# Patient Record
Sex: Male | Born: 1935 | Race: White | Hispanic: No | Marital: Married | Smoking: Former smoker
Health system: Southern US, Community
[De-identification: ages and names within clinical notes are randomized; demographics above are authoritative.]

## PROBLEM LIST (undated history)

## (undated) DIAGNOSIS — J449 Chronic obstructive pulmonary disease, unspecified: Secondary | ICD-10-CM

## (undated) DIAGNOSIS — I10 Essential (primary) hypertension: Secondary | ICD-10-CM

## (undated) DIAGNOSIS — E119 Type 2 diabetes mellitus without complications: Secondary | ICD-10-CM

## (undated) DIAGNOSIS — N189 Chronic kidney disease, unspecified: Secondary | ICD-10-CM

## (undated) DIAGNOSIS — M069 Rheumatoid arthritis, unspecified: Secondary | ICD-10-CM

## (undated) DIAGNOSIS — E785 Hyperlipidemia, unspecified: Secondary | ICD-10-CM

## (undated) HISTORY — DX: Essential (primary) hypertension: I10

## (undated) HISTORY — DX: Hyperlipidemia, unspecified: E78.5

## (undated) HISTORY — DX: Type 2 diabetes mellitus without complications: E11.9

## (undated) HISTORY — DX: Chronic obstructive pulmonary disease, unspecified: J44.9

## (undated) HISTORY — PX: TOE SURGERY: SHX1073

## (undated) HISTORY — DX: Chronic kidney disease, unspecified: N18.9

---

## 1999-01-28 ENCOUNTER — Emergency Department (HOSPITAL_COMMUNITY): Admission: EM | Admit: 1999-01-28 | Discharge: 1999-01-28 | Payer: Self-pay | Admitting: Emergency Medicine

## 1999-01-28 ENCOUNTER — Encounter: Payer: Self-pay | Admitting: Emergency Medicine

## 2005-04-29 ENCOUNTER — Emergency Department: Payer: Self-pay | Admitting: Emergency Medicine

## 2005-04-30 ENCOUNTER — Emergency Department: Payer: Self-pay | Admitting: Internal Medicine

## 2006-07-09 ENCOUNTER — Ambulatory Visit: Payer: Self-pay | Admitting: Internal Medicine

## 2006-08-07 ENCOUNTER — Ambulatory Visit: Payer: Self-pay | Admitting: Internal Medicine

## 2006-09-07 ENCOUNTER — Ambulatory Visit: Payer: Self-pay | Admitting: Internal Medicine

## 2009-10-08 ENCOUNTER — Emergency Department: Payer: Self-pay | Admitting: Emergency Medicine

## 2010-10-18 ENCOUNTER — Emergency Department: Payer: Self-pay | Admitting: Emergency Medicine

## 2010-12-07 ENCOUNTER — Emergency Department: Payer: Self-pay | Admitting: Emergency Medicine

## 2013-11-05 DIAGNOSIS — C439 Malignant melanoma of skin, unspecified: Secondary | ICD-10-CM | POA: Insufficient documentation

## 2014-01-27 DIAGNOSIS — N183 Chronic kidney disease, stage 3 unspecified: Secondary | ICD-10-CM | POA: Insufficient documentation

## 2014-08-11 ENCOUNTER — Telehealth: Payer: Self-pay | Admitting: Podiatry

## 2014-08-11 NOTE — Telephone Encounter (Signed)
Pt's wife called to schedule an appointment for patient due to having gout in left foot. I told the patient we could get them in the [facility] office tomorrow but he could not schedule due to having 2 other appointments already. I scheduled the patient to be seen Friday at 11:00am. Pt's wife wants to know if we could call in indomethacin 50mg  until he can come in on Friday. I stated that because he has not been seen in 4 years that he probably could not get the medication until he is seen. Pt's old chart [phone].

## 2014-08-12 NOTE — Telephone Encounter (Signed)
LEFT MESSAGE FOR PATIENT TO RETURN CALL NEED A PHARMACY NUMBER TO CALL IN PRESCRIPTION

## 2014-08-14 ENCOUNTER — Encounter: Payer: Self-pay | Admitting: Podiatry

## 2014-08-14 ENCOUNTER — Other Ambulatory Visit: Payer: Self-pay | Admitting: *Deleted

## 2014-08-14 ENCOUNTER — Ambulatory Visit (INDEPENDENT_AMBULATORY_CARE_PROVIDER_SITE_OTHER): Payer: PPO | Admitting: Podiatry

## 2014-08-14 ENCOUNTER — Ambulatory Visit (INDEPENDENT_AMBULATORY_CARE_PROVIDER_SITE_OTHER): Payer: PPO

## 2014-08-14 VITALS — BP 124/78 | HR 80 | Resp 16 | Ht 70.0 in | Wt 230.0 lb

## 2014-08-14 DIAGNOSIS — M10079 Idiopathic gout, unspecified ankle and foot: Secondary | ICD-10-CM

## 2014-08-14 DIAGNOSIS — IMO0002 Reserved for concepts with insufficient information to code with codable children: Secondary | ICD-10-CM | POA: Insufficient documentation

## 2014-08-14 DIAGNOSIS — M778 Other enthesopathies, not elsewhere classified: Secondary | ICD-10-CM

## 2014-08-14 DIAGNOSIS — J449 Chronic obstructive pulmonary disease, unspecified: Secondary | ICD-10-CM | POA: Insufficient documentation

## 2014-08-14 DIAGNOSIS — M7752 Other enthesopathy of left foot: Secondary | ICD-10-CM | POA: Diagnosis not present

## 2014-08-14 DIAGNOSIS — M779 Enthesopathy, unspecified: Secondary | ICD-10-CM

## 2014-08-14 DIAGNOSIS — I10 Essential (primary) hypertension: Secondary | ICD-10-CM | POA: Insufficient documentation

## 2014-08-14 DIAGNOSIS — M109 Gout, unspecified: Secondary | ICD-10-CM

## 2014-08-14 DIAGNOSIS — E1165 Type 2 diabetes mellitus with hyperglycemia: Secondary | ICD-10-CM | POA: Insufficient documentation

## 2014-08-14 DIAGNOSIS — E785 Hyperlipidemia, unspecified: Secondary | ICD-10-CM | POA: Insufficient documentation

## 2014-08-14 MED ORDER — INDOMETHACIN 50 MG PO CAPS
50.0000 mg | ORAL_CAPSULE | Freq: Two times a day (BID) | ORAL | Status: DC
Start: 2014-08-14 — End: 2015-03-14

## 2014-08-14 NOTE — Progress Notes (Signed)
   Subjective:    Patient ID: Isaac Stevens, male    DOB: 18-Apr-1935, 79 y.o.   MRN: [id number]  HPI I have gout. My left foot has been acting up. I am diabetic with a a1c 8.0  Need a refill on the indomethacin   Review of Systems     Objective:   Physical Exam: I have reviewed his past medical history medications allergies surgery social history and review of systems. Pulses are strongly palpable. Neurologic sensorium is intact per Semmes-Weinstein monofilament. Deep tendon reflexes are intact bilateral. Muscle strength +5 out of 5 dorsiflexor plantar flexors and inverters everters all intrinsic musculature is intact. Orthopedic evaluation demonstrates all joints distal to the ankle, full range of motion without crepitation. With exception of the first metatarsophalangeal joints bilateral right greater than left. He has pain on palpation and range of motion of the first metatarsophalangeal joint of the left foot which is mildly erythematous and edematous. 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 No other major osseous abnormalities affecting the forefoot. Cutaneous evaluation demonstrates supple well-hydrated cutis no erythema edema saline drainage or odor with exception of mild erythema overlying the first metatarsophalangeal joint and edema.        Assessment & Plan:  Assessment IVs mellitus without complications capsulitis possibly gouty arthritis secondary to history first metatarsophalangeal joint left.  Plan: Dispensed a prescription for indomethacin 25 mg 1 by mouth twice a day when necessary pain. Intra-articular injection dexamethasone after sterile Betadine skin prep 2 mg was injected hallux left. This left him asymptomatic

## 2014-08-16 ENCOUNTER — Encounter: Payer: Self-pay | Admitting: Podiatry

## 2014-08-25 ENCOUNTER — Encounter: Payer: Self-pay | Admitting: *Deleted

## 2014-08-25 NOTE — Progress Notes (Signed)
Received denial for Rx Indomethacin from insurance. Requested documentation. Sent over chart notes-will wait for response.

## 2014-08-27 NOTE — Progress Notes (Signed)
Received fax for approval for indomethacin 50 mg approved from 08/21/14 until 02/06/15

## 2014-11-20 NOTE — Progress Notes (Signed)
Entered in error

## 2015-02-18 DIAGNOSIS — M0579 Rheumatoid arthritis with rheumatoid factor of multiple sites without organ or systems involvement: Secondary | ICD-10-CM | POA: Diagnosis not present

## 2015-02-18 DIAGNOSIS — M79641 Pain in right hand: Secondary | ICD-10-CM | POA: Diagnosis not present

## 2015-02-18 DIAGNOSIS — Z79899 Other long term (current) drug therapy: Secondary | ICD-10-CM | POA: Diagnosis not present

## 2015-03-01 DIAGNOSIS — H2513 Age-related nuclear cataract, bilateral: Secondary | ICD-10-CM | POA: Diagnosis not present

## 2015-03-01 DIAGNOSIS — E119 Type 2 diabetes mellitus without complications: Secondary | ICD-10-CM | POA: Diagnosis not present

## 2015-03-04 DIAGNOSIS — E1165 Type 2 diabetes mellitus with hyperglycemia: Secondary | ICD-10-CM | POA: Diagnosis not present

## 2015-03-04 DIAGNOSIS — J449 Chronic obstructive pulmonary disease, unspecified: Secondary | ICD-10-CM | POA: Diagnosis not present

## 2015-03-04 DIAGNOSIS — I10 Essential (primary) hypertension: Secondary | ICD-10-CM | POA: Diagnosis not present

## 2015-03-04 DIAGNOSIS — E118 Type 2 diabetes mellitus with unspecified complications: Secondary | ICD-10-CM | POA: Diagnosis not present

## 2015-03-04 DIAGNOSIS — K121 Other forms of stomatitis: Secondary | ICD-10-CM | POA: Diagnosis not present

## 2015-03-08 DIAGNOSIS — B379 Candidiasis, unspecified: Secondary | ICD-10-CM | POA: Diagnosis not present

## 2015-03-08 DIAGNOSIS — R070 Pain in throat: Secondary | ICD-10-CM | POA: Diagnosis not present

## 2015-03-11 ENCOUNTER — Inpatient Hospital Stay: Payer: PPO

## 2015-03-11 ENCOUNTER — Inpatient Hospital Stay
Admission: AD | Admit: 2015-03-11 | Discharge: 2015-03-14 | DRG: 205 | Disposition: A | Discharge status: Home / Self Care | Payer: PPO | Source: Ambulatory Visit | Attending: Internal Medicine | Admitting: Internal Medicine

## 2015-03-11 DIAGNOSIS — R0902 Hypoxemia: Secondary | ICD-10-CM | POA: Diagnosis not present

## 2015-03-11 DIAGNOSIS — E119 Type 2 diabetes mellitus without complications: Secondary | ICD-10-CM | POA: Diagnosis not present

## 2015-03-11 DIAGNOSIS — E1165 Type 2 diabetes mellitus with hyperglycemia: Secondary | ICD-10-CM | POA: Diagnosis not present

## 2015-03-11 DIAGNOSIS — Z716 Tobacco abuse counseling: Secondary | ICD-10-CM | POA: Diagnosis not present

## 2015-03-11 DIAGNOSIS — F17220 Nicotine dependence, chewing tobacco, uncomplicated: Secondary | ICD-10-CM | POA: Diagnosis present

## 2015-03-11 DIAGNOSIS — J9601 Acute respiratory failure with hypoxia: Secondary | ICD-10-CM | POA: Diagnosis present

## 2015-03-11 DIAGNOSIS — M069 Rheumatoid arthritis, unspecified: Secondary | ICD-10-CM | POA: Diagnosis present

## 2015-03-11 DIAGNOSIS — N183 Chronic kidney disease, stage 3 (moderate): Secondary | ICD-10-CM | POA: Diagnosis present

## 2015-03-11 DIAGNOSIS — E785 Hyperlipidemia, unspecified: Secondary | ICD-10-CM | POA: Diagnosis not present

## 2015-03-11 DIAGNOSIS — I129 Hypertensive chronic kidney disease with stage 1 through stage 4 chronic kidney disease, or unspecified chronic kidney disease: Secondary | ICD-10-CM | POA: Diagnosis present

## 2015-03-11 DIAGNOSIS — Z7984 Long term (current) use of oral hypoglycemic drugs: Secondary | ICD-10-CM

## 2015-03-11 DIAGNOSIS — J441 Chronic obstructive pulmonary disease with (acute) exacerbation: Secondary | ICD-10-CM | POA: Diagnosis present

## 2015-03-11 DIAGNOSIS — R634 Abnormal weight loss: Secondary | ICD-10-CM | POA: Diagnosis not present

## 2015-03-11 DIAGNOSIS — T451X5A Adverse effect of antineoplastic and immunosuppressive drugs, initial encounter: Secondary | ICD-10-CM | POA: Diagnosis present

## 2015-03-11 DIAGNOSIS — J702 Acute drug-induced interstitial lung disorders: Principal | ICD-10-CM | POA: Diagnosis present

## 2015-03-11 DIAGNOSIS — B370 Candidal stomatitis: Secondary | ICD-10-CM | POA: Diagnosis not present

## 2015-03-11 DIAGNOSIS — Z6831 Body mass index (BMI) 31.0-31.9, adult: Secondary | ICD-10-CM | POA: Diagnosis not present

## 2015-03-11 DIAGNOSIS — Z8042 Family history of malignant neoplasm of prostate: Secondary | ICD-10-CM | POA: Diagnosis not present

## 2015-03-11 DIAGNOSIS — R0602 Shortness of breath: Secondary | ICD-10-CM | POA: Diagnosis not present

## 2015-03-11 DIAGNOSIS — J029 Acute pharyngitis, unspecified: Secondary | ICD-10-CM | POA: Diagnosis present

## 2015-03-11 DIAGNOSIS — Z888 Allergy status to other drugs, medicaments and biological substances status: Secondary | ICD-10-CM

## 2015-03-11 DIAGNOSIS — B379 Candidiasis, unspecified: Secondary | ICD-10-CM | POA: Diagnosis not present

## 2015-03-11 DIAGNOSIS — Z79899 Other long term (current) drug therapy: Secondary | ICD-10-CM

## 2015-03-11 DIAGNOSIS — M1990 Unspecified osteoarthritis, unspecified site: Secondary | ICD-10-CM | POA: Diagnosis present

## 2015-03-11 DIAGNOSIS — E1122 Type 2 diabetes mellitus with diabetic chronic kidney disease: Secondary | ICD-10-CM | POA: Diagnosis not present

## 2015-03-11 DIAGNOSIS — E118 Type 2 diabetes mellitus with unspecified complications: Secondary | ICD-10-CM | POA: Diagnosis not present

## 2015-03-11 DIAGNOSIS — I10 Essential (primary) hypertension: Secondary | ICD-10-CM | POA: Diagnosis not present

## 2015-03-11 DIAGNOSIS — Z7951 Long term (current) use of inhaled steroids: Secondary | ICD-10-CM

## 2015-03-11 DIAGNOSIS — J449 Chronic obstructive pulmonary disease, unspecified: Secondary | ICD-10-CM | POA: Diagnosis not present

## 2015-03-11 DIAGNOSIS — J189 Pneumonia, unspecified organism: Secondary | ICD-10-CM | POA: Diagnosis present

## 2015-03-11 DIAGNOSIS — R0600 Dyspnea, unspecified: Secondary | ICD-10-CM

## 2015-03-11 HISTORY — DX: Rheumatoid arthritis, unspecified: M06.9

## 2015-03-11 LAB — BASIC METABOLIC PANEL
Anion gap: 6 (ref 5–15)
BUN: 19 mg/dL (ref 6–20)
CO2: 25 mmol/L (ref 22–32)
CREATININE: 1.56 mg/dL — AB (ref 0.61–1.24)
Calcium: 9 mg/dL (ref 8.9–10.3)
Chloride: 103 mmol/L (ref 101–111)
GFR calc Af Amer: 48 mL/min — ABNORMAL LOW (ref >60)
GFR, EST NON AFRICAN AMERICAN: 41 mL/min — AB (ref >60)
GLUCOSE: 89 mg/dL (ref 65–99)
POTASSIUM: 4.6 mmol/L (ref 3.5–5.1)
SODIUM: 134 mmol/L — AB (ref 135–145)

## 2015-03-11 LAB — CBC
HCT: 33.1 % — ABNORMAL LOW (ref 40.0–52.0)
Hemoglobin: 11.2 g/dL — ABNORMAL LOW (ref 13.0–18.0)
MCH: 32.4 pg (ref 26.0–34.0)
MCHC: 33.8 g/dL (ref 32.0–36.0)
MCV: 95.7 fL (ref 80.0–100.0)
PLATELETS: 325 10*3/uL (ref 150–440)
RBC: 3.46 MIL/uL — AB (ref 4.40–5.90)
RDW: 15.6 % — ABNORMAL HIGH (ref 11.5–14.5)
WBC: 12 10*3/uL — ABNORMAL HIGH (ref 3.8–10.6)

## 2015-03-11 LAB — BLOOD GAS, ARTERIAL
ACID-BASE EXCESS: 0.8 mmol/L (ref 0.0–3.0)
Allens test (pass/fail): POSITIVE — AB
BICARBONATE: 24.2 meq/L (ref 21.0–28.0)
FIO2: 21
O2 SAT: 85.1 %
PATIENT TEMPERATURE: 37
PCO2 ART: 34 mmHg (ref 32.0–48.0)
PO2 ART: 47 mmHg — AB (ref 83.0–108.0)
pH, Arterial: 7.46 — ABNORMAL HIGH (ref 7.350–7.450)

## 2015-03-11 LAB — GLUCOSE, CAPILLARY
GLUCOSE-CAPILLARY: 71 mg/dL (ref 65–99)
GLUCOSE-CAPILLARY: 85 mg/dL (ref 65–99)

## 2015-03-11 MED ORDER — IPRATROPIUM-ALBUTEROL 0.5-2.5 (3) MG/3ML IN SOLN
3.0000 mL | RESPIRATORY_TRACT | Status: DC
  Administered 2015-03-11 – 2015-03-14 (×13): 3 mL via RESPIRATORY_TRACT
  Filled 2015-03-11 (×15): qty 3

## 2015-03-11 MED ORDER — METHYLPREDNISOLONE SODIUM SUCC 125 MG IJ SOLR
60.0000 mg | Freq: Three times a day (TID) | INTRAMUSCULAR | Status: DC
  Administered 2015-03-11 – 2015-03-12 (×2): 60 mg via INTRAVENOUS
  Filled 2015-03-11 (×3): qty 2

## 2015-03-11 MED ORDER — GLIMEPIRIDE 2 MG PO TABS
4.0000 mg | ORAL_TABLET | Freq: Two times a day (BID) | ORAL | Status: DC
  Administered 2015-03-12: 4 mg via ORAL
  Filled 2015-03-11 (×2): qty 2

## 2015-03-11 MED ORDER — METFORMIN HCL 500 MG PO TABS: 1000.0000 mg | ORAL_TABLET | Freq: Two times a day (BID) | ORAL | Status: DC

## 2015-03-11 MED ORDER — HYDROCODONE-ACETAMINOPHEN 5-325 MG PO TABS: 1.0000 | ORAL_TABLET | ORAL | Status: DC | PRN

## 2015-03-11 MED ORDER — ACETAMINOPHEN 650 MG RE SUPP: 650.0000 mg | Freq: Four times a day (QID) | RECTAL | Status: DC | PRN

## 2015-03-11 MED ORDER — ACETAMINOPHEN 325 MG PO TABS: 650.0000 mg | ORAL_TABLET | Freq: Four times a day (QID) | ORAL | Status: DC | PRN

## 2015-03-11 MED ORDER — ONDANSETRON HCL 4 MG/2ML IJ SOLN: 4.0000 mg | Freq: Four times a day (QID) | INTRAMUSCULAR | Status: DC | PRN

## 2015-03-11 MED ORDER — INSULIN ASPART 100 UNIT/ML ~~LOC~~ SOLN
0.0000 [IU] | Freq: Three times a day (TID) | SUBCUTANEOUS | Status: DC
  Administered 2015-03-12: 12:00:00 9 [IU] via SUBCUTANEOUS
  Administered 2015-03-12: 08:00:00 7 [IU] via SUBCUTANEOUS
  Administered 2015-03-12: 17:00:00 9 [IU] via SUBCUTANEOUS
  Administered 2015-03-13: 12:00:00 7 [IU] via SUBCUTANEOUS
  Administered 2015-03-13: 9 [IU] via SUBCUTANEOUS
  Administered 2015-03-13: 3 [IU] via SUBCUTANEOUS
  Administered 2015-03-14: 1 [IU] via SUBCUTANEOUS
  Filled 2015-03-11: qty 3
  Filled 2015-03-11: qty 9
  Filled 2015-03-11: qty 1
  Filled 2015-03-11 (×2): qty 7
  Filled 2015-03-11 (×2): qty 9

## 2015-03-11 MED ORDER — LINAGLIPTIN 5 MG PO TABS
5.0000 mg | ORAL_TABLET | Freq: Every day | ORAL | Status: DC
  Administered 2015-03-12 – 2015-03-14 (×3): 5 mg via ORAL
  Filled 2015-03-11 (×4): qty 1

## 2015-03-11 MED ORDER — AMLODIPINE BESYLATE 5 MG PO TABS
5.0000 mg | ORAL_TABLET | Freq: Every day | ORAL | Status: DC
  Administered 2015-03-12 – 2015-03-14 (×3): 5 mg via ORAL
  Filled 2015-03-11 (×4): qty 1

## 2015-03-11 MED ORDER — IOHEXOL 350 MG/ML SOLN
75.0000 mL | Freq: Once | INTRAVENOUS | Status: AC | PRN
  Administered 2015-03-11: 20:00:00 75 mL via INTRAVENOUS

## 2015-03-11 MED ORDER — BUDESONIDE-FORMOTEROL FUMARATE 160-4.5 MCG/ACT IN AERO
2.0000 | INHALATION_SPRAY | Freq: Two times a day (BID) | RESPIRATORY_TRACT | Status: DC
  Administered 2015-03-11 – 2015-03-14 (×6): 2 via RESPIRATORY_TRACT
  Filled 2015-03-11: qty 6

## 2015-03-11 MED ORDER — ENOXAPARIN SODIUM 40 MG/0.4ML ~~LOC~~ SOLN
40.0000 mg | SUBCUTANEOUS | Status: DC
  Administered 2015-03-12 – 2015-03-13 (×2): 40 mg via SUBCUTANEOUS
  Filled 2015-03-11 (×2): qty 0.4

## 2015-03-11 MED ORDER — INSULIN ASPART 100 UNIT/ML ~~LOC~~ SOLN
0.0000 [IU] | Freq: Every day | SUBCUTANEOUS | Status: DC
Start: 2015-03-11 — End: 2015-03-14
  Administered 2015-03-12: 23:00:00 5 [IU] via SUBCUTANEOUS
  Administered 2015-03-13: 22:00:00 3 [IU] via SUBCUTANEOUS
  Filled 2015-03-11: qty 3
  Filled 2015-03-11: qty 5

## 2015-03-11 MED ORDER — INDOMETHACIN 50 MG PO CAPS: 50.0000 mg | ORAL_CAPSULE | Freq: Two times a day (BID) | ORAL | Status: DC

## 2015-03-11 MED ORDER — ONDANSETRON HCL 4 MG PO TABS
4.0000 mg | ORAL_TABLET | Freq: Four times a day (QID) | ORAL | Status: DC | PRN
Start: 2015-03-11 — End: 2015-03-14

## 2015-03-11 NOTE — H&P (Signed)
[facility] at [place] NAME: Isaac Stevens    MR#:  [id number]  DATE OF BIRTH:  07-19-1935  DATE OF ADMISSION:  03/11/2015  PRIMARY CARE PHYSICIAN: Adin Hector, MD   REQUESTING/REFERRING PHYSICIAN: Dr. Ramonita Lab  CHIEF COMPLAINT:  No chief complaint on file. Dyspnea  HISTORY OF PRESENT ILLNESS:  Isaac Stevens  is a 80 y.o. male with a known history of CKD stage III, diabetes, hypertension and hyperlipidemia and recent diagnosis of arthritis and started on methotrexate about 2 months ago was sent in from PCPs office due to worsening hypoxia and dyspnea for 2 weeks now. The patient is very active at baseline, he has diagnosis of mild COPD, not on any home oxygen. Usually well controlled with his home inhalers. He was a former smoker and currently chews tobacco. He was recently diagnosed with arthritis and started on methotrexate in November 2016. Since then he was having issues with sore throat and sinus complaints. Over the last 2 weeks he's been having trouble breathing. Even minimal exertion was causing him to be extremely dyspneic. Denies any cough, no fevers or chills. No nausea or vomiting noted. He also was complaining of orthopnea and smothering at night time and was sleeping in a recliner. Symptoms have been worsening over the last couple of weeks, he remains confused and mostly sleeping during the daytime which is very unusual of him. Family brought pulse ox meter and check that his sats were 84% on room air and try to use nebulizer at home with not significant improvement. He was seen by his PCP in the office today and was sent in for concerns of dyspnea and worsening hypoxia.  PAST MEDICAL HISTORY:   Past Medical History  Diagnosis Date  . Hyperlipidemia   . Diabetes mellitus without complication ([place])   . Hypertension   . CAFL (chronic airflow limitation) ([facility])   . CKD (chronic kidney disease)   . Rheumatoid arthritis  ([place])     PAST SURGICAL HISTORY:   Past Surgical History  Procedure Laterality Date  . Toe surgery      right foot    SOCIAL HISTORY:   Social History  Substance Use Topics  . Smoking status: Former Research scientist (life sciences)  . Smokeless tobacco: Current User    Types: Chew     Comment: quit 20 years ago , chews tobacco  . Alcohol Use: No    FAMILY HISTORY:   Family History  Problem Relation Age of Onset  . Prostate cancer Father     DRUG ALLERGIES:   Allergies  Allergen Reactions  . Methotrexate Derivatives Shortness Of Breath  . Lisinopril Swelling  . Pseudoephedrine Hcl Other (See Comments)    confusion    REVIEW OF SYSTEMS:   Review of Systems  Constitutional: Positive for malaise/fatigue. Negative for fever, chills and weight loss.  HENT: Negative for ear discharge, ear pain, hearing loss, nosebleeds and tinnitus.   Eyes: Negative for blurred vision, double vision and photophobia.  Respiratory: Positive for shortness of breath. Negative for cough, hemoptysis and wheezing.   Cardiovascular: Negative for chest pain, palpitations, orthopnea and leg swelling.  Gastrointestinal: Negative for heartburn, nausea, vomiting, abdominal pain, diarrhea, constipation and melena.  Genitourinary: Negative for dysuria, urgency, frequency and hematuria.  Musculoskeletal: Negative for myalgias, back pain and neck pain.  Skin: Negative for rash.  Neurological: Negative for dizziness, tingling, tremors, sensory change, speech change, focal weakness and headaches.  Drowsy and confusion  Endo/Heme/Allergies: Does not bruise/bleed easily.  Psychiatric/Behavioral: Negative for depression.    MEDICATIONS AT HOME:   Prior to Admission medications   Medication Sig Start Date End Date Taking? Authorizing Provider  albuterol-ipratropium (COMBIVENT) 18-103 MCG/ACT inhaler Inhale into the lungs every 4 (four) hours.   Yes Historical Provider, MD  amLODipine (NORVASC) 5 MG tablet TAKE 1 TABLET BY  MOUTH EVERY DAY 04/23/14  Yes Historical Provider, MD  budesonide-formoterol (SYMBICORT) 160-4.5 MCG/ACT inhaler Inhale 2 puffs into the lungs 2 (two) times daily.   Yes Historical Provider, MD  EPIPEN 2-PAK 0.3 MG/0.3ML SOAJ injection See admin instructions. 07/31/14  Yes Historical Provider, MD  glimepiride (AMARYL) 4 MG tablet TAKE 1 TABLET BY MOUTH TWICE A DAY 04/29/14  Yes Historical Provider, MD  JANUVIA 100 MG tablet TAKE 1 TABLET (100 MG TOTAL) BY MOUTH ONCE DAILY. 06/03/14  Yes Historical Provider, MD  indomethacin (INDOCIN) 50 MG capsule Take 1 capsule (50 mg total) by mouth 2 (two) times daily with a meal. Patient not taking: Reported on 03/11/2015 08/14/14   Max T Hyatt, DPM  metFORMIN (GLUCOPHAGE) 1000 MG tablet Reported on 03/11/2015 04/23/14   Historical Provider, MD      VITAL SIGNS:  Blood pressure 135/64, pulse 87, temperature 99.4 F (37.4 C), temperature source Oral, resp. rate 20, SpO2 95 %.  PHYSICAL EXAMINATION:   Physical Exam  GENERAL:  80 y.o.-year-old patient lying in the bed with no acute distress.  EYES: Pupils equal, round, reactive to light and accommodation. No scleral icterus. Extraocular muscles intact.  HEENT: Head atraumatic, normocephalic. Oropharynx and nasopharynx clear.  NECK:  Supple, no jugular venous distention. No thyroid enlargement, no tenderness.  LUNGS: Normal breath sounds bilaterally, no wheezing, rales,rhonchi or crepitation. No use of accessory muscles of respiration. Decreased bibasilar breath sounds CARDIOVASCULAR: S1, S2 normal. No murmurs, rubs, or gallops.  ABDOMEN: Soft, nontender, nondistended. Bowel sounds present. No organomegaly or mass.  EXTREMITIES: No pedal edema, cyanosis, or clubbing.  NEUROLOGIC: Cranial nerves II through XII are intact. Muscle strength 5/5 in all extremities. Sensation intact. Gait not checked.  PSYCHIATRIC: The patient is alert and oriented x 3. Intermittent confusion noted. SKIN: No obvious rash, lesion, or  ulcer.   LABORATORY PANEL:   CBC No results for input(s): WBC, HGB, HCT, PLT in the last 168 hours. ------------------------------------------------------------------------------------------------------------------  Chemistries  No results for input(s): NA, K, CL, CO2, GLUCOSE, BUN, CREATININE, CALCIUM, MG, AST, ALT, ALKPHOS, BILITOT in the last 168 hours.  Invalid input(s): GFRCGP ------------------------------------------------------------------------------------------------------------------  Cardiac Enzymes No results for input(s): TROPONINI in the last 168 hours. ------------------------------------------------------------------------------------------------------------------  RADIOLOGY:  No results found.  EKG:   Orders placed or performed in visit on 12/07/10  . EKG 12-Lead  . EKG 12-Lead    IMPRESSION AND PLAN:   Isaac Stevens  is a 80 y.o. male with a known history of CKD stage III, diabetes, hypertension and hyperlipidemia and recent diagnosis of arthritis and started on methotrexate about 2 months ago was sent in from PCPs office due to worsening hypoxia and dyspnea for 2 weeks now.  #1 dyspnea with hypoxia-also complains of orthopnea going on for 2 weeks now. -Exam does not reveal any significant wheezing or crackles. No cough or fevers to indicate any bronchitis or pneumonia. -We'll get CT angiogram to rule out any pulmonary embolism. Not sure if this is a side effect from his methotrexate since all his symptoms started after using methotrexate. -We'll hold off methotrexate at this time -  2-D echocardiogram ordered especially with his symptoms of orthopnea. -ABG and supplemental oxygen as needed. -IV steroids added. - cont home inhalers and duonebs  #2 hypertension-continue amlodipine  #3 diabetes mellitus-check A1c. On sliding scale insulin. -Continue glimepiride and Januvia. Metformin held due to CT with contrast today.  #4 DVT prophylaxis-on  Lovenox   All the records are reviewed and case discussed with ED provider. Management plans discussed with the patient, family and they are in agreement.  CODE STATUS: Full Code  TOTAL TIME TAKING CARE OF THIS PATIENT: 50 minutes.    Gladstone Lighter M.D on 03/11/2015 at 5:12 PM  Between 7am to 6pm - Pager - [phone]  After 6pm go to [email or web address] - password [email or web address] [facility]  [place] [facility]  Office  [phone]  CC: Primary care physician; Adin Hector, MD

## 2015-03-12 ENCOUNTER — Inpatient Hospital Stay (HOSPITAL_COMMUNITY)
Admission: AD | Admit: 2015-03-12 | Discharge: 2015-03-12 | Disposition: A | Discharge status: Home / Self Care | Payer: PPO | Source: Ambulatory Visit | Attending: Internal Medicine | Admitting: Internal Medicine

## 2015-03-12 DIAGNOSIS — R0600 Dyspnea, unspecified: Secondary | ICD-10-CM | POA: Diagnosis not present

## 2015-03-12 LAB — BASIC METABOLIC PANEL
Anion gap: 12 (ref 5–15)
BUN: 22 mg/dL — AB (ref 6–20)
CO2: 22 mmol/L (ref 22–32)
Calcium: 9 mg/dL (ref 8.9–10.3)
Chloride: 102 mmol/L (ref 101–111)
Creatinine, Ser: 1.61 mg/dL — ABNORMAL HIGH (ref 0.61–1.24)
GFR, EST AFRICAN AMERICAN: 46 mL/min — AB (ref >60)
GFR, EST NON AFRICAN AMERICAN: 40 mL/min — AB (ref >60)
Glucose, Bld: 291 mg/dL — ABNORMAL HIGH (ref 65–99)
POTASSIUM: 4.4 mmol/L (ref 3.5–5.1)
SODIUM: 136 mmol/L (ref 135–145)

## 2015-03-12 LAB — HEMOGLOBIN A1C: Hgb A1c MFr Bld: 7.5 % — ABNORMAL HIGH (ref 4.0–6.0)

## 2015-03-12 LAB — CBC
HEMATOCRIT: 33.8 % — AB (ref 40.0–52.0)
Hemoglobin: 11.3 g/dL — ABNORMAL LOW (ref 13.0–18.0)
MCH: 32.7 pg (ref 26.0–34.0)
MCHC: 33.4 g/dL (ref 32.0–36.0)
MCV: 97.9 fL (ref 80.0–100.0)
PLATELETS: 283 10*3/uL (ref 150–440)
RBC: 3.45 MIL/uL — AB (ref 4.40–5.90)
RDW: 15.3 % — AB (ref 11.5–14.5)
WBC: 7.4 10*3/uL (ref 3.8–10.6)

## 2015-03-12 LAB — GLUCOSE, CAPILLARY
GLUCOSE-CAPILLARY: 343 mg/dL — AB (ref 65–99)
Glucose-Capillary: 284 mg/dL — ABNORMAL HIGH (ref 65–99)
Glucose-Capillary: 386 mg/dL — ABNORMAL HIGH (ref 65–99)
Glucose-Capillary: 393 mg/dL — ABNORMAL HIGH (ref 65–99)
Glucose-Capillary: 364 mg/dL — ABNORMAL HIGH (ref 65–99)

## 2015-03-12 MED ORDER — INSULIN GLARGINE 100 UNIT/ML ~~LOC~~ SOLN
10.0000 [IU] | Freq: Every day | SUBCUTANEOUS | Status: DC
Start: 2015-03-12 — End: 2015-03-13
  Administered 2015-03-12: 23:00:00 10 [IU] via SUBCUTANEOUS
  Filled 2015-03-12: qty 0.1

## 2015-03-12 MED ORDER — METHYLPREDNISOLONE SODIUM SUCC 125 MG IJ SOLR
60.0000 mg | INTRAMUSCULAR | Status: DC
  Administered 2015-03-12: 13:00:00 60 mg via INTRAVENOUS
  Filled 2015-03-12: qty 2

## 2015-03-12 MED ORDER — INSULIN GLARGINE 100 UNIT/ML ~~LOC~~ SOLN
15.0000 [IU] | Freq: Every day | SUBCUTANEOUS | Status: DC
  Administered 2015-03-12: 23:00:00 10 [IU] via SUBCUTANEOUS
  Filled 2015-03-12: qty 0.15

## 2015-03-12 MED ORDER — LEVOFLOXACIN 500 MG PO TABS
250.0000 mg | ORAL_TABLET | Freq: Every day | ORAL | Status: DC
  Administered 2015-03-13 – 2015-03-14 (×2): 250 mg via ORAL
  Filled 2015-03-12 (×2): qty 1

## 2015-03-12 MED ORDER — LEVOFLOXACIN 500 MG PO TABS
500.0000 mg | ORAL_TABLET | Freq: Once | ORAL | Status: AC
  Administered 2015-03-12: 500 mg via ORAL
  Filled 2015-03-12: qty 1

## 2015-03-12 NOTE — Care Management (Signed)
Admitted to [facility] with the diagnosis of COPD. Lives with wife, Britt Boozer [phone] or [phone]). Last seen Dr. Caryl Comes in the office yesterday. No home Health. No skilled facility. No home oxygen. Uses no aids for ambulation. Raised toilet seat, bedside commode, and rolling walker in the home, if needed. Takes care of all basic and instrumental needs himself, drives. No falls, Decreased appetite.  Wife/family will transport. Shelbie Ammons RN MSN CCM Care Management [phone]

## 2015-03-12 NOTE — Progress Notes (Signed)
[place] at [place] NAME: Isaac Stevens    MR#:  [id number]  DATE OF BIRTH:  1935/07/25  SUBJECTIVE:  CHIEF COMPLAINT:  No chief complaint on file.  Admitted from home due to persistent hypoxia.  - now requiring 4 L oxygen. CT chest negative for PE. 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 Improved dyspnea.  REVIEW OF SYSTEMS:  Review of Systems  Constitutional: Negative for fever and chills.  Respiratory: Positive for cough and shortness of breath. Negative for wheezing.   Cardiovascular: Negative for chest pain and palpitations.  Gastrointestinal: Negative for nausea, vomiting, abdominal pain, diarrhea and constipation.  Genitourinary: Negative for dysuria.  Neurological: Positive for weakness. Negative for dizziness, sensory change, speech change, focal weakness, seizures and headaches.  Psychiatric/Behavioral: Negative for depression.    DRUG ALLERGIES:   Allergies  Allergen Reactions  . Methotrexate Derivatives Shortness Of Breath  . Lisinopril Swelling  . Pseudoephedrine Hcl Other (See Comments)    confusion    VITALS:  Blood pressure 111/58, pulse 73, temperature 98.2 F (36.8 C), temperature source Oral, resp. rate 17, height 5\' 10"  (1.778 m), weight 98.657 kg (217 lb 8 oz), SpO2 96 %.  PHYSICAL EXAMINATION:  Physical Exam  GENERAL: 80 y.o.-year-old patient lying in the bed with no acute distress.  EYES: Pupils equal, round, reactive to light and accommodation. No scleral icterus. Extraocular muscles intact.  HEENT: Head atraumatic, normocephalic. Oropharynx and nasopharynx clear.  NECK: Supple, no jugular venous distention. No thyroid enlargement, no tenderness.  LUNGS: Normal breath sounds bilaterally, no wheezing, rales,rhonchi or crepitation. No use of accessory muscles of respiration. Decreased bibasilar breath sounds CARDIOVASCULAR: S1, S2  normal. No murmurs, rubs, or gallops.  ABDOMEN: Soft, nontender, nondistended. Bowel sounds present. No organomegaly or mass.  EXTREMITIES: No pedal edema, cyanosis, or clubbing.  NEUROLOGIC: Cranial nerves II through XII are intact. Muscle strength 5/5 in all extremities. Sensation intact. Gait not checked.  PSYCHIATRIC: The patient is alert and oriented x 3. SKIN: No obvious rash, lesion, or ulcer.    LABORATORY PANEL:   CBC  Recent Labs Lab 03/12/15 0609  WBC 7.4  HGB 11.3*  HCT 33.8*  PLT 283   ------------------------------------------------------------------------------------------------------------------  Chemistries   Recent Labs Lab 03/12/15 0609  NA 136  K 4.4  CL 102  CO2 22  GLUCOSE 291*  BUN 22*  CREATININE 1.61*  CALCIUM 9.0   ------------------------------------------------------------------------------------------------------------------  Cardiac Enzymes No results for input(s): TROPONINI in the last 168 hours. ------------------------------------------------------------------------------------------------------------------  RADIOLOGY:  Dg Chest 2 View  03/11/2015  CLINICAL DATA:  Worsening shortness of Breath EXAM: CHEST  2 VIEW COMPARISON:  None FINDINGS: The heart size and mediastinal contours are within normal limits. Both lungs are clear. The visualized skeletal structures are unremarkable. IMPRESSION: No active cardiopulmonary disease. Electronically Signed   By: Kerby Moors M.D.   On: 03/11/2015 17:35   Ct Angio Chest Pe W/cm &/or Wo Cm  03/11/2015  CLINICAL DATA:  80 y.o. male with a known history of CKD stage III, diabetes, hypertension and hyperlipidemia and recent diagnosis of arthritis and started on methotrexate about 2 months ago was sent in from PCPs office due to worsening hypoxia and dyspnea for 2 weeks now. EXAM: CT ANGIOGRAPHY CHEST WITH CONTRAST TECHNIQUE: Multidetector CT imaging of the chest was performed using the standard  protocol during bolus administration of intravenous contrast. Multiplanar CT image reconstructions and MIPs were obtained to evaluate  the vascular anatomy. CONTRAST:  79mL OMNIPAQUE IOHEXOL 350 MG/ML SOLN COMPARISON:  Current chest radiographs FINDINGS: Angiographic study: No evidence of a pulmonary embolus. Aorta demonstrates mild atherosclerotic calcifications. Aorta is mildly dilated with the ascending portion measuring 4 mm in diameter. Common origin of the innominate artery and left common carotid artery from the aortic arch. No aortic dissection. Neck base and axilla: No mass or adenopathy. Thyroid is unremarkable. Mediastinum and hila: Heart normal in size and configuration. Moderate coronary artery calcifications. There are several prominent lymph nodes, largest a precarinal node measuring 1 cm in short axis. There is enlarged right infrahilar node measuring 13 mm in short axis. Lungs and pleura: Patchy areas of ground-glass opacity are noted bilaterally, more prominent on the right than the left. There is mild interstitial thickening. Minor centrilobular and paraseptal emphysema is noted in the upper lobes, greater on the right. No pleural effusion. No pneumothorax. Limited upper abdomen: Liver shows central volume loss with some surface nodularity consistent with cirrhosis. No liver mass or focal lesion. Visualized spleen is mildly enlarged measuring 13 cm in short axis. Musculoskeletal:  No osteoblastic or osteolytic lesions. Review of the MIP images confirms the above findings. IMPRESSION: 1. No evidence of a pulmonary embolism. 2. Bilateral patchy areas of ground-glass lung opacity are noted, right greater than left. This could reflect pneumonia, particularly atypical pneumonia. Given the history, drug toxicity should be considered. Electronically Signed   By: Lajean Manes M.D.   On: 03/11/2015 20:28    EKG:   Orders placed or performed in visit on 12/07/10  . EKG 12-Lead  . EKG 12-Lead     ASSESSMENT AND PLAN:   Isaac Stevens is a 80 y.o. male with a known history of CKD stage III, diabetes, hypertension and hyperlipidemia and recent diagnosis of arthritis and started on methotrexate about 2 months ago was sent in from PCPs office due to worsening hypoxia and dyspnea for 2 weeks now.  #1 acute hypoxic respiratory failure-  currently on 4 L oxygen. Patient did not have any home oxygen prior to this before. -CT of the chest-with no pulmonary embolism. However bilateral patchy areas of groundglass opacities noted.. Could be pneumonitis versus  side effect from his methotrexate since all his symptoms started after using methotrexate. -Discontinued methotrexate at this time -2-D echocardiogram ordered especially with his symptoms of orthopnea. -ABG with hypoxia but no CO2 retention. Started on IV steroids, DuoNeb's -Added Levaquin today. Pulmonary consult.  #2 hypertension-continue amlodipine  #3 diabetes mellitus- A1c 7.5. On sliding scale insulin. -Continue glimepiride and Januvia. Metformin held due to CT with contrast yesterday. -Sugars are elevated due to being on steroids. Added Lantus at bedtime  #4 DVT prophylaxis-on Lovenox   Appreciate physical therapy consult. No PT needs identified.   All the records are reviewed and case discussed with Care Management/Social Workerr. Management plans discussed with the patient, family and they are in agreement.  CODE STATUS: Full Code  TOTAL TIME TAKING CARE OF THIS PATIENT: 37 minutes.   POSSIBLE D/C IN 1-2 DAYS, DEPENDING ON CLINICAL CONDITION.   Annella Prowell M.D on 03/12/2015 at 12:40 PM  Between 7am to 6pm - Pager - [phone]  After 6pm go to [email or web address] - password [email or web address] [facility]  [place] [facility]  Office  [phone]  CC: Primary care physician; Adin Hector, MD

## 2015-03-12 NOTE — Progress Notes (Signed)
Initial Nutrition Assessment   INTERVENTION:   Meals and Snacks: Cater to patient preferences on current diet order Medical Food Supplement Therapy: pt not interested in supplement drinks currently and po intake improved per report. If po intake poor on follow, will readdress.   NUTRITION DIAGNOSIS:   Inadequate oral intake related to poor appetite, acute illness as evidenced by per patient/family report.  GOAL:   Patient will meet greater than or equal to 90% of their needs  MONITOR:    (Energy Intake, Anthropometrics, Electrolyte and renal Profile, Glucose Profile)  REASON FOR ASSESSMENT:   Consult Poor PO  ASSESSMENT:   Pt admitted with acute COPD exacerbation.  Pt with h/o DM, CKD and HTN.  Past Medical History  Diagnosis Date  . Hyperlipidemia   . Diabetes mellitus without complication ([place])   . Hypertension   . CAFL (chronic airflow limitation) ([facility])   . CKD (chronic kidney disease)   . Rheumatoid arthritis ([place])      Diet Order:  Diet heart healthy/carb modified Room service appropriate?: Yes; Fluid consistency:: Thin   Current Nutrition: Pt reports eating a very good lunch today on visit. Pt ate 100% of pot roast, mashed potatoes, carrots, applesauce and tea.  Food/Nutrition-Related History: Pt reports poor appetite 2 weeks PTA. Pt reports having a very sore throat and was even having difficulty swallowing water. Pt reports usually having a good appetite. No supplement drinks usually   Scheduled Medications:  . amLODipine  5 mg Oral Daily  . budesonide-formoterol  2 puff Inhalation BID  . enoxaparin (LOVENOX) injection  40 mg Subcutaneous Q24H  . glimepiride  4 mg Oral BID  . insulin aspart  0-5 Units Subcutaneous QHS  . insulin aspart  0-9 Units Subcutaneous TID WC  . insulin glargine  15 Units Subcutaneous QHS  . ipratropium-albuterol  3 mL Nebulization Q4H  . [START ON 03/13/2015] levofloxacin  250 mg Oral Daily  . linagliptin  5 mg Oral Daily  .  methylPREDNISolone (SOLU-MEDROL) injection  60 mg Intravenous Q24H     Electrolyte/Renal Profile and Glucose Profile:   Recent Labs Lab 03/11/15 1715 03/12/15 0609  NA 134* 136  K 4.6 4.4  CL 103 102  CO2 25 22  BUN 19 22*  CREATININE 1.56* 1.61*  CALCIUM 9.0 9.0  GLUCOSE 89 291*   Protein Profile: No results for input(s): ALBUMIN in the last 168 hours.  Gastrointestinal Profile: Last BM:  03/10/2015    Weight Change: Pt reports probably 10-20lbs weight loss in the past 1 months. Per [facility] weight encounters 6% weight loss in 6 months    Height:   Ht Readings from Last 1 Encounters:  03/11/15 5\' 10"  (1.778 m)    Weight:   Wt Readings from Last 1 Encounters:  03/11/15 217 lb 8 oz (98.657 kg)    Wt Readings from Last 10 Encounters:  03/11/15 217 lb 8 oz (98.657 kg)  08/14/14 230 lb (104.327 kg)      BMI:  Body mass index is 31.21 kg/(m^2).   EDUCATION NEEDS:   No education needs identified at this time   [place], RD, LDN Pager [phone] Weekend/On-Call Pager [phone]

## 2015-03-12 NOTE — Evaluation (Signed)
Physical Therapy Evaluation Patient Details Name: Isaac Stevens MRN: [id number] DOB: [birth year] Today's Date: 03/12/2015   History of Present Illness  Pt is admitted for complaints of dyspnea with hypoxia x 2 weeks. Pt now with COPD exacerbation. Pt with history of CKD stage 3, DM, HTN, arthritis.   Clinical Impression  Pt is a pleasant 80 year old male who was admitted for COPD exacerbation.  Pt demonstrates all bed mobility/transfers/ambulation at baseline level without AD. Ambulation performed with/without O2 with sats documented. Pt does not require any further PT needs at this time. Pt encouraged to stay active with RN in room. Pt will be dc in house and does not require follow up. RN aware. Will dc current orders.      Follow Up Recommendations No PT follow up    Equipment Recommendations       Recommendations for Other Services       Precautions / Restrictions Precautions Precautions: Fall Restrictions Weight Bearing Restrictions: No      Mobility  Bed Mobility Overal bed mobility: Independent             General bed mobility comments: safe technique performed  Transfers Overall transfer level: Independent Equipment used: None             General transfer comment: safe technique performed without AD required  Ambulation/Gait Ambulation/Gait assistance: Supervision Ambulation Distance (Feet): 190 Feet Assistive device: None Gait Pattern/deviations: WFL(Within Functional Limits)     General Gait Details: fast gait speed noted with pt ambulating 10' walk test in 4 seconds. Pt cued for slowing down as he has slight unsteadiness. Pt ambulated on room air  with sats decreasing to 86%. Pt reports moderate SOB symptoms with ambulation.  Stairs            Wheelchair Mobility    Modified Rankin (Stroke Patients Only)       Balance Overall balance assessment: Independent                                           Pertinent  Vitals/Pain Pain Assessment: No/denies pain    Home Living Family/patient expects to be discharged to:: Private residence Living Arrangements: Spouse/significant other;Children Available Help at Discharge: Family Type of Home: House       Home Layout: Able to live on main level with bedroom/bathroom Home Equipment: Gilford Rile - 2 wheels;Bedside commode (raised toliet)      Prior Function Level of Independence: Independent               Hand Dominance        Extremity/Trunk Assessment   Upper Extremity Assessment: Overall WFL for tasks assessed           Lower Extremity Assessment: Overall WFL for tasks assessed         Communication   Communication: No difficulties  Cognition Arousal/Alertness: Awake/alert Behavior During Therapy: WFL for tasks assessed/performed Overall Cognitive Status: Within Functional Limits for tasks assessed                      General Comments      Exercises Other Exercises Other Exercises: Further ambulation performed with no AD while on 3L of O2. Pt with O2 sats at 90% with exertion while on 3L of O2. Reciprocal gait pattern performed and pt demonstrates slower gait speed with improved safety.  Assessment/Plan    PT Assessment Patent does not need any further PT services  PT Diagnosis     PT Problem List    PT Treatment Interventions     PT Goals (Current goals can be found in the Care Plan section) Acute Rehab PT Goals Patient Stated Goal: to go home PT Goal Formulation: All assessment and education complete, DC therapy Time For Goal Achievement: 03/25/2015 Potential to Achieve Goals: Good    Frequency     Barriers to discharge        Co-evaluation               End of Session Equipment Utilized During Treatment: Oxygen Activity Tolerance: Patient tolerated treatment well Patient left: in bed Nurse Communication: Mobility status         Time: [id number] PT Time Calculation (min) (ACUTE  ONLY): 14 min   Charges:   PT Evaluation $PT Eval Low Complexity: 1 Procedure PT Treatments $Gait Training: 8-22 mins   PT G Codes:        [name] 2015/03/25, 11:57 AM  Greggory Stallion, PT, DPT [phone]

## 2015-03-12 NOTE — Progress Notes (Signed)
*  PRELIMINARY RESULTS* Echocardiogram 2D Echocardiogram has been performed.  Isaac Stevens 03/12/2015, 7:43 PM

## 2015-03-12 NOTE — Progress Notes (Signed)
Pharmacy Antibiotic Note  Isaac Stevens is a 80 y.o. male admitted on 03/11/2015 with pneumonia and CAP.  Pharmacy has been consulted for levofloxacin dosing.  Plan: The dose of levofloxacin will be adjusted to 500 mg po once followed by 250 mg po daily based on renal function.  Height: 5\' 10"  (177.8 cm) Weight: 217 lb 8 oz (98.657 kg) IBW/kg (Calculated) : 73  Temp (24hrs), Avg:98.6 F (37 C), Min:98.2 F (36.8 C), Max:99.4 F (37.4 C)   Recent Labs Lab 03/11/15 1715 03/12/15 0609  WBC 12.0* 7.4  CREATININE 1.56* 1.61*    Estimated Creatinine Clearance: 45.3 mL/min (by C-G formula based on Cr of 1.61).    Allergies  Allergen Reactions  . Methotrexate Derivatives Shortness Of Breath  . Lisinopril Swelling  . Pseudoephedrine Hcl Other (See Comments)    confusion    Antimicrobials this admission: Anti-infectives    Start     Dose/Rate Route Frequency Ordered Stop   03/13/15 1000  levofloxacin (LEVAQUIN) tablet 250 mg     250 mg Oral Daily 03/12/15 0903     03/12/15 0930  levofloxacin (LEVAQUIN) tablet 500 mg     500 mg Oral  Once 03/12/15 [id number]        Dose adjustments this admission: Levolfoxacin 500 mg po once followed by 250 mg po daily.  Follow closely for CrCl>50 mL/min to transition to 500 mg po daily.   Microbiology results: No results found for this or any previous visit.  Thank you for allowing pharmacy to be a part of this patient's care.  Allis Quirarte G 03/12/2015 9:03 AM

## 2015-03-13 DIAGNOSIS — J189 Pneumonia, unspecified organism: Secondary | ICD-10-CM

## 2015-03-13 DIAGNOSIS — R0600 Dyspnea, unspecified: Secondary | ICD-10-CM

## 2015-03-13 DIAGNOSIS — B370 Candidal stomatitis: Secondary | ICD-10-CM | POA: Diagnosis not present

## 2015-03-13 DIAGNOSIS — J441 Chronic obstructive pulmonary disease with (acute) exacerbation: Secondary | ICD-10-CM

## 2015-03-13 DIAGNOSIS — Z716 Tobacco abuse counseling: Secondary | ICD-10-CM | POA: Diagnosis not present

## 2015-03-13 LAB — GLUCOSE, CAPILLARY
GLUCOSE-CAPILLARY: 374 mg/dL — AB (ref 65–99)
GLUCOSE-CAPILLARY: 394 mg/dL — AB (ref 65–99)
GLUCOSE-CAPILLARY: 408 mg/dL — AB (ref 65–99)
Glucose-Capillary: 233 mg/dL — ABNORMAL HIGH (ref 65–99)
Glucose-Capillary: 283 mg/dL — ABNORMAL HIGH (ref 65–99)
Glucose-Capillary: 340 mg/dL — ABNORMAL HIGH (ref 65–99)
Glucose-Capillary: 352 mg/dL — ABNORMAL HIGH (ref 65–99)

## 2015-03-13 MED ORDER — PREDNISONE 50 MG PO TABS
50.0000 mg | ORAL_TABLET | Freq: Every day | ORAL | Status: DC
  Administered 2015-03-13 – 2015-03-14 (×2): 50 mg via ORAL
  Filled 2015-03-13 (×2): qty 1

## 2015-03-13 MED ORDER — METFORMIN HCL 500 MG PO TABS
1000.0000 mg | ORAL_TABLET | Freq: Two times a day (BID) | ORAL | Status: DC
  Administered 2015-03-13 – 2015-03-14 (×2): 1000 mg via ORAL
  Filled 2015-03-13 (×2): qty 2

## 2015-03-13 MED ORDER — ZOLPIDEM TARTRATE 5 MG PO TABS
5.0000 mg | ORAL_TABLET | Freq: Every evening | ORAL | Status: DC | PRN
  Administered 2015-03-14: 01:00:00 5 mg via ORAL
  Filled 2015-03-13: qty 1

## 2015-03-13 MED ORDER — INSULIN GLARGINE 100 UNIT/ML ~~LOC~~ SOLN
18.0000 [IU] | Freq: Every day | SUBCUTANEOUS | Status: DC
  Administered 2015-03-13: 22:00:00 18 [IU] via SUBCUTANEOUS
  Filled 2015-03-13 (×2): qty 0.18

## 2015-03-13 NOTE — Consult Note (Signed)
PULMONARY / CRITICAL CARE MEDICINE   Name: Isaac Stevens MRN: [id number] DOB: 07-Apr-1935    ADMISSION DATE:  03/11/2015 CONSULTATION DATE:  03/12/15 REFERRING MD :  Dr. Tressia Miners   CHIEF COMPLAINT:    SOB Reason for consult - dyspnea, pneumonitis   HISTORY OF PRESENT ILLNESS  80 year old male with past medical history of stage III kidney disease, diabetes, hypertension, rheumatoid arthritis, hyperlipidemia, admitted for significant dyspnea and pneumonitis. Reason for consult dyspnea and pneumonitis. History per patient review of chart. Patient states that about 2 months ago he was started on methotrexate by his rheumatologist for history of throat arthritis, further history reveals that he was on methotrexate and prednisone weaning doses. About 2-3 weeks ago he started to feel mild shortness of breath especially with exertion, in the last week he started having subjective fevers, chills, weakness, weight loss, worsening shortness of breath with lying down. He presented to the hospital with the symptoms that was again worse in the last week, CT scan of chest showed bilateral groundglass opacities, right greater than left, consistent with pneumonitis. Patient states pets at home include outside cats, he is a former smoker (20+ years, about 2 packs per day, quit 20 years ago). Also stated over the past week he has had significant sore throat, review of chart shows that he has been seen by his primary care physician and by ENT, diagnosed with thrush and started on a mouthwash for it.  Sick contacts include his wife who had a possible upper respiratory viral illness. Today he states since starting steroids yesterday and stopping methotrexate he is significantly improved, he is requiring about 3-4 L to maintain oxygen saturations greater than 90%. Patient also stated that he chews tobacco daily    SIGNIFICANT EVENTS      PAST MEDICAL HISTORY    :  Past Medical History  Diagnosis Date  .  Hyperlipidemia   . Diabetes mellitus without complication ([facility])   . Hypertension   . CAFL (chronic airflow limitation) ([facility])   . CKD (chronic kidney disease)   . Rheumatoid arthritis [facility])    Past Surgical History  Procedure Laterality Date  . Toe surgery      right foot   Prior to Admission medications   Medication Sig Start Date End Date Taking? Authorizing Provider  albuterol-ipratropium (COMBIVENT) 18-103 MCG/ACT inhaler Inhale into the lungs every 4 (four) hours.   Yes Historical Provider, MD  amLODipine (NORVASC) 5 MG tablet TAKE 1 TABLET BY MOUTH EVERY DAY 04/23/14  Yes Historical Provider, MD  budesonide-formoterol (SYMBICORT) 160-4.5 MCG/ACT inhaler Inhale 2 puffs into the lungs 2 (two) times daily.   Yes Historical Provider, MD  EPIPEN 2-PAK 0.3 MG/0.3ML SOAJ injection See admin instructions. 07/31/14  Yes Historical Provider, MD  glimepiride (AMARYL) 4 MG tablet TAKE 1 TABLET BY MOUTH TWICE A DAY 04/29/14  Yes Historical Provider, MD  JANUVIA 100 MG tablet TAKE 1 TABLET (100 MG TOTAL) BY MOUTH ONCE DAILY. 06/03/14  Yes Historical Provider, MD  indomethacin (INDOCIN) 50 MG capsule Take 1 capsule (50 mg total) by mouth 2 (two) times daily with a meal. Patient not taking: Reported on 03/11/2015 08/14/14   Max T Hyatt, DPM  metFORMIN (GLUCOPHAGE) 1000 MG tablet Reported on 03/11/2015 04/23/14   Historical Provider, MD   Allergies  Allergen Reactions  . Methotrexate Derivatives Shortness Of Breath  . Lisinopril Swelling  . Pseudoephedrine Hcl Other (See Comments)    confusion     FAMILY HISTORY   Family  History  Problem Relation Age of Onset  . Prostate cancer Father       SOCIAL HISTORY    reports that he has quit smoking. His smokeless tobacco use includes Chew. He reports that he does not drink alcohol or use illicit drugs.  Review of Systems  Constitutional: Positive for chills, weight loss and malaise/fatigue.  HENT: Positive for sore throat. Negative for congestion  and hearing loss.   Eyes: Negative for blurred vision and double vision.  Respiratory: Positive for cough, shortness of breath and wheezing.   Cardiovascular: Negative for chest pain.  Gastrointestinal: Negative for heartburn, nausea, vomiting and abdominal pain.  Genitourinary: Negative for dysuria and urgency.  Musculoskeletal: Positive for joint pain. Negative for myalgias.       Mild chronic jt pain from RA  Skin: Negative for itching and rash.  Neurological: Positive for weakness. Negative for dizziness and headaches.  Endo/Heme/Allergies: Does not bruise/bleed easily.  Psychiatric/Behavioral: Negative for depression.      VITAL SIGNS    Temp:  [97.5 F (36.4 C)-98 F (36.7 C)] 97.5 F (36.4 C) (02/04 0513) Pulse Rate:  [82-90] 82 (02/04 0513) Resp:  [18-19] 18 (02/04 0513) BP: (126-137)/(60-63) 126/60 mmHg (02/04 0513) SpO2:  [93 %-98 %] 93 % (02/04 0753) HEMODYNAMICS:   VENTILATOR SETTINGS:   INTAKE / OUTPUT: No intake or output data in the 24 hours ending 03/13/15 1037     PHYSICAL EXAM   Physical Exam  Constitutional: He is oriented to person, place, and time. He appears well-developed and well-nourished.  HENT:  Head: Normocephalic and atraumatic.  Right Ear: External ear normal.  Left Ear: External ear normal.  Nose: Nose normal.  Upper palate exudate, white thrush  Eyes: Conjunctivae and EOM are normal.  Neck: Normal range of motion. Neck supple.  Cardiovascular: Normal rate, regular rhythm, normal heart sounds and intact distal pulses.   Pulmonary/Chest: Effort normal and breath sounds normal. No respiratory distress. He has no wheezes. He exhibits no tenderness.  Abdominal: Soft. Bowel sounds are normal.  Musculoskeletal: Normal range of motion. He exhibits no edema.  Neurological: He is alert and oriented to person, place, and time. He has normal reflexes.  Skin: Skin is warm and dry.  Psychiatric: He has a normal mood and affect.  Nursing note  and vitals reviewed.      LABS   LABS:  CBC  Recent Labs Lab 03/11/15 1715 03/12/15 0609  WBC 12.0* 7.4  HGB 11.2* 11.3*  HCT 33.1* 33.8*  PLT 325 283   Coag's No results for input(s): APTT, INR in the last 168 hours. BMET  Recent Labs Lab 03/11/15 1715 03/12/15 0609  NA 134* 136  K 4.6 4.4  CL 103 102  CO2 25 22  BUN 19 22*  CREATININE 1.56* 1.61*  GLUCOSE 89 291*   Electrolytes  Recent Labs Lab 03/11/15 1715 03/12/15 0609  CALCIUM 9.0 9.0   Sepsis Markers No results for input(s): LATICACIDVEN, PROCALCITON, O2SATVEN in the last 168 hours. ABG  Recent Labs Lab 03/11/15 1652  PHART 7.46*  PCO2ART 34  PO2ART 47*   Liver Enzymes No results for input(s): AST, ALT, ALKPHOS, BILITOT, ALBUMIN in the last 168 hours. Cardiac Enzymes No results for input(s): TROPONINI, PROBNP in the last 168 hours. Glucose  Recent Labs Lab 03/12/15 1633 03/12/15 2149 03/13/15 0018 03/13/15 0550 03/13/15 0735 03/13/15 0737  GLUCAP 393* 364* 352* 374* 408* 394*     No results found for this or any previous  visit (from the past 240 hour(s)).   Current facility-administered medications:  .  acetaminophen (TYLENOL) tablet 650 mg, 650 mg, Oral, Q6H PRN **OR** acetaminophen (TYLENOL) suppository 650 mg, 650 mg, Rectal, Q6H PRN, Gladstone Lighter, MD .  amLODipine (NORVASC) tablet 5 mg, 5 mg, Oral, Daily, Gladstone Lighter, MD, 5 mg at 03/13/15 0813 .  budesonide-formoterol (SYMBICORT) 160-4.5 MCG/ACT inhaler 2 puff, 2 puff, Inhalation, BID, Gladstone Lighter, MD, 2 puff at 03/13/15 0816 .  enoxaparin (LOVENOX) injection 40 mg, 40 mg, Subcutaneous, Q24H, Gladstone Lighter, MD, 40 mg at 03/12/15 1712 .  HYDROcodone-acetaminophen (NORCO/VICODIN) 5-325 MG per tablet 1 tablet, 1 tablet, Oral, Q4H PRN, Gladstone Lighter, MD .  insulin aspart (novoLOG) injection 0-5 Units, 0-5 Units, Subcutaneous, QHS, Gladstone Lighter, MD, 5 Units at 03/12/15 2258 .  insulin aspart  (novoLOG) injection 0-9 Units, 0-9 Units, Subcutaneous, TID WC, Gladstone Lighter, MD, 9 Units at 03/13/15 0813 .  insulin glargine (LANTUS) injection 18 Units, 18 Units, Subcutaneous, QHS, Gladstone Lighter, MD .  ipratropium-albuterol (DUONEB) 0.5-2.5 (3) MG/3ML nebulizer solution 3 mL, 3 mL, Nebulization, Q4H, Gladstone Lighter, MD, 3 mL at 03/13/15 0751 .  levofloxacin (LEVAQUIN) tablet 250 mg, 250 mg, Oral, Daily, Crystal G Scarpena, RPH, 250 mg at 03/13/15 0813 .  linagliptin (TRADJENTA) tablet 5 mg, 5 mg, Oral, Daily, Gladstone Lighter, MD, 5 mg at 03/13/15 0813 .  metFORMIN (GLUCOPHAGE) tablet 1,000 mg, 1,000 mg, Oral, BID WC, Gladstone Lighter, MD .  ondansetron (ZOFRAN) tablet 4 mg, 4 mg, Oral, Q6H PRN **OR** ondansetron (ZOFRAN) injection 4 mg, 4 mg, Intravenous, Q6H PRN, Gladstone Lighter, MD .  predniSONE (DELTASONE) tablet 50 mg, 50 mg, Oral, Q breakfast, Gladstone Lighter, MD, 50 mg at 03/13/15 1006 .  zolpidem (AMBIEN) tablet 5 mg, 5 mg, Oral, QHS PRN, Gladstone Lighter, MD  IMAGING  CTA Chest 2/3 Result     CLINICAL DATA: 80 y.o. male with a known history of CKD stage III, diabetes, hypertension and hyperlipidemia and recent diagnosis of arthritis and started on methotrexate about 2 months ago was sent in from PCPs office due to worsening hypoxia and dyspnea for 2 weeks now.  EXAM: CT ANGIOGRAPHY CHEST WITH CONTRAST  TECHNIQUE: Multidetector CT imaging of the chest was performed using the standard protocol during bolus administration of intravenous contrast. Multiplanar CT image reconstructions and MIPs were obtained to evaluate the vascular anatomy.  CONTRAST: 21mL OMNIPAQUE IOHEXOL 350 MG/ML SOLN  COMPARISON: Current chest radiographs  FINDINGS: Angiographic study: No evidence of a pulmonary embolus. Aorta demonstrates mild atherosclerotic calcifications. Aorta is mildly dilated with the ascending portion measuring 4 mm in diameter. Common origin of  the innominate artery and left common carotid artery from the aortic arch. No aortic dissection.  Neck base and axilla: No mass or adenopathy. Thyroid is unremarkable.  Mediastinum and hila: Heart normal in size and configuration. Moderate coronary artery calcifications. There are several prominent lymph nodes, largest a precarinal node measuring 1 cm in short axis. There is enlarged right infrahilar node measuring 13 mm in short axis.  Lungs and pleura: Patchy areas of ground-glass opacity are noted bilaterally, more prominent on the right than the left. There is mild interstitial thickening. Minor centrilobular and paraseptal emphysema is noted in the upper lobes, greater on the right. No pleural effusion. No pneumothorax.  Limited upper abdomen: Liver shows central volume loss with some surface nodularity consistent with cirrhosis. No liver mass or focal lesion. Visualized spleen is mildly enlarged measuring 13 cm in short axis.  Musculoskeletal: No osteoblastic or osteolytic lesions.  Review of the MIP images confirms the above findings.  IMPRESSION: 1. No evidence of a pulmonary embolism. 2. Bilateral patchy areas of ground-glass lung opacity are noted, right greater than left. This could reflect pneumonia, particularly atypical pneumonia. Given the history, drug toxicity should be considered.      ECHO 2/3 Study Conclusions  - Left ventricle: The cavity size was normal. Systolic function was normal. The estimated ejection fraction was in the range of 60% to 65%. Wall motion was normal; there were no regional wall motion abnormalities. Doppler parameters are consistent with abnormal left ventricular relaxation (grade 1 diastolic dysfunction). - Left atrium: The atrium was normal in size. - Right ventricle: Systolic function was normal. - Pulmonary arteries: Systolic pressure was within the normal range.  Impressions:  - Grossly a normal  study. Challenging image quality.   Indwelling Urinary Catheter continued, requirement due to   Reason to continue Indwelling Urinary Catheter for strict Intake/Output monitoring for hemodynamic instability   Central Line continued, requirement due to   Reason to continue [employer] Monitoring of central venous pressure or other hemodynamic parameters   Ventilator continued, requirement due to, resp failure    Ventilator Sedation RASS 0 to -2   Cultures: BCx2  UC  Sputum 2/3>>  Antibiotics: Levaquin 2/3>> Lines:   ASSESSMENT/PLAN  80 year old male with known history of diabetes, hypertension, rheumatoid arthritis, hyperlipidemia, seen in consultation for pneumonitis and worsening dyspnea   #1 pneumonitis-differential diagnosis includes: Drug-induced, atypical infection, viral infection -Difficult to certain of this was due to a viral infection versus drug-induced pneumonitis. -I suspect it is more of a drug-induced pneumonitis however he did have fevers and chills at home and he was in presence of sick contacts (his wife). -Recommend holding methotrexate until further evaluation as an outpatient by rheumatology -Continue prednisone 50 mg, taper by 10 mg weekly -Patient will require supplemental oxygen to maintain O2 sats greater than 88% at rest and with exertion, recommend 6 minute walk test prior to discharge. Oxygen weaning can occur as an outpatient. -Patient now with significant improvement with current antibiotics, steroid, Proventil 02 -Continue with incentive spirometry -Patient with overall improvement  Community acquired pneumonia -Continue with Levaquin -CT chest reviewed, bilateral groundglass opacities with mild basilar atelectasis, also findings of COPD present.  Thrush -Continue with Magic mouthwash  Dyspnea -Secondary to pneumonitis, COPD, questionable pneumonia  COPD -Clinical diagnosis -Continue with Symbicort and Combivent  Tobacco  abuse -She quit smoking about 20 years ago, however he has substituted this with tobacco chewing -Patient counseled on tobacco chewing cessation.   Social -Patient will follow-up with [facility] in 1-2 weeks after discharge. Pulmonary clinic will call patient with appointment   Thank you for consulting [facility], we will signoff at this time.  Please feel free to contact us with any questions at [phone] (please enter 7-digits).   I have personally obtained a history, examined the patient, evaluated laboratory and imaging results, formulated the assessment and plan and placed orders.  The Patient requires high complexity decision making for assessment and support, frequent evaluation and titration of therapies, application of advanced monitoring technologies and extensive interpretation of multiple databases.  Pulmonary consult Time devoted to patient care services described in this note is 45 minutes.     Vilinda Boehringer, MD [facility] Pager [phone] (please enter 7-digits) On Call Pager - [phone] (please enter  7-digits)     03/13/2015, 10:37 AM  Note: This note was prepared with Dragon dictation along with smaller phrase technology. Any transcriptional errors that result from this process are unintentional.

## 2015-03-13 NOTE — Progress Notes (Addendum)
Sitting at rest without oxygen 94%. With exertion without oxygen 92% Notified Dr Romero Belling Ordered oxygen at night as needed

## 2015-03-13 NOTE — Progress Notes (Signed)
[place] [facility] [place] NAME: Isaac Stevens    MR#:  [id number]  DATE OF BIRTH:  March 27, 1935  SUBJECTIVE:  CHIEF COMPLAINT:  No chief complaint on file.  - doing well, didn't sleep well last night - remains on 4L o2.  REVIEW OF SYSTEMS:  Review of Systems  Constitutional: Negative for fever and chills.  Respiratory: Positive for cough and shortness of breath. Negative for wheezing.   Cardiovascular: Negative for chest pain and palpitations.  Gastrointestinal: Negative for nausea, vomiting, abdominal pain, diarrhea and constipation.  Genitourinary: Negative for dysuria.  Neurological: Positive for weakness. Negative for dizziness, sensory change, speech change, focal weakness, seizures and headaches.  Psychiatric/Behavioral: Negative for depression. The patient has insomnia.     DRUG ALLERGIES:   Allergies  Allergen Reactions  . Methotrexate Derivatives Shortness Of Breath  . Lisinopril Swelling  . Pseudoephedrine Hcl Other (See Comments)    confusion    VITALS:  Blood pressure 126/60, pulse 82, temperature 97.5 F (36.4 C), temperature source Oral, resp. rate 18, height 5\' 10"  (1.778 m), weight 98.657 kg (217 lb 8 oz), SpO2 93 %.  PHYSICAL EXAMINATION:  Physical Exam  GENERAL: 80 y.o.-year-old patient lying in the bed with no acute distress.  EYES: Pupils equal, round, reactive to light and accommodation. No scleral icterus. Extraocular muscles intact.  HEENT: Head atraumatic, normocephalic. Oropharynx and nasopharynx clear.  NECK: Supple, no jugular venous distention. No thyroid enlargement, no tenderness.  LUNGS: Normal breath sounds bilaterally, no wheezing, rales,rhonchi or crepitation. No use of accessory muscles of respiration. Decreased bibasilar breath sounds CARDIOVASCULAR: S1, S2 normal. No murmurs, rubs, or gallops.  ABDOMEN: Soft, nontender, nondistended. Bowel sounds present. No organomegaly or mass.   EXTREMITIES: No pedal edema, cyanosis, or clubbing.  NEUROLOGIC: Cranial nerves II through XII are intact. Muscle strength 5/5 in all extremities. Sensation intact. Gait not checked.  PSYCHIATRIC: The patient is alert and oriented x 3. SKIN: No obvious rash, lesion, or ulcer.    LABORATORY PANEL:   CBC  Recent Labs Lab 03/12/15 0609  WBC 7.4  HGB 11.3*  HCT 33.8*  PLT 283   ------------------------------------------------------------------------------------------------------------------  Chemistries   Recent Labs Lab 03/12/15 0609  NA 136  K 4.4  CL 102  CO2 22  GLUCOSE 291*  BUN 22*  CREATININE 1.61*  CALCIUM 9.0   ------------------------------------------------------------------------------------------------------------------  Cardiac Enzymes No results for input(s): TROPONINI in the last 168 hours. ------------------------------------------------------------------------------------------------------------------  RADIOLOGY:  Dg Chest 2 View  03/11/2015  CLINICAL DATA:  Worsening shortness of Breath EXAM: CHEST  2 VIEW COMPARISON:  None FINDINGS: The heart size and mediastinal contours are within normal limits. Both lungs are clear. The visualized skeletal structures are unremarkable. IMPRESSION: No active cardiopulmonary disease. Electronically Signed   By: Kerby Moors M.D.   On: 03/11/2015 17:35   Ct Angio Chest Pe W/cm &/or Wo Cm  03/11/2015  CLINICAL DATA:  80 y.o. male with a known history of CKD stage III, diabetes, hypertension and hyperlipidemia and recent diagnosis of arthritis and started on methotrexate about 2 months ago was sent in from PCPs office due to worsening hypoxia and dyspnea for 2 weeks now. EXAM: CT ANGIOGRAPHY CHEST WITH CONTRAST TECHNIQUE: Multidetector CT imaging of the chest was performed using the standard protocol during bolus administration of intravenous contrast. Multiplanar CT image reconstructions and MIPs were obtained to  evaluate the vascular anatomy. CONTRAST:  26mL OMNIPAQUE IOHEXOL 350 MG/ML SOLN COMPARISON:  Current chest  radiographs FINDINGS: Angiographic study: No evidence of a pulmonary embolus. Aorta demonstrates mild atherosclerotic calcifications. Aorta is mildly dilated with the ascending portion measuring 4 mm in diameter. Common origin of the innominate artery and left common carotid artery from the aortic arch. No aortic dissection. Neck base and axilla: No mass or adenopathy. Thyroid is unremarkable. Mediastinum and hila: Heart normal in size and configuration. Moderate coronary artery calcifications. There are several prominent lymph nodes, largest a precarinal node measuring 1 cm in short axis. There is enlarged right infrahilar node measuring 13 mm in short axis. Lungs and pleura: Patchy areas of ground-glass opacity are noted bilaterally, more prominent on the right than the left. There is mild interstitial thickening. Minor centrilobular and paraseptal emphysema is noted in the upper lobes, greater on the right. No pleural effusion. No pneumothorax. Limited upper abdomen: Liver shows central volume loss with some surface nodularity consistent with cirrhosis. No liver mass or focal lesion. Visualized spleen is mildly enlarged measuring 13 cm in short axis. Musculoskeletal:  No osteoblastic or osteolytic lesions. Review of the MIP images confirms the above findings. IMPRESSION: 1. No evidence of a pulmonary embolism. 2. Bilateral patchy areas of ground-glass lung opacity are noted, right greater than left. This could reflect pneumonia, particularly atypical pneumonia. Given the history, drug toxicity should be considered. Electronically Signed   By: Lajean Manes M.D.   On: 03/11/2015 20:28    EKG:   Orders placed or performed in visit on 12/07/10  . EKG 12-Lead  . EKG 12-Lead    ASSESSMENT AND PLAN:   Isaac Stevens is a 80 y.o. male with a known history of CKD stage III, diabetes, hypertension and  hyperlipidemia and recent diagnosis of arthritis and started on methotrexate about 2 months ago was sent in from PCPs office due to worsening hypoxia and dyspnea for 2 weeks now.  #1 acute hypoxic respiratory failure-  currently on 4 L oxygen. Patient did not have any home oxygen prior to this before. -CT of the chest-with no pulmonary embolism. However bilateral patchy areas of groundglass opacities noted.. Could be pneumonitis versus  side effect from his methotrexate since all his symptoms started after using methotrexate. -Discontinued methotrexate at this time -2-D echocardiogram with normal EF and no cardiac abnormalities -pulmonary consult today - wean o2 as tolerated- might need home o2 - change solumedrol to oral prednisone, cont inh, also levaquin added  #2 hypertension-continue amlodipine  #3 diabetes mellitus- A1c 7.5. On sliding scale insulin. -Continue glimepiride and Januvia. Metformin held due to CT with contrast - will be restarted. -Sugars are elevated due to being on steroids. Added Lantus at bedtime  #4 DVT prophylaxis-on Lovenox   Appreciate physical therapy consult. No PT needs identified. Possible discharge tomorrow   All the records are reviewed and case discussed with Care Management/Social Workerr. Management plans discussed with the patient, family and they are in agreement.  CODE STATUS: Full Code  TOTAL TIME TAKING CARE OF THIS PATIENT: 37 minutes.   POSSIBLE D/C TOMORROW, DEPENDING ON CLINICAL CONDITION.   Gladstone Lighter M.D on 03/13/2015 at 9:25 AM  Between 7am to 6pm - Pager - [phone]  After 6pm go to [email or web address] - password [email or web address] [facility]  [place] [facility]  Office  [phone]  CC: Primary care physician; Adin Hector, MD

## 2015-03-14 LAB — CREATININE, SERUM
Creatinine, Ser: 1.6 mg/dL — ABNORMAL HIGH (ref 0.61–1.24)
GFR, EST AFRICAN AMERICAN: 46 mL/min — AB (ref >60)
GFR, EST NON AFRICAN AMERICAN: 40 mL/min — AB (ref >60)

## 2015-03-14 LAB — GLUCOSE, CAPILLARY
Glucose-Capillary: 175 mg/dL — ABNORMAL HIGH (ref 65–99)
Glucose-Capillary: 147 mg/dL — ABNORMAL HIGH (ref 65–99)

## 2015-03-14 MED ORDER — IPRATROPIUM-ALBUTEROL 0.5-2.5 (3) MG/3ML IN SOLN: 3.0000 mL | Freq: Four times a day (QID) | RESPIRATORY_TRACT | Status: DC | PRN

## 2015-03-14 MED ORDER — LEVOFLOXACIN 250 MG PO TABS: 250.0000 mg | ORAL_TABLET | Freq: Every day | ORAL | Status: DC

## 2015-03-14 MED ORDER — PREDNISONE 50 MG PO TABS: ORAL_TABLET | ORAL | Status: DC

## 2015-03-14 NOTE — Progress Notes (Signed)
Reviewed Dc instructions with patient and family.  Pt discharged.

## 2015-03-14 NOTE — Progress Notes (Signed)
SATURATION QUALIFICATIONS: (This note is used to comply with regulatory documentation for home oxygen)  Patient Saturations on Room Air at Rest = 96%  Patient Saturations on Room Air while Ambulating = 90%  Patient Saturations on 0Liters of oxygen while Ambulating = 91%  Please briefly explain why patient needs home oxygen:

## 2015-03-14 NOTE — Discharge Summary (Signed)
[place] at [place] NAME: Isaac Stevens    MR#:  [id number]  DATE OF BIRTH:  Jan 13, 1936  DATE OF ADMISSION:  03/11/2015 ADMITTING PHYSICIAN: Dr. Gladstone Lighter  DATE OF DISCHARGE: 03/14/2015 11:06 AM  PRIMARY CARE PHYSICIAN: Tama High III, MD    ADMISSION DIAGNOSIS:  Acute COPD Exacerbation   DISCHARGE DIAGNOSIS:  Active Problems:   COPD exacerbation ([place])   SECONDARY DIAGNOSIS:   Past Medical History  Diagnosis Date  . Hyperlipidemia   . Diabetes mellitus without complication ([place])   . Hypertension   . CAFL (chronic airflow limitation) ([facility])   . CKD (chronic kidney disease)   . Rheumatoid arthritis [facility])     HOSPITAL COURSE:   Isaac Stevens is a 80 y.o. male with a known history of CKD stage III, diabetes, hypertension and hyperlipidemia and recent diagnosis of arthritis and started on methotrexate about 2 months ago was sent in from PCPs office due to worsening hypoxia and dyspnea for 2 weeks now.  #1 acute hypoxic respiratory failure-likely alveolitis from drug toxicity or infection - Improved with levaquin and steroids - discharged on steroids until seen by pulmonary as outpatient - was on 4L o2, now off o2 and not requiring on ambulation as well -CT of the chest-with no pulmonary embolism. However bilateral patchy areas of groundglass opacities noted.. Could be pneumonitis versus side effect from his methotrexate since all his symptoms started after using methotrexate. -Discontinued methotrexate at this time -2-D echocardiogram with normal EF and no cardiac abnormalities -Appreciate pulmonary consult - will f/u as outpatient, might need f/u CT in 4-6 weeks to ensure clearance  #2 hypertension-continue amlodipine  #3 diabetes mellitus- A1c 7.5.  -Continue glimepiride and Januvia. Metformin restarted at discharge Monitor sugars as outpatient while on steroids.   Ambulated well today. Being discharged  today.   DISCHARGE CONDITIONS:   Stable  CONSULTS OBTAINED:   Pulmonary consult by Dr. Vilinda Boehringer  DRUG ALLERGIES:   Allergies  Allergen Reactions  . Methotrexate Derivatives Shortness Of Breath  . Lisinopril Swelling  . Pseudoephedrine Hcl Other (See Comments)    confusion    DISCHARGE MEDICATIONS:   Discharge Medication List as of 03/14/2015 10:27 AM    START taking these medications   Details  ipratropium-albuterol (DUONEB) 0.5-2.5 (3) MG/3ML SOLN Take 3 mLs by nebulization every 6 (six) hours as needed., Starting 03/14/2015, Until Discontinued, Print    levofloxacin (LEVAQUIN) 250 MG tablet Take 1 tablet (250 mg total) by mouth daily. X 5 more days, Starting 03/14/2015, Until Discontinued, Print    predniSONE (DELTASONE) 50 MG tablet Take 50mg  PO qdaily for 1 week and then 40mg  PO qdaily for 10 days or until seen by pulmonologist, Print      CONTINUE these medications which have NOT CHANGED   Details  albuterol-ipratropium (COMBIVENT) 18-103 MCG/ACT inhaler Inhale into the lungs every 4 (four) hours., Until Discontinued, Historical Med    amLODipine (NORVASC) 5 MG tablet TAKE 1 TABLET BY MOUTH EVERY DAY, Historical Med    budesonide-formoterol (SYMBICORT) 160-4.5 MCG/ACT inhaler Inhale 2 puffs into the lungs 2 (two) times daily., Until Discontinued, Historical Med    EPIPEN 2-PAK 0.3 MG/0.3ML SOAJ injection See admin instructions., Starting 07/31/2014, Until Discontinued, Historical Med    glimepiride (AMARYL) 4 MG tablet TAKE 1 TABLET BY MOUTH TWICE A DAY, Historical Med    JANUVIA 100 MG tablet TAKE 1 TABLET (100 MG TOTAL) BY MOUTH ONCE DAILY., Historical Med  metFORMIN (GLUCOPHAGE) 1000 MG tablet Reported on 03/11/2015, Historical Med      STOP taking these medications     indomethacin (INDOCIN) 50 MG capsule          DISCHARGE INSTRUCTIONS:   1. PCP f/u in 1-2 weeks 2. Pulmonary follow up in 2 weeks  If you experience worsening of your admission  symptoms, develop shortness of breath, life threatening emergency, suicidal or homicidal thoughts you must seek medical attention immediately by calling 911 or calling your MD immediately  if symptoms less severe.  You Must read complete instructions/literature along with all the possible adverse reactions/side effects for all the Medicines you take and that have been prescribed to you. Take any new Medicines after you have completely understood and accept all the possible adverse reactions/side effects.   Please note  You were cared for by a hospitalist during your hospital stay. If you have any questions about your discharge medications or the care you received while you were in the hospital after you are discharged, you can call the unit and asked to speak with the hospitalist on call if the hospitalist that took care of you is not available. Once you are discharged, your primary care physician will handle any further medical issues. Please note that NO REFILLS for any discharge medications will be authorized once you are discharged, as it is imperative that you return to your primary care physician (or establish a relationship with a primary care physician if you do not have one) for your aftercare needs so that they can reassess your need for medications and monitor your lab values.    Today   CHIEF COMPLAINT:  No chief complaint on file.    VITAL SIGNS:  Blood pressure 111/55, pulse 80, temperature 97.7 F (36.5 C), temperature source Oral, resp. rate 21, height 5\' 10"  (1.778 m), weight 98.657 kg (217 lb 8 oz), SpO2 91 %.  I/O:  No intake or output data in the 24 hours ending 03/14/15 1337  PHYSICAL EXAMINATION:   Physical Exam  GENERAL: 80 y.o.-year-old patient lying in the bed with no acute distress.  EYES: Pupils equal, round, reactive to light and accommodation. No scleral icterus. Extraocular muscles intact.  HEENT: Head atraumatic, normocephalic. Oropharynx and  nasopharynx clear.  NECK: Supple, no jugular venous distention. No thyroid enlargement, no tenderness.  LUNGS: Normal breath sounds bilaterally, no wheezing, rales,rhonchi or crepitation. No use of accessory muscles of respiration. Decreased bibasilar breath sounds CARDIOVASCULAR: S1, S2 normal. No murmurs, rubs, or gallops.  ABDOMEN: Soft, nontender, nondistended. Bowel sounds present. No organomegaly or mass.  EXTREMITIES: No pedal edema, cyanosis, or clubbing.  NEUROLOGIC: Cranial nerves II through XII are intact. Muscle strength 5/5 in all extremities. Sensation intact. Gait not checked.  PSYCHIATRIC: The patient is alert and oriented x 3. SKIN: No obvious rash, lesion, or ulcer.   DATA REVIEW:   CBC  Recent Labs Lab 03/12/15 0609  WBC 7.4  HGB 11.3*  HCT 33.8*  PLT 283    Chemistries   Recent Labs Lab 03/12/15 0609 03/14/15 1007  NA 136  --   K 4.4  --   CL 102  --   CO2 22  --   GLUCOSE 291*  --   BUN 22*  --   CREATININE 1.61* 1.60*  CALCIUM 9.0  --     Cardiac Enzymes No results for input(s): TROPONINI in the last 168 hours.  Microbiology Results  No results found for this or any  previous visit.  RADIOLOGY:  No results found.  EKG:   Orders placed or performed in visit on 12/07/10  . EKG 12-Lead  . EKG 12-Lead      Management plans discussed with the patient, family and they are in agreement.  CODE STATUS:     Code Status Orders        Start     Ordered   03/11/15 1651  Full code   Continuous     03/11/15 1651    Code Status History    Date Active Date Inactive Code Status Order ID Comments User Context   This patient has a current code status but no historical code status.    Advance Directive Documentation        Most Recent Value   Type of Advance Directive  Living will   Pre-existing out of facility DNR order (yellow form or pink MOST form)     "MOST" Form in Place?        TOTAL TIME TAKING CARE OF THIS PATIENT: 37  minutes.    Makynzie Dobesh M.D on 03/14/2015 at 1:37 PM  Between 7am to 6pm - Pager - [phone]  After 6pm go to [email or web address] - password [email or web address] [facility]  [place]  Office  [phone]  CC: Primary care physician; Adin Hector, MD

## 2015-03-16 ENCOUNTER — Telehealth: Payer: Self-pay | Admitting: *Deleted

## 2015-03-16 NOTE — Telephone Encounter (Signed)
LMOAM for pt to return call to schedule hosp f/u. Will await call.

## 2015-03-16 NOTE — Telephone Encounter (Signed)
-----   Message from Vilinda Boehringer, MD sent at 03/13/2015 11:29 AM EST ----- Regarding: hfu Please schedule follow up (hosptila follow up) with next available provider in 1-2 weeks.    Thank you Dr. Stevenson Clinch

## 2015-03-19 NOTE — Telephone Encounter (Signed)
lmtcb x2 for pt. 

## 2015-03-22 NOTE — Telephone Encounter (Signed)
LM on home phone and mobile for pt to call back to schedule hosp f/u.

## 2015-03-23 ENCOUNTER — Encounter: Payer: Self-pay | Admitting: *Deleted

## 2015-03-23 NOTE — Telephone Encounter (Signed)
LM on VM for pt to call back to schedule appt. Multiple attempts made to contact pt will mail letter. Nothing further needed.

## 2015-03-29 DIAGNOSIS — R070 Pain in throat: Secondary | ICD-10-CM | POA: Diagnosis not present

## 2015-03-29 DIAGNOSIS — B379 Candidiasis, unspecified: Secondary | ICD-10-CM | POA: Diagnosis not present

## 2015-04-02 ENCOUNTER — Encounter: Payer: Self-pay | Admitting: Internal Medicine

## 2015-04-02 ENCOUNTER — Ambulatory Visit (INDEPENDENT_AMBULATORY_CARE_PROVIDER_SITE_OTHER): Payer: PPO | Admitting: Internal Medicine

## 2015-04-02 VITALS — BP 140/82 | HR 91 | Ht 70.0 in | Wt 220.8 lb

## 2015-04-02 DIAGNOSIS — N183 Chronic kidney disease, stage 3 (moderate): Secondary | ICD-10-CM | POA: Diagnosis not present

## 2015-04-02 DIAGNOSIS — E118 Type 2 diabetes mellitus with unspecified complications: Secondary | ICD-10-CM | POA: Diagnosis not present

## 2015-04-02 DIAGNOSIS — E1165 Type 2 diabetes mellitus with hyperglycemia: Secondary | ICD-10-CM | POA: Diagnosis not present

## 2015-04-02 DIAGNOSIS — I10 Essential (primary) hypertension: Secondary | ICD-10-CM | POA: Diagnosis not present

## 2015-04-02 DIAGNOSIS — J189 Pneumonia, unspecified organism: Secondary | ICD-10-CM | POA: Diagnosis not present

## 2015-04-02 DIAGNOSIS — R0600 Dyspnea, unspecified: Secondary | ICD-10-CM | POA: Diagnosis not present

## 2015-04-02 DIAGNOSIS — J449 Chronic obstructive pulmonary disease, unspecified: Secondary | ICD-10-CM | POA: Diagnosis not present

## 2015-04-02 DIAGNOSIS — T50905A Adverse effect of unspecified drugs, medicaments and biological substances, initial encounter: Principal | ICD-10-CM

## 2015-04-02 MED ORDER — PREDNISONE 20 MG PO TABS: ORAL_TABLET | ORAL | Status: DC

## 2015-04-02 NOTE — Assessment & Plan Note (Addendum)
Patient with known history of COPD, currently well-controlled on Symbicort and Combivent rescue inhaler.   Plan: -Pulmonary function testing and 6 minute walk test prior to follow-up visit in 3 months -Continue with  Symbicort and Combivent rescue inhaler -Avoid any forms of tobacco

## 2015-04-02 NOTE — Assessment & Plan Note (Signed)
Multifactorial: Deconditioning, COPD, drug induced pneumonitis, Currently, near baseline breathing  Plan: -Prednisone taper -CT scan with contrast of chest -Pulmonary function testing and 6 minute walk test -Continue with Symbicort and Combivent rescue inhaler

## 2015-04-02 NOTE — Assessment & Plan Note (Signed)
Pneumonitis, most likely drug-induced, secondary to methotrexate. Currently on prednisone taper. Temporal relationship with CT findings and clinical symptoms highly suggestive of methotrexate pneumonitis.  Methotrexate has been stopped since early February 2017, patient is on was back to baseline breathing at this time. And he is currently completing a prednisone taper.  Plan: -Prednisone 30 mg for one week, then 20 mg for one week, then 10 mg for one week, then stop. -Repeat CT chest with contrast prior to follow-up in 3 months -Follow-up with rheumatology for other treatments of rheumatoid arthritis. -Pulmonary function testing and 6 minute walk test prior to follow-up visit

## 2015-04-02 NOTE — Progress Notes (Signed)
[facility] Consultation    Date: 04/02/2015  MRN# [id number] Isaac Stevens Feb 19, 1935  Referring Physician: Hospital Follow up - Dr. Domenic Polite is a 80 y.o. old male seen in consultation for hospital follow for suspected drug induced (MTX) pneumonitis   CC:  Chief Complaint  Patient presents with  . Hospitalization Follow-up    pt. states breathing has improved since hosp. SOB has improved. denies cough, wheezing or chest pain/tightness.     HPI:  Patient is a pleasant 80 year old male past medical history of rheumatoid arthritis, hyperlipidemia, retention, diabetes, seen in consultation for hospital follow-up. Briefly, patient was started on methotrexate by his primary rheumatologist 2-3 months ago, started having increased shortness of breath especially with exertion, then subjective fever or chills or weakness in mid January, he had presented to the hospital in early February, CT scan showed bilateral groundglass opacities, suggestive of interstitial process, that time was determined he is more likely having a drug-induced pneumonitis, secondary to methotrexate. His methotrexate was stopped, he was started on prednisone 50 mg for one week, and discharged on 40 mg daily until follow-up with pulmonary. He last took his dose of prednisone on Wednesday. At today's visit he states some mild shortness of breath with exertion, he has follow-up with ENT, his thrush has resolved from early February. 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 He has not use Combivent since he has been on steroids for the past 2-1/2 weeks. Today he is accompanied by his wife.    Chart Review 03/13/15 Pulmonary Consult - Dr. Stevenson Clinch HPI: 80 year old male with past medical history of stage III kidney disease, diabetes, hypertension, rheumatoid arthritis, hyperlipidemia, admitted for significant dyspnea and  pneumonitis. Reason for consult dyspnea and pneumonitis. History per patient review of chart. Patient states that about 2 months ago he was started on methotrexate by his rheumatologist for history of rheumatoid arthritis, further history reveals that he was on methotrexate and prednisone weaning doses. About 2-3 weeks ago he started to feel mild shortness of breath especially with exertion, in the last week he started having subjective fevers, chills, weakness, weight loss, worsening shortness of breath with lying down. He presented to the hospital with the symptoms that was again worse in the last week, CT scan of chest showed bilateral groundglass opacities, right greater than left, consistent with pneumonitis. Patient states pets at home include outside cats, he is a former smoker (20+ years, about 2 packs per day, quit 20 years ago). Also stated over the past week he has had significant sore throat, review of chart shows that he has been seen by his primary care physician and by ENT, diagnosed with thrush and started on a mouthwash for it. Sick contacts include his wife who had a possible upper respiratory viral illness. Today he states since starting steroids yesterday and stopping methotrexate he is significantly improved, he is requiring about 3-4 L to maintain oxygen saturations greater than 90%. Patient also stated that he chews tobacco daily  PMHX:   Past Medical History  Diagnosis Date  . Hyperlipidemia   . Diabetes mellitus without complication ([place])   . Hypertension   . CAFL (chronic airflow limitation) ([facility])   . CKD (chronic kidney disease)   . Rheumatoid arthritis ([place])    Surgical Hx:  Past Surgical History  Procedure Laterality Date  . Toe surgery      right  foot   Family Hx:  Family History  Problem Relation Age of Onset  . Prostate cancer Father    Social Hx:   Social History  Substance Use Topics  . Smoking status: Former Research scientist (life sciences)  . Smokeless tobacco: Current  User    Types: Chew     Comment: quit 20 years ago , chews tobacco  . Alcohol Use: No   Medication:   Current Outpatient Rx  Name  Route  Sig  Dispense  Refill  . albuterol-ipratropium (COMBIVENT) 18-103 MCG/ACT inhaler   Inhalation   Inhale into the lungs every 4 (four) hours.         Marland Kitchen amLODipine (NORVASC) 5 MG tablet      TAKE 1 TABLET BY MOUTH EVERY DAY         . budesonide-formoterol (SYMBICORT) 160-4.5 MCG/ACT inhaler   Inhalation   Inhale 2 puffs into the lungs 2 (two) times daily.         Marland Kitchen EPIPEN 2-PAK 0.3 MG/0.3ML SOAJ injection      See admin instructions.      5     Dispense as written.   . folic acid (FOLVITE) 1 MG tablet      TAKE 1 TABLET (1 MG TOTAL) BY MOUTH ONCE DAILY.      11   . glimepiride (AMARYL) 4 MG tablet      TAKE 1 TABLET BY MOUTH TWICE A DAY         . ipratropium-albuterol (DUONEB) 0.5-2.5 (3) MG/3ML SOLN   Nebulization   Take 3 mLs by nebulization every 6 (six) hours as needed.   300 mL   0   . JANUVIA 100 MG tablet      TAKE 1 TABLET (100 MG TOTAL) BY MOUTH ONCE DAILY.      11     Dispense as written.   . metFORMIN (GLUCOPHAGE) 1000 MG tablet      Reported on 03/11/2015         . predniSONE (DELTASONE) 20 MG tablet      Take 30mg (1.5 tab) feb 24-march2, then 20mg  (1 tab) March 3-march 9, then 10mg  (0.5 tab) march 10 to march 17   21 tablet   0       Allergies:  Methotrexate derivatives; Lisinopril; and Pseudoephedrine hcl  Review of Systems  Constitutional: Negative for fever, chills, weight loss, malaise/fatigue and diaphoresis.  HENT: Positive for hearing loss. Negative for congestion, ear discharge and tinnitus.        Chronic hearing loss, wears bilateral hearing aids  Eyes: Negative for blurred vision, double vision and photophobia.  Respiratory: Negative for cough, hemoptysis, sputum production, shortness of breath and wheezing.   Cardiovascular: Negative for chest pain and palpitations.    Gastrointestinal: Negative for heartburn, nausea, vomiting and abdominal pain.  Genitourinary: Negative for dysuria.  Musculoskeletal: Negative for myalgias.  Skin: Negative for itching and rash.  Neurological: Negative for dizziness and headaches.  Endo/Heme/Allergies: Does not bruise/bleed easily.  Psychiatric/Behavioral: Negative for depression.     Physical Examination:   VS: BP 140/82 mmHg  Pulse 91  Ht 5\' 10"  (1.778 m)  Wt 220 lb 12.8 oz (100.154 kg)  BMI 31.68 kg/m2  SpO2 96%  General Appearance: No distress  Neuro:without focal findings, mental status, speech normal, alert and oriented, cranial nerves 2-12 intact, reflexes normal and symmetric, sensation grossly normal  HEENT: PERRLA, EOM intact, no ptosis, no other lesions noticed; Mallampati 1 Pulmonary: normal breath sounds., diaphragmatic excursion  normal.No wheezing, No rales;   Sputum Production:  None CardiovascularNormal S1,S2.  No m/r/g.  Abdominal aorta pulsation normal.    Abdomen: Benign, Soft, non-tender, No masses, hepatosplenomegaly, No lymphadenopathy Renal:  No costovertebral tenderness  GU:  No performed at this time. Endoc: No evident thyromegaly, no signs of acromegaly or Cushing features Skin:   warm, no rashes, no ecchymosis  Extremities: normal, no cyanosis, clubbing, no edema, warm with normal capillary refill. Other findings: None   Rad results: (The following images and results were reviewed by Dr. Stevenson Clinch on 04/02/2015). CT Chest 03/2015 CLINICAL DATA: 80 y.o. male with a known history of CKD stage III, diabetes, hypertension and hyperlipidemia and recent diagnosis of arthritis and started on methotrexate about 2 months ago was sent in from PCPs office due to worsening hypoxia and dyspnea for 2 weeks now.  EXAM: CT ANGIOGRAPHY CHEST WITH CONTRAST  TECHNIQUE: Multidetector CT imaging of the chest was performed using the standard protocol during bolus administration of  intravenous contrast. Multiplanar CT image reconstructions and MIPs were obtained to evaluate the vascular anatomy.  CONTRAST: 31mL OMNIPAQUE IOHEXOL 350 MG/ML SOLN  COMPARISON: Current chest radiographs  FINDINGS: Angiographic study: No evidence of a pulmonary embolus. Aorta demonstrates mild atherosclerotic calcifications. Aorta is mildly dilated with the ascending portion measuring 4 mm in diameter. Common origin of the innominate artery and left common carotid artery from the aortic arch. No aortic dissection.  Neck base and axilla: No mass or adenopathy. Thyroid is unremarkable.  Mediastinum and hila: Heart normal in size and configuration. Moderate coronary artery calcifications. There are several prominent lymph nodes, largest a precarinal node measuring 1 cm in short axis. There is enlarged right infrahilar node measuring 13 mm in short axis.  Lungs and pleura: Patchy areas of ground-glass opacity are noted bilaterally, more prominent on the right than the left. There is mild interstitial thickening. Minor centrilobular and paraseptal emphysema is noted in the upper lobes, greater on the right. No pleural effusion. No pneumothorax.  Limited upper abdomen: Liver shows central volume loss with some surface nodularity consistent with cirrhosis. No liver mass or focal lesion. Visualized spleen is mildly enlarged measuring 13 cm in short axis.  Musculoskeletal: No osteoblastic or osteolytic lesions.  Review of the MIP images confirms the above findings.  IMPRESSION: 1. No evidence of a pulmonary embolism. 2. Bilateral patchy areas of ground-glass lung opacity are noted, right greater than left. This could reflect pneumonia, particularly atypical pneumonia. Given the history, drug toxicity should be considered.    Assessment and Plan: 80 year old male past medical history of COPD, rheumatoid arthritis, hyperlipidemia, diabetes, seen in consultation for  hospital follow-up of drug induced pneumonitis and secondary to being on methotrexate. Drug-induced pneumonitis Pneumonitis, most likely drug-induced, secondary to methotrexate. Currently on prednisone taper. Temporal relationship with CT findings and clinical symptoms highly suggestive of methotrexate pneumonitis.  Methotrexate has been stopped since early February 2017, patient is on was back to baseline breathing at this time. And he is currently completing a prednisone taper.  Plan: -Prednisone 30 mg for one week, then 20 mg for one week, then 10 mg for one week, then stop. -Repeat CT chest with contrast prior to follow-up in 3 months -Follow-up with rheumatology for other treatments of rheumatoid arthritis. -Pulmonary function testing and 6 minute walk test prior to follow-up visit  COPD (chronic obstructive pulmonary disease) ([place]) Patient with known history of COPD, currently well-controlled on Symbicort and Combivent rescue inhaler.   Plan: -Pulmonary function  testing and 6 minute walk test prior to follow-up visit in 3 months -Continue with  Symbicort and Combivent rescue inhaler -Avoid any forms of tobacco  Dyspnea Multifactorial: Deconditioning, COPD, drug induced pneumonitis, Currently, near baseline breathing  Plan: -Prednisone taper -CT scan with contrast of chest -Pulmonary function testing and 6 minute walk test -Continue with Symbicort and Combivent rescue inhaler     Updated Medication List Outpatient Encounter Prescriptions as of 04/02/2015  Medication Sig  . albuterol-ipratropium (COMBIVENT) 18-103 MCG/ACT inhaler Inhale into the lungs every 4 (four) hours.  Marland Kitchen amLODipine (NORVASC) 5 MG tablet TAKE 1 TABLET BY MOUTH EVERY DAY  . budesonide-formoterol (SYMBICORT) 160-4.5 MCG/ACT inhaler Inhale 2 puffs into the lungs 2 (two) times daily.  Marland Kitchen EPIPEN 2-PAK 0.3 MG/0.3ML SOAJ injection See admin instructions.  . folic acid (FOLVITE) 1 MG tablet TAKE 1 TABLET (1 MG  TOTAL) BY MOUTH ONCE DAILY.  Marland Kitchen glimepiride (AMARYL) 4 MG tablet TAKE 1 TABLET BY MOUTH TWICE A DAY  . ipratropium-albuterol (DUONEB) 0.5-2.5 (3) MG/3ML SOLN Take 3 mLs by nebulization every 6 (six) hours as needed.  Marland Kitchen JANUVIA 100 MG tablet TAKE 1 TABLET (100 MG TOTAL) BY MOUTH ONCE DAILY.  . metFORMIN (GLUCOPHAGE) 1000 MG tablet Reported on 03/11/2015  . predniSONE (DELTASONE) 20 MG tablet Take 30mg (1.5 tab) feb 24-march2, then 20mg  (1 tab) March 3-march 9, then 10mg  (0.5 tab) march 10 to march 17  . [DISCONTINUED] levofloxacin (LEVAQUIN) 250 MG tablet Take 1 tablet (250 mg total) by mouth daily. X 5 more days (Patient not taking: Reported on 04/02/2015)  . [DISCONTINUED] predniSONE (DELTASONE) 50 MG tablet Take 50mg  PO qdaily for 1 week and then 40mg  PO qdaily for 10 days or until seen by pulmonologist (Patient not taking: Reported on 04/02/2015)   No facility-administered encounter medications on file as of 04/02/2015.    Orders for this visit: Orders Placed This Encounter  Procedures  . CT Chest W Contrast    Standing Status: Future     Number of Occurrences:      Standing Expiration Date: 05/30/2016    Order Specific Question:  If indicated for the ordered procedure, I authorize the administration of contrast media per Radiology protocol    Answer:  Yes    Order Specific Question:  Reason for Exam (SYMPTOM  OR DIAGNOSIS REQUIRED)    Answer:  pleural effusion    Order Specific Question:  Preferred imaging location?    Answer:   [facility]  . Basic metabolic panel    Standing Status: Future     Number of Occurrences:      Standing Expiration Date: 04/01/2016  . Pulmonary function test    Standing Status: Future     Number of Occurrences:      Standing Expiration Date: 04/01/2016    Order Specific Question:  Where should this test be performed?    Answer:  [facility]    Order Specific Question:  Full PFT: includes the following: basic spirometry, spirometry pre & post  bronchodilator, diffusion capacity (DLCO), lung volumes    Answer:  Full PFT  . 6 minute walk    Standing Status: Future     Number of Occurrences:      Standing Expiration Date: 04/01/2016    Order Specific Question:  Where should this test be performed?    Answer:  Other     Thank  you for the consultation and for allowing [facility] to assist in the care of your  patient. Our recommendations are noted above.  Please contact us if we can be of further service.   Vilinda Boehringer, MD [facility] Office Number: [phone]  Note: This note was prepared with Dragon dictation along with smaller phrase technology. Any transcriptional errors that result from this process are unintentional.

## 2015-04-02 NOTE — Patient Instructions (Addendum)
Follow up with Dr. Stevenson Clinch in: 3 months - CT Chest with contrast prior to follow up  - Prednisone Taper - 30mg  (Feb 24-March 2), 20mg  (March 3-March 9), 10mg  (March 10-March 17) - continue with Symbicort Inhaler - continue with rescue inhaler (Combivent) if needed for shortness of breath/wheezing/intense coughing - PFTs and 6 MWT prior to next visit.  - keep follow up with your Rheumatologist

## 2015-05-03 ENCOUNTER — Telehealth: Payer: Self-pay | Admitting: *Deleted

## 2015-05-03 DIAGNOSIS — J90 Pleural effusion, not elsewhere classified: Secondary | ICD-10-CM

## 2015-05-03 NOTE — Telephone Encounter (Signed)
Order placed for CT Chest w/o.

## 2015-06-08 DIAGNOSIS — E118 Type 2 diabetes mellitus with unspecified complications: Secondary | ICD-10-CM | POA: Diagnosis not present

## 2015-06-08 DIAGNOSIS — E538 Deficiency of other specified B group vitamins: Secondary | ICD-10-CM | POA: Diagnosis not present

## 2015-06-08 DIAGNOSIS — M064 Inflammatory polyarthropathy: Secondary | ICD-10-CM | POA: Diagnosis not present

## 2015-06-08 DIAGNOSIS — I10 Essential (primary) hypertension: Secondary | ICD-10-CM | POA: Diagnosis not present

## 2015-06-08 DIAGNOSIS — R768 Other specified abnormal immunological findings in serum: Secondary | ICD-10-CM | POA: Diagnosis not present

## 2015-06-08 DIAGNOSIS — E1165 Type 2 diabetes mellitus with hyperglycemia: Secondary | ICD-10-CM | POA: Diagnosis not present

## 2015-06-08 DIAGNOSIS — E784 Other hyperlipidemia: Secondary | ICD-10-CM | POA: Diagnosis not present

## 2015-06-08 DIAGNOSIS — J449 Chronic obstructive pulmonary disease, unspecified: Secondary | ICD-10-CM | POA: Diagnosis not present

## 2015-06-08 DIAGNOSIS — N183 Chronic kidney disease, stage 3 (moderate): Secondary | ICD-10-CM | POA: Diagnosis not present

## 2015-06-17 DIAGNOSIS — E870 Hyperosmolality and hypernatremia: Secondary | ICD-10-CM | POA: Diagnosis not present

## 2015-06-21 DIAGNOSIS — L508 Other urticaria: Secondary | ICD-10-CM | POA: Diagnosis not present

## 2015-06-21 DIAGNOSIS — J849 Interstitial pulmonary disease, unspecified: Secondary | ICD-10-CM | POA: Diagnosis not present

## 2015-06-21 DIAGNOSIS — M1990 Unspecified osteoarthritis, unspecified site: Secondary | ICD-10-CM | POA: Diagnosis not present

## 2015-06-22 ENCOUNTER — Ambulatory Visit: Payer: PPO

## 2015-06-23 ENCOUNTER — Ambulatory Visit: Payer: PPO

## 2015-06-24 ENCOUNTER — Ambulatory Visit: Payer: PPO | Admitting: Internal Medicine

## 2015-07-14 ENCOUNTER — Ambulatory Visit: Payer: PPO

## 2015-07-14 ENCOUNTER — Ambulatory Visit
Admission: RE | Admit: 2015-07-14 | Discharge: 2015-07-14 | Disposition: A | Discharge status: Home / Self Care | Payer: PPO | Source: Ambulatory Visit | Attending: Internal Medicine | Admitting: Internal Medicine

## 2015-07-14 DIAGNOSIS — R918 Other nonspecific abnormal finding of lung field: Secondary | ICD-10-CM | POA: Diagnosis not present

## 2015-07-14 DIAGNOSIS — J90 Pleural effusion, not elsewhere classified: Secondary | ICD-10-CM | POA: Diagnosis not present

## 2015-07-14 DIAGNOSIS — I700 Atherosclerosis of aorta: Secondary | ICD-10-CM | POA: Insufficient documentation

## 2015-07-14 DIAGNOSIS — R938 Abnormal findings on diagnostic imaging of other specified body structures: Secondary | ICD-10-CM | POA: Diagnosis not present

## 2015-07-14 DIAGNOSIS — I2510 Atherosclerotic heart disease of native coronary artery without angina pectoris: Secondary | ICD-10-CM | POA: Diagnosis not present

## 2015-07-14 DIAGNOSIS — R932 Abnormal findings on diagnostic imaging of liver and biliary tract: Secondary | ICD-10-CM | POA: Insufficient documentation

## 2015-07-15 ENCOUNTER — Telehealth: Payer: Self-pay | Admitting: *Deleted

## 2015-07-15 NOTE — Telephone Encounter (Signed)
Pt informed. Nothing further needed. 

## 2015-07-15 NOTE — Telephone Encounter (Signed)
-----   Message from Vilinda Boehringer, MD sent at 07/15/2015  8:02 AM EDT ----- Regarding: CT Results Please inform patient that is CT Chest scan show significant improvement, almost back to normal.  Further details to be discussed at follow up visit.  Thank you.

## 2015-07-21 ENCOUNTER — Ambulatory Visit (INDEPENDENT_AMBULATORY_CARE_PROVIDER_SITE_OTHER): Payer: PPO | Admitting: Internal Medicine

## 2015-07-21 ENCOUNTER — Encounter: Payer: Self-pay | Admitting: Internal Medicine

## 2015-07-21 VITALS — BP 142/78 | HR 75 | Ht 70.0 in | Wt 223.0 lb

## 2015-07-21 DIAGNOSIS — J189 Pneumonia, unspecified organism: Secondary | ICD-10-CM

## 2015-07-21 DIAGNOSIS — T50905A Adverse effect of unspecified drugs, medicaments and biological substances, initial encounter: Principal | ICD-10-CM

## 2015-07-21 NOTE — Patient Instructions (Addendum)
Follow up as needed.  Cont with diet and exercise.

## 2015-07-21 NOTE — Progress Notes (Signed)
[facility] Consultation      MRN# [id number] CRASH LONTZ 12/17/1935   CC: Chief Complaint  Patient presents with  . Follow-up    CT results. pt states breathing is doing well. denies sob,cough wheezing or cp/tightness.       Brief History: Patient is a pleasant 80 year old male past medical history of rheumatoid arthritis, hyperlipidemia, retention, diabetes, seen in consultation for hospital follow-up. Briefly, patient was started on methotrexate by his primary rheumatologist 2-3 months ago, started having increased shortness of breath especially with exertion, then subjective fever or chills or weakness in mid January, he had presented to the hospital in early February, CT scan showed bilateral groundglass opacities, suggestive of interstitial process, that time was determined he is more likely having a drug-induced pneumonitis, secondary to methotrexate. His methotrexate was stopped, he was started on prednisone 50 mg for one week, and discharged on 40 mg daily until follow-up with pulmonary. He last took his dose of prednisone on Wednesday. At today's visit he states some mild shortness of breath with exertion, he has follow-up with ENT, his thrush has resolved from early February. 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 He has not use Combivent since he has been on steroids for the past 2-1/2 weeks. Today he is accompanied by his wife.   Events since last clinic visit:  She presents today for follow-up visit of drug-induced pneumonitis secondary to possibly methotrexate. Since his initiation and completion of her prednisone taper his breathing is medically improve and his repeat CT scan does show some good improvement in his groundglass opacities. He had an episode of acute carrier and has follow-up with his rheumatologist, he's currently on Plaquenil for his rheumatoid arthritis. He  denies any significant shortness of breath at rest. But did state he does have some shortness of breath with intense exertion at times.    Medication:   Current Outpatient Rx  Name  Route  Sig  Dispense  Refill  . albuterol-ipratropium (COMBIVENT) 18-103 MCG/ACT inhaler   Inhalation   Inhale into the lungs every 4 (four) hours.         Marland Kitchen amLODipine (NORVASC) 5 MG tablet      TAKE 1 TABLET BY MOUTH EVERY DAY         . budesonide-formoterol (SYMBICORT) 160-4.5 MCG/ACT inhaler   Inhalation   Inhale 2 puffs into the lungs 2 (two) times daily.         Marland Kitchen EPIPEN 2-PAK 0.3 MG/0.3ML SOAJ injection      See admin instructions.      5     Dispense as written.   . folic acid (FOLVITE) 1 MG tablet      TAKE 1 TABLET (1 MG TOTAL) BY MOUTH ONCE DAILY.      11   . glimepiride (AMARYL) 4 MG tablet      TAKE 1 TABLET BY MOUTH TWICE A DAY         . ipratropium-albuterol (DUONEB) 0.5-2.5 (3) MG/3ML SOLN   Nebulization   Take 3 mLs by nebulization every 6 (six) hours as needed.   300 mL   0   . JANUVIA 100 MG tablet      TAKE 1 TABLET (100 MG TOTAL) BY MOUTH ONCE DAILY.      11     Dispense as written.   . metFORMIN (GLUCOPHAGE) 1000 MG tablet  Reported on 03/11/2015            Review of Systems  Constitutional: Negative for fever and chills.  Eyes: Negative for blurred vision.  Respiratory: Negative for cough, hemoptysis, sputum production, shortness of breath and wheezing.   Cardiovascular: Negative for chest pain.  Gastrointestinal: Negative for heartburn.  Genitourinary: Negative for dysuria.  Neurological: Negative for headaches.  Endo/Heme/Allergies: Does not bruise/bleed easily.      Allergies:  Methotrexate derivatives; Lisinopril; and Pseudoephedrine hcl  Physical Examination:  VS: BP 142/78 mmHg  Pulse 75  Ht 5\' 10"  (1.778 m)  Wt 223 lb (101.152 kg)  BMI 32.00 kg/m2  SpO2 99%  General Appearance: No distress  HEENT: PERRLA, no ptosis,  no other lesions noticed Pulmonary:normal breath sounds., diaphragmatic excursion normal.No wheezing, No rales   Cardiovascular:  Normal S1,S2.  No m/r/g.     Abdomen:Exam: Benign, Soft, non-tender, No masses  Skin:   warm, no rashes, no ecchymosis  Extremities: normal, no cyanosis, clubbing, warm with normal capillary refill.      Rad results: (The following images and results were reviewed by Dr. Stevenson Clinch on 07/21/2015).  CT Chest 07/14/15 CT CHEST WITHOUT CONTRAST  TECHNIQUE: Multidetector CT imaging of the chest was performed following the standard protocol without IV contrast. Sagittal and coronal MPR images reconstructed from axial data set.  COMPARISON: 03/11/2015  FINDINGS: Scattered atherosclerotic calcifications aorta, proximal great vessels, and coronary arteries.  No thoracic adenopathy.  Cirrhotic appearing liver.  Remaining visualized portion of upper abdomen unremarkable.  Calcified granuloma RIGHT upper lobe anteromedially image 47.  Near complete resolution of ground-glass opacities in the lung since the previous exam with minimal residual in RIGHT upper lobe images 36-39.  Minimal peribronchial thickening.  No segmental consolidation, pleural effusion, or pneumothorax.  Osseous structures unremarkable.  IMPRESSION: Near complete resolution of ground-glass opacities identified in both lungs on the previous exam with minimal residual at RIGHT apex.  Consider additional followup CT imaging in 3-6 months to ensure complete resolution.  Cirrhotic appearing liver.  No other definite intra thoracic abnormalities identified.  Extensive atherosclerotic disease.     Assessment and Plan: 80 year old male past medical history of COPD, recent diagnosis of drug-induced pneumonitis secondary to methotrexate initiation, now resolving well. Seen in clinic today for follow-up visit Drug-induced pneumonitis Resolving well, currently on Plaquenil  for RA.  He has completed a course of prednisone with significant improvement in his breathing and his CAT scan results shows significant clearing of his groundglass opacities in the upper lobe. He has canceled his pulmonary function testing and a 6 minute walk test. He stated that he does not want to be on any form of inhalers  At this time and did not see a need for further testing. Given his clinical improvement after steroid treatment for his drug-induced pneumonitis, he will follow-up as needed from this point on.    Updated Medication List Outpatient Encounter Prescriptions as of 07/21/2015  Medication Sig  . albuterol-ipratropium (COMBIVENT) 18-103 MCG/ACT inhaler Inhale into the lungs every 4 (four) hours.  Marland Kitchen amLODipine (NORVASC) 5 MG tablet TAKE 1 TABLET BY MOUTH EVERY DAY  . budesonide-formoterol (SYMBICORT) 160-4.5 MCG/ACT inhaler Inhale 2 puffs into the lungs 2 (two) times daily.  Marland Kitchen EPIPEN 2-PAK 0.3 MG/0.3ML SOAJ injection See admin instructions.  . folic acid (FOLVITE) 1 MG tablet TAKE 1 TABLET (1 MG TOTAL) BY MOUTH ONCE DAILY.  Marland Kitchen glimepiride (AMARYL) 4 MG tablet TAKE 1 TABLET BY MOUTH TWICE A DAY  .  ipratropium-albuterol (DUONEB) 0.5-2.5 (3) MG/3ML SOLN Take 3 mLs by nebulization every 6 (six) hours as needed.  Marland Kitchen JANUVIA 100 MG tablet TAKE 1 TABLET (100 MG TOTAL) BY MOUTH ONCE DAILY.  . metFORMIN (GLUCOPHAGE) 1000 MG tablet Reported on 03/11/2015  . [DISCONTINUED] predniSONE (DELTASONE) 20 MG tablet Take 30mg (1.5 tab) feb 24-march2, then 20mg  (1 tab) March 3-march 9, then 10mg  (0.5 tab) march 10 to march 17 (Patient not taking: Reported on 07/21/2015)   No facility-administered encounter medications on file as of 07/21/2015.    Orders for this visit: No orders of the defined types were placed in this encounter.    Thank  you for the visitation and for allowing  [facility] to assist in the care of your patient. Our recommendations are noted above.  Please  contact us if we can be of further service.  Vilinda Boehringer, MD [facility] Office Number: [phone]  Note: This note was prepared with Dragon dictation along with smaller phrase technology. Any transcriptional errors that result from this process are unintentional.

## 2015-07-21 NOTE — Assessment & Plan Note (Addendum)
Resolving well, currently on Plaquenil for RA.  He has completed a course of prednisone with significant improvement in his breathing and his CAT scan results shows significant clearing of his groundglass opacities in the upper lobe. He has canceled his pulmonary function testing and a 6 minute walk test. He stated that he does not want to be on any form of inhalers  At this time and did not see a need for further testing. Given his clinical improvement after steroid treatment for his drug-induced pneumonitis, he will follow-up as needed from this point on.

## 2015-10-21 ENCOUNTER — Emergency Department: Payer: PPO

## 2015-10-21 ENCOUNTER — Observation Stay
Admission: EM | Admit: 2015-10-21 | Discharge: 2015-10-22 | Disposition: A | Discharge status: Home / Self Care | Payer: PPO | Attending: Internal Medicine | Admitting: Internal Medicine

## 2015-10-21 DIAGNOSIS — Z87891 Personal history of nicotine dependence: Secondary | ICD-10-CM | POA: Diagnosis not present

## 2015-10-21 DIAGNOSIS — R4182 Altered mental status, unspecified: Secondary | ICD-10-CM | POA: Diagnosis not present

## 2015-10-21 DIAGNOSIS — E1165 Type 2 diabetes mellitus with hyperglycemia: Secondary | ICD-10-CM | POA: Diagnosis not present

## 2015-10-21 DIAGNOSIS — Z888 Allergy status to other drugs, medicaments and biological substances status: Secondary | ICD-10-CM | POA: Diagnosis not present

## 2015-10-21 DIAGNOSIS — Z79899 Other long term (current) drug therapy: Secondary | ICD-10-CM | POA: Diagnosis not present

## 2015-10-21 DIAGNOSIS — J449 Chronic obstructive pulmonary disease, unspecified: Secondary | ICD-10-CM | POA: Diagnosis not present

## 2015-10-21 DIAGNOSIS — Z8673 Personal history of transient ischemic attack (TIA), and cerebral infarction without residual deficits: Secondary | ICD-10-CM | POA: Diagnosis not present

## 2015-10-21 DIAGNOSIS — R55 Syncope and collapse: Principal | ICD-10-CM | POA: Diagnosis present

## 2015-10-21 DIAGNOSIS — Z85820 Personal history of malignant melanoma of skin: Secondary | ICD-10-CM | POA: Insufficient documentation

## 2015-10-21 DIAGNOSIS — N179 Acute kidney failure, unspecified: Secondary | ICD-10-CM | POA: Insufficient documentation

## 2015-10-21 DIAGNOSIS — E1122 Type 2 diabetes mellitus with diabetic chronic kidney disease: Secondary | ICD-10-CM | POA: Diagnosis not present

## 2015-10-21 DIAGNOSIS — E860 Dehydration: Secondary | ICD-10-CM | POA: Diagnosis not present

## 2015-10-21 DIAGNOSIS — G459 Transient cerebral ischemic attack, unspecified: Secondary | ICD-10-CM | POA: Diagnosis not present

## 2015-10-21 DIAGNOSIS — E11649 Type 2 diabetes mellitus with hypoglycemia without coma: Secondary | ICD-10-CM | POA: Diagnosis not present

## 2015-10-21 DIAGNOSIS — M069 Rheumatoid arthritis, unspecified: Secondary | ICD-10-CM | POA: Diagnosis not present

## 2015-10-21 DIAGNOSIS — E785 Hyperlipidemia, unspecified: Secondary | ICD-10-CM | POA: Diagnosis not present

## 2015-10-21 DIAGNOSIS — N183 Chronic kidney disease, stage 3 (moderate): Secondary | ICD-10-CM | POA: Insufficient documentation

## 2015-10-21 DIAGNOSIS — Z8042 Family history of malignant neoplasm of prostate: Secondary | ICD-10-CM | POA: Insufficient documentation

## 2015-10-21 DIAGNOSIS — I469 Cardiac arrest, cause unspecified: Secondary | ICD-10-CM | POA: Diagnosis not present

## 2015-10-21 DIAGNOSIS — Z794 Long term (current) use of insulin: Secondary | ICD-10-CM | POA: Insufficient documentation

## 2015-10-21 DIAGNOSIS — I129 Hypertensive chronic kidney disease with stage 1 through stage 4 chronic kidney disease, or unspecified chronic kidney disease: Secondary | ICD-10-CM | POA: Diagnosis not present

## 2015-10-21 LAB — BASIC METABOLIC PANEL
Anion gap: 12 (ref 5–15)
BUN: 17 mg/dL (ref 6–20)
CALCIUM: 8.9 mg/dL (ref 8.9–10.3)
CO2: 19 mmol/L — AB (ref 22–32)
CREATININE: 1.92 mg/dL — AB (ref 0.61–1.24)
Chloride: 108 mmol/L (ref 101–111)
GFR calc non Af Amer: 32 mL/min — ABNORMAL LOW (ref >60)
GFR, EST AFRICAN AMERICAN: 37 mL/min — AB (ref >60)
Glucose, Bld: 208 mg/dL — ABNORMAL HIGH (ref 65–99)
Potassium: 4.3 mmol/L (ref 3.5–5.1)
Sodium: 139 mmol/L (ref 135–145)

## 2015-10-21 LAB — CBC
HCT: 37.8 % — ABNORMAL LOW (ref 40.0–52.0)
Hemoglobin: 13 g/dL (ref 13.0–18.0)
MCH: 32 pg (ref 26.0–34.0)
MCHC: 34.3 g/dL (ref 32.0–36.0)
MCV: 93.2 fL (ref 80.0–100.0)
PLATELETS: 180 10*3/uL (ref 150–440)
RBC: 4.06 MIL/uL — ABNORMAL LOW (ref 4.40–5.90)
RDW: 14 % (ref 11.5–14.5)
WBC: 7.2 10*3/uL (ref 3.8–10.6)

## 2015-10-21 LAB — TROPONIN I

## 2015-10-21 LAB — GLUCOSE, CAPILLARY: GLUCOSE-CAPILLARY: 188 mg/dL — AB (ref 65–99)
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
## 2015-10-22 DIAGNOSIS — R55 Syncope and collapse: Secondary | ICD-10-CM | POA: Diagnosis not present

## 2015-10-22 DIAGNOSIS — N179 Acute kidney failure, unspecified: Secondary | ICD-10-CM | POA: Diagnosis not present

## 2015-10-22 LAB — URINALYSIS COMPLETE WITH MICROSCOPIC (ARMC ONLY)
BILIRUBIN URINE: NEGATIVE
GLUCOSE, UA: NEGATIVE mg/dL
Hgb urine dipstick: NEGATIVE
Ketones, ur: NEGATIVE mg/dL
NITRITE: POSITIVE — AB
Protein, ur: NEGATIVE mg/dL
SPECIFIC GRAVITY, URINE: 1.015 (ref 1.005–1.030)
pH: 5 (ref 5.0–8.0)

## 2015-10-22 LAB — BASIC METABOLIC PANEL
ANION GAP: 6 (ref 5–15)
BUN: 18 mg/dL (ref 6–20)
CALCIUM: 8.8 mg/dL — AB (ref 8.9–10.3)
CO2: 24 mmol/L (ref 22–32)
CREATININE: 1.7 mg/dL — AB (ref 0.61–1.24)
Chloride: 109 mmol/L (ref 101–111)
GFR, EST AFRICAN AMERICAN: 43 mL/min — AB (ref >60)
GFR, EST NON AFRICAN AMERICAN: 37 mL/min — AB (ref >60)
Glucose, Bld: 87 mg/dL (ref 65–99)
Potassium: 4 mmol/L (ref 3.5–5.1)
SODIUM: 139 mmol/L (ref 135–145)

## 2015-10-22 LAB — CBC
HCT: 35.7 % — ABNORMAL LOW (ref 40.0–52.0)
HEMOGLOBIN: 12.7 g/dL — AB (ref 13.0–18.0)
MCH: 32.1 pg (ref 26.0–34.0)
MCHC: 35.6 g/dL (ref 32.0–36.0)
MCV: 90.2 fL (ref 80.0–100.0)
PLATELETS: 163 10*3/uL (ref 150–440)
RBC: 3.96 MIL/uL — AB (ref 4.40–5.90)
RDW: 14.7 % — ABNORMAL HIGH (ref 11.5–14.5)
WBC: 8.3 10*3/uL (ref 3.8–10.6)

## 2015-10-22 LAB — GLUCOSE, CAPILLARY
GLUCOSE-CAPILLARY: 104 mg/dL — AB (ref 65–99)
GLUCOSE-CAPILLARY: 95 mg/dL (ref 65–99)
Glucose-Capillary: 140 mg/dL — ABNORMAL HIGH (ref 65–99)
Glucose-Capillary: 63 mg/dL — ABNORMAL LOW (ref 65–99)
Glucose-Capillary: 66 mg/dL (ref 65–99)

## 2015-10-22 LAB — TROPONIN I

## 2015-10-22 LAB — TSH: TSH: 3.397 u[IU]/mL (ref 0.350–4.500)

## 2015-10-22 LAB — MAGNESIUM: Magnesium: 2.1 mg/dL (ref 1.7–2.4)

## 2015-10-22 LAB — PHOSPHORUS: PHOSPHORUS: 3 mg/dL (ref 2.5–4.6)

## 2015-10-22 MED ORDER — SODIUM CHLORIDE 0.9 % IV SOLN
INTRAVENOUS | Status: DC
  Administered 2015-10-22: 02:00:00 via INTRAVENOUS

## 2015-10-22 MED ORDER — ACETAMINOPHEN 650 MG RE SUPP: 650.0000 mg | Freq: Four times a day (QID) | RECTAL | Status: DC | PRN

## 2015-10-22 MED ORDER — MAGNESIUM CITRATE PO SOLN: 1.0000 | Freq: Once | ORAL | Status: DC | PRN

## 2015-10-22 MED ORDER — PNEUMOCOCCAL VAC POLYVALENT 25 MCG/0.5ML IJ INJ: 0.5000 mL | INJECTION | INTRAMUSCULAR | Status: DC

## 2015-10-22 MED ORDER — IPRATROPIUM-ALBUTEROL 0.5-2.5 (3) MG/3ML IN SOLN: 3.0000 mL | Freq: Four times a day (QID) | RESPIRATORY_TRACT | Status: DC | PRN

## 2015-10-22 MED ORDER — AMLODIPINE BESYLATE 5 MG PO TABS
5.0000 mg | ORAL_TABLET | Freq: Every day | ORAL | Status: DC
  Administered 2015-10-22: 5 mg via ORAL
  Filled 2015-10-22: qty 1

## 2015-10-22 MED ORDER — SODIUM CHLORIDE 0.9% FLUSH
3.0000 mL | Freq: Two times a day (BID) | INTRAVENOUS | Status: DC
  Administered 2015-10-22 (×2): 3 mL via INTRAVENOUS

## 2015-10-22 MED ORDER — ACETAMINOPHEN 325 MG PO TABS: 650.0000 mg | ORAL_TABLET | Freq: Four times a day (QID) | ORAL | Status: DC | PRN

## 2015-10-22 MED ORDER — FOLIC ACID 1 MG PO TABS
1.0000 mg | ORAL_TABLET | Freq: Every day | ORAL | Status: DC
  Administered 2015-10-22: 1 mg via ORAL
  Filled 2015-10-22: qty 1

## 2015-10-22 MED ORDER — SENNOSIDES-DOCUSATE SODIUM 8.6-50 MG PO TABS: 1.0000 | ORAL_TABLET | Freq: Every evening | ORAL | Status: DC | PRN

## 2015-10-22 MED ORDER — ENOXAPARIN SODIUM 40 MG/0.4ML ~~LOC~~ SOLN
40.0000 mg | Freq: Every day | SUBCUTANEOUS | Status: DC
  Administered 2015-10-22: 02:00:00 40 mg via SUBCUTANEOUS
  Filled 2015-10-22: qty 0.4

## 2015-10-22 MED ORDER — ZOLPIDEM TARTRATE 5 MG PO TABS: 5.0000 mg | ORAL_TABLET | Freq: Every evening | ORAL | Status: DC | PRN

## 2015-10-22 MED ORDER — ONDANSETRON HCL 4 MG/2ML IJ SOLN: 4.0000 mg | Freq: Four times a day (QID) | INTRAMUSCULAR | Status: DC | PRN

## 2015-10-22 MED ORDER — INSULIN ASPART 100 UNIT/ML ~~LOC~~ SOLN: 0.0000 [IU] | Freq: Every day | SUBCUTANEOUS | Status: DC

## 2015-10-22 MED ORDER — INSULIN ASPART 100 UNIT/ML ~~LOC~~ SOLN: 0.0000 [IU] | Freq: Three times a day (TID) | SUBCUTANEOUS | Status: DC

## 2015-10-22 MED ORDER — VITAMIN B-12 1000 MCG PO TABS
1000.0000 ug | ORAL_TABLET | Freq: Every day | ORAL | Status: DC
  Administered 2015-10-22: 1000 ug via ORAL
  Filled 2015-10-22: qty 1

## 2015-10-22 MED ORDER — HYDROCODONE-ACETAMINOPHEN 5-325 MG PO TABS
1.0000 | ORAL_TABLET | ORAL | Status: DC | PRN
Start: 2015-10-22 — End: 2015-10-22

## 2015-10-22 MED ORDER — ONDANSETRON HCL 4 MG PO TABS: 4.0000 mg | ORAL_TABLET | Freq: Four times a day (QID) | ORAL | Status: DC | PRN

## 2015-10-22 MED ORDER — HYDROXYCHLOROQUINE SULFATE 200 MG PO TABS
400.0000 mg | ORAL_TABLET | Freq: Every day | ORAL | Status: DC
  Administered 2015-10-22: 09:00:00 400 mg via ORAL
  Filled 2015-10-22: qty 2

## 2015-10-22 MED ORDER — LORATADINE 10 MG PO TABS
10.0000 mg | ORAL_TABLET | Freq: Every day | ORAL | Status: DC
  Administered 2015-10-22: 10 mg via ORAL
  Filled 2015-10-22: qty 1
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
## 2015-10-22 NOTE — Plan of Care (Signed)
Problem: Education: Goal: Knowledge of [facility] Education information/materials will improve Outcome: Progressing Pt likes to be called Isaac Stevens   PAST MEDICAL HISTORY:     Past Medical History:  Diagnosis Date  . CAFL (chronic airflow limitation) (HCC)   . CKD (chronic kidney disease)   . Diabetes mellitus without complication ([place])   . Hyperlipidemia   . Hypertension   . Rheumatoid arthritis [facility])        Patient Active Problem List   Diagnosis Date Noted  . Drug-induced pneumonitis 04/02/2015  . COPD (chronic obstructive pulmonary disease) ([place]) 04/02/2015  . Dyspnea 04/02/2015  . COPD exacerbation ([place]) 03/11/2015  . CAFL (chronic airflow limitation) ([place]) 08/14/2014  . HLD (hyperlipidemia) 08/14/2014  . BP (high blood pressure) 08/14/2014  . Diabetes mellitus type 2, uncontrolled ([place]) 08/14/2014  . Chronic kidney disease (CKD), stage III (moderate) 01/27/2014  . 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

## 2015-10-22 NOTE — Progress Notes (Signed)
Patient had no complaints this shift, VSS, no syncopic episodes.  Received MD order to discharge patient to home, reviewed discharge instructions, home meds and prescriptions with patient and patient verbalized understanding,  Patient and family deferred pneumonia vaccine will get at primary MD's office,.  Discharged in wheelchair to home with family

## 2015-10-22 NOTE — Care Management Obs Status (Signed)
[facility] NOTIFICATION   Patient Details  Name: Isaac Stevens MRN: [id number] Date of Birth: 1935-08-27   Medicare Observation Status Notification Given:  Yes    Shelbie Ammons, RN 10/22/2015, 9:56 AM

## 2015-10-22 NOTE — Care Management (Signed)
Admitted to [facility] with the diagnosis of syncope under observation status. Lives with wife, Deloris [phone]). Last seen Dr, Caryl Comes 2 weeks ago. Takes care of all basic and instrumental activities of daily living himself, drives. No home services needed in the past. Family will transport. Discharge to home today per Dr. Lavetta Nielsen. Shelbie Ammons RN MSN CCM Care Management [phone]

## 2015-10-22 NOTE — Discharge Summary (Signed)
[facility] NAME: Corliss Harvan    MR#:  [id number]  DATE OF BIRTH:  [birth year]  DATE OF ADMISSION:  10/21/2015 ADMITTING PHYSICIAN: [name], DO  DATE OF DISCHARGE: 10/22/15  PRIMARY CARE PHYSICIAN: Tama High III, MD    ADMISSION DIAGNOSIS:  Syncope, unspecified syncope type [R55]  DISCHARGE DIAGNOSIS:  Active Problems:   Syncope Hypoglycemia, related to type 2 diabetes  SECONDARY DIAGNOSIS:   Past Medical History:  Diagnosis Date  . CAFL (chronic airflow limitation) (HCC)   . CKD (chronic kidney disease)   . Diabetes mellitus without complication ([place])   . Hyperlipidemia   . Hypertension   . Rheumatoid arthritis [facility])     HOSPITAL COURSE:  Mattheau Rozario  is a 80 y.o. male admitted 10/21/2015 with chief complaint Loss of Consciousness . Please see H&P performed by [name], DO for further information. Patient presented after syncopal episode. Single episode occurred after working outside all day. Placed on telemetry found no cardiac etiology. Noted to have episodes of hypoglycemia which have resolved  DISCHARGE CONDITIONS:   Stable  CONSULTS OBTAINED:    DRUG ALLERGIES:   Allergies  Allergen Reactions  . Methotrexate Derivatives Shortness Of Breath  . Lisinopril Swelling  . Pseudoephedrine Hcl Other (See Comments)    confusion    DISCHARGE MEDICATIONS:   Current Discharge Medication List    CONTINUE these medications which have NOT CHANGED   Details  amLODipine (NORVASC) 5 MG tablet TAKE 1 TABLET BY MOUTH EVERY DAY    cetirizine (ZYRTEC) 10 MG tablet Take 10 mg by mouth daily.    EPIPEN 2-PAK 0.3 MG/0.3ML SOAJ injection See admin instructions. Refills: 5    folic acid (FOLVITE) 1 MG tablet TAKE 1 TABLET (1 MG TOTAL) BY MOUTH ONCE DAILY. Refills: 11    hydroxychloroquine (PLAQUENIL) 200 MG tablet Take 400 mg by mouth daily.    metFORMIN (GLUCOPHAGE) 1000 MG tablet Take  1,000 mg by mouth 2 (two) times daily with a meal.    vitamin B-12 (CYANOCOBALAMIN) 1000 MCG tablet Take 1,000 mcg by mouth daily.      STOP taking these medications     glimepiride (AMARYL) 4 MG tablet      JANUVIA 100 MG tablet      albuterol-ipratropium (COMBIVENT) 18-103 MCG/ACT inhaler      ipratropium-albuterol (DUONEB) 0.5-2.5 (3) MG/3ML SOLN          DISCHARGE INSTRUCTIONS:  Would hold Januvia, Amaryl until seen by regular doctor  DIET:  Diabetic diet  DISCHARGE CONDITION:  Good  ACTIVITY:  Activity as tolerated  OXYGEN:  Home Oxygen: No.   Oxygen Delivery: room air  DISCHARGE LOCATION:  home   If you experience worsening of your admission symptoms, develop shortness of breath, life threatening emergency, suicidal or homicidal thoughts you must seek medical attention immediately by calling 911 or calling your MD immediately  if symptoms less severe.  You Must read complete instructions/literature along with all the possible adverse reactions/side effects for all the Medicines you take and that have been prescribed to you. Take any new Medicines after you have completely understood and accpet all the possible adverse reactions/side effects.   Please note  You were cared for by a hospitalist during your hospital stay. If you have any questions about your discharge medications or the care you received while you were in the hospital after you are discharged, you can call the unit and asked  to speak with the hospitalist on call if the hospitalist that took care of you is not available. Once you are discharged, your primary care physician will handle any further medical issues. Please note that NO REFILLS for any discharge medications will be authorized once you are discharged, as it is imperative that you return to your primary care physician (or establish a relationship with a primary care physician if you do not have one) for your aftercare needs so that they can  reassess your need for medications and monitor your lab values.    On the day of Discharge:   VITAL SIGNS:  Blood pressure 129/73, pulse 67, temperature 97.4 F (36.3 C), temperature source Oral, resp. rate 16, height 5\' 10"  (1.778 m), weight 99.3 kg (219 lb), SpO2 95 %.  I/O:   Intake/Output Summary (Last 24 hours) at 10/22/15 0840 Last data filed at 10/22/15 0607  Gross per 24 hour  Intake              452 ml  Output                0 ml  Net              452 ml    PHYSICAL EXAMINATION:  GENERAL:  80 y.o.-year-old patient lying in the bed with no acute distress.  EYES: Pupils equal, round, reactive to light and accommodation. No scleral icterus. Extraocular muscles intact.  HEENT: Head atraumatic, normocephalic. Oropharynx and nasopharynx clear.  NECK:  Supple, no jugular venous distention. No thyroid enlargement, no tenderness.  LUNGS: Normal breath sounds bilaterally, no wheezing, rales,rhonchi or crepitation. No use of accessory muscles of respiration.  CARDIOVASCULAR: S1, S2 normal. No murmurs, rubs, or gallops.  ABDOMEN: Soft, non-tender, non-distended. Bowel sounds present. No organomegaly or mass.  EXTREMITIES: No pedal edema, cyanosis, or clubbing.  NEUROLOGIC: Cranial nerves II through XII are intact. Muscle strength 5/5 in all extremities. Sensation intact. Gait not checked.  PSYCHIATRIC: The patient is alert and oriented x 3.  SKIN: No obvious rash, lesion, or ulcer.   DATA REVIEW:   CBC  Recent Labs Lab 10/22/15 0157  WBC 8.3  HGB 12.7*  HCT 35.7*  PLT 163    Chemistries   Recent Labs Lab 10/21/15 2130 10/22/15 0157  NA  --  139  K  --  4.0  CL  --  109  CO2  --  24  GLUCOSE  --  87  BUN  --  18  CREATININE  --  1.70*  CALCIUM  --  8.8*  MG 2.1  --     Cardiac Enzymes  Recent Labs Lab 10/22/15 Reading <0.03    Microbiology Results  No results found for this or any previous visit.  RADIOLOGY:  Ct Head Wo  Contrast  Result Date: 10/21/2015 CLINICAL DATA:  Found unresponsive. EXAM: CT HEAD WITHOUT CONTRAST TECHNIQUE: Contiguous axial images were obtained from the base of the skull through the vertex without intravenous contrast. COMPARISON:  None. FINDINGS: Brain: There is no intracranial hemorrhage, mass or evidence of acute infarction. There is mild generalized atrophy. There is mild chronic microvascular ischemic change. Remote lacunar infarction in the left internal capsule genu There is no significant extra-axial fluid collection. No acute intracranial findings are evident. Vascular: No hyperdense vessel or unexpected calcification. Skull: Normal. Negative for fracture or focal lesion. Sinuses/Orbits: No acute finding. IMPRESSION: No acute intracranial findings. There is moderate generalized atrophy and chronic appearing white matter  hypodensities which likely represent small vessel ischemic disease. Remote left-sided lacunar infarction, internal capsule genu. Electronically Signed   By: Andreas Newport M.D.   On: 10/21/2015 22:08     Management plans discussed with the patient, family and they are in agreement.  CODE STATUS:     Code Status Orders        Start     Ordered   10/22/15 0044  Full code  Continuous     10/22/15 0044    Code Status History    Date Active Date Inactive Code Status Order ID Comments User Context   03/11/2015  4:52 PM 03/14/2015  2:12 PM Full Code [id number]  Gladstone Lighter, MD Inpatient      TOTAL TIME TAKING CARE OF THIS PATIENT: 33 minutes.    Maxon Kresse,  Karenann Cai.D on 10/22/2015 at 8:40 AM  Between 7am to 6pm - Pager - [phone]  After 6pm go to [email or web address] - Proofreader  [facility]  Office  [phone]  CC: Primary care physician; Adin Hector, MD

## 2015-11-12 DIAGNOSIS — R55 Syncope and collapse: Secondary | ICD-10-CM | POA: Diagnosis not present

## 2015-11-12 DIAGNOSIS — M064 Inflammatory polyarthropathy: Secondary | ICD-10-CM | POA: Diagnosis not present

## 2015-11-12 DIAGNOSIS — I10 Essential (primary) hypertension: Secondary | ICD-10-CM | POA: Diagnosis not present

## 2015-11-12 DIAGNOSIS — E1165 Type 2 diabetes mellitus with hyperglycemia: Secondary | ICD-10-CM | POA: Diagnosis not present

## 2015-11-12 DIAGNOSIS — E118 Type 2 diabetes mellitus with unspecified complications: Secondary | ICD-10-CM | POA: Diagnosis not present

## 2015-11-12 DIAGNOSIS — J449 Chronic obstructive pulmonary disease, unspecified: Secondary | ICD-10-CM | POA: Diagnosis not present

## 2015-11-12 DIAGNOSIS — E538 Deficiency of other specified B group vitamins: Secondary | ICD-10-CM | POA: Diagnosis not present

## 2015-11-12 DIAGNOSIS — Z23 Encounter for immunization: Secondary | ICD-10-CM | POA: Diagnosis not present

## 2015-11-12 DIAGNOSIS — N183 Chronic kidney disease, stage 3 (moderate): Secondary | ICD-10-CM | POA: Diagnosis not present

## 2015-12-02 DIAGNOSIS — E1165 Type 2 diabetes mellitus with hyperglycemia: Secondary | ICD-10-CM | POA: Diagnosis not present

## 2015-12-02 DIAGNOSIS — I10 Essential (primary) hypertension: Secondary | ICD-10-CM | POA: Diagnosis not present

## 2015-12-02 DIAGNOSIS — E538 Deficiency of other specified B group vitamins: Secondary | ICD-10-CM | POA: Diagnosis not present

## 2015-12-02 DIAGNOSIS — E118 Type 2 diabetes mellitus with unspecified complications: Secondary | ICD-10-CM | POA: Diagnosis not present

## 2015-12-08 DIAGNOSIS — E784 Other hyperlipidemia: Secondary | ICD-10-CM | POA: Diagnosis not present

## 2015-12-08 DIAGNOSIS — E118 Type 2 diabetes mellitus with unspecified complications: Secondary | ICD-10-CM | POA: Diagnosis not present

## 2015-12-08 DIAGNOSIS — N183 Chronic kidney disease, stage 3 (moderate): Secondary | ICD-10-CM | POA: Diagnosis not present

## 2015-12-08 DIAGNOSIS — M064 Inflammatory polyarthropathy: Secondary | ICD-10-CM | POA: Diagnosis not present

## 2015-12-08 DIAGNOSIS — R768 Other specified abnormal immunological findings in serum: Secondary | ICD-10-CM | POA: Diagnosis not present

## 2015-12-08 DIAGNOSIS — E1165 Type 2 diabetes mellitus with hyperglycemia: Secondary | ICD-10-CM | POA: Diagnosis not present

## 2015-12-08 DIAGNOSIS — J449 Chronic obstructive pulmonary disease, unspecified: Secondary | ICD-10-CM | POA: Diagnosis not present

## 2015-12-08 DIAGNOSIS — I10 Essential (primary) hypertension: Secondary | ICD-10-CM | POA: Diagnosis not present

## 2015-12-08 DIAGNOSIS — G8929 Other chronic pain: Secondary | ICD-10-CM | POA: Diagnosis not present

## 2015-12-08 DIAGNOSIS — M25511 Pain in right shoulder: Secondary | ICD-10-CM | POA: Diagnosis not present

## 2015-12-08 DIAGNOSIS — E538 Deficiency of other specified B group vitamins: Secondary | ICD-10-CM | POA: Diagnosis not present

## 2015-12-23 DIAGNOSIS — M1A00X0 Idiopathic chronic gout, unspecified site, without tophus (tophi): Secondary | ICD-10-CM | POA: Diagnosis not present

## 2015-12-23 DIAGNOSIS — M1990 Unspecified osteoarthritis, unspecified site: Secondary | ICD-10-CM | POA: Diagnosis not present

## 2015-12-23 DIAGNOSIS — Z79899 Other long term (current) drug therapy: Secondary | ICD-10-CM | POA: Diagnosis not present

## 2015-12-23 DIAGNOSIS — M25511 Pain in right shoulder: Secondary | ICD-10-CM | POA: Diagnosis not present

## 2016-01-06 DIAGNOSIS — M25511 Pain in right shoulder: Secondary | ICD-10-CM | POA: Diagnosis not present

## 2016-01-06 DIAGNOSIS — M7581 Other shoulder lesions, right shoulder: Secondary | ICD-10-CM | POA: Diagnosis not present

## 2016-03-02 DIAGNOSIS — M1990 Unspecified osteoarthritis, unspecified site: Secondary | ICD-10-CM | POA: Diagnosis not present

## 2016-03-02 DIAGNOSIS — I10 Essential (primary) hypertension: Secondary | ICD-10-CM | POA: Diagnosis not present

## 2016-03-02 DIAGNOSIS — M1A00X0 Idiopathic chronic gout, unspecified site, without tophus (tophi): Secondary | ICD-10-CM | POA: Diagnosis not present

## 2016-03-02 DIAGNOSIS — E1165 Type 2 diabetes mellitus with hyperglycemia: Secondary | ICD-10-CM | POA: Diagnosis not present

## 2016-03-02 DIAGNOSIS — E118 Type 2 diabetes mellitus with unspecified complications: Secondary | ICD-10-CM | POA: Diagnosis not present

## 2016-03-02 DIAGNOSIS — E538 Deficiency of other specified B group vitamins: Secondary | ICD-10-CM | POA: Diagnosis not present

## 2016-03-02 DIAGNOSIS — Z79899 Other long term (current) drug therapy: Secondary | ICD-10-CM | POA: Diagnosis not present

## 2016-03-09 DIAGNOSIS — I10 Essential (primary) hypertension: Secondary | ICD-10-CM | POA: Diagnosis not present

## 2016-03-09 DIAGNOSIS — E118 Type 2 diabetes mellitus with unspecified complications: Secondary | ICD-10-CM | POA: Diagnosis not present

## 2016-03-09 DIAGNOSIS — N183 Chronic kidney disease, stage 3 (moderate): Secondary | ICD-10-CM | POA: Diagnosis not present

## 2016-03-09 DIAGNOSIS — Z85820 Personal history of malignant melanoma of skin: Secondary | ICD-10-CM | POA: Diagnosis not present

## 2016-03-09 DIAGNOSIS — Z9889 Other specified postprocedural states: Secondary | ICD-10-CM | POA: Diagnosis not present

## 2016-03-09 DIAGNOSIS — J449 Chronic obstructive pulmonary disease, unspecified: Secondary | ICD-10-CM | POA: Diagnosis not present

## 2016-03-09 DIAGNOSIS — E784 Other hyperlipidemia: Secondary | ICD-10-CM | POA: Diagnosis not present

## 2016-03-09 DIAGNOSIS — Z0000 Encounter for general adult medical examination without abnormal findings: Secondary | ICD-10-CM | POA: Diagnosis not present

## 2016-03-09 DIAGNOSIS — M064 Inflammatory polyarthropathy: Secondary | ICD-10-CM | POA: Diagnosis not present

## 2016-03-09 DIAGNOSIS — M1A00X0 Idiopathic chronic gout, unspecified site, without tophus (tophi): Secondary | ICD-10-CM | POA: Diagnosis not present

## 2016-03-09 DIAGNOSIS — E538 Deficiency of other specified B group vitamins: Secondary | ICD-10-CM | POA: Diagnosis not present

## 2016-03-09 DIAGNOSIS — E1165 Type 2 diabetes mellitus with hyperglycemia: Secondary | ICD-10-CM | POA: Diagnosis not present

## 2016-03-09 DIAGNOSIS — R768 Other specified abnormal immunological findings in serum: Secondary | ICD-10-CM | POA: Diagnosis not present

## 2016-06-21 DIAGNOSIS — M65342 Trigger finger, left ring finger: Secondary | ICD-10-CM | POA: Diagnosis not present

## 2016-06-21 DIAGNOSIS — M1990 Unspecified osteoarthritis, unspecified site: Secondary | ICD-10-CM | POA: Diagnosis not present

## 2016-06-21 DIAGNOSIS — M1A00X0 Idiopathic chronic gout, unspecified site, without tophus (tophi): Secondary | ICD-10-CM | POA: Diagnosis not present

## 2016-08-26 IMAGING — CT CT ANGIO CHEST
1 of 2 series · 18 of 30 positions shown · IV contrast (APPLIED)
Comparison: Current chest radiographs

CLINICAL DATA: 79 y.o. male with a known history of CKD stage III,
diabetes, hypertension and hyperlipidemia and recent diagnosis of
arthritis and started on methotrexate about 2 months ago was sent in
from PCPs office due to worsening hypoxia and dyspnea for 2 weeks
now.

EXAM:
CT ANGIOGRAPHY CHEST WITH CONTRAST
TECHNIQUE: Multidetector CT imaging of the chest was performed using the
standard protocol during bolus administration of intravenous
contrast. Multiplanar CT image reconstructions and MIPs were
obtained to evaluate the vascular anatomy.
CONTRAST:  75mL OMNIPAQUE IOHEXOL 350 MG/ML SOLN

[Series 5: pe 1.0 thins · axial · 0.84mm/px · z∈[+2,+302]mm · 18 of 338 slices shown]
[im 19/338  lung]
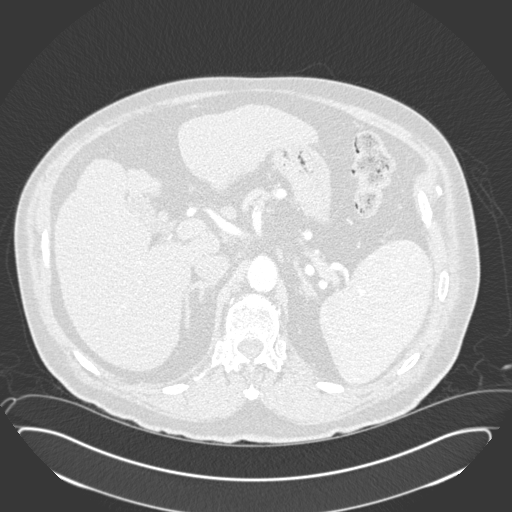
[im 38/338  mediastinal]
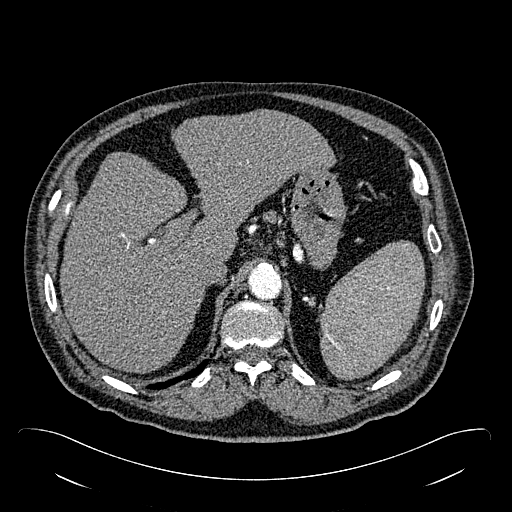
[im 57/338  lung]
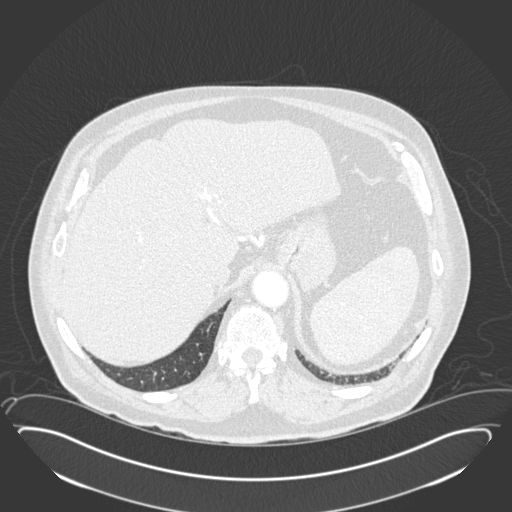
[im 75/338  mediastinal]
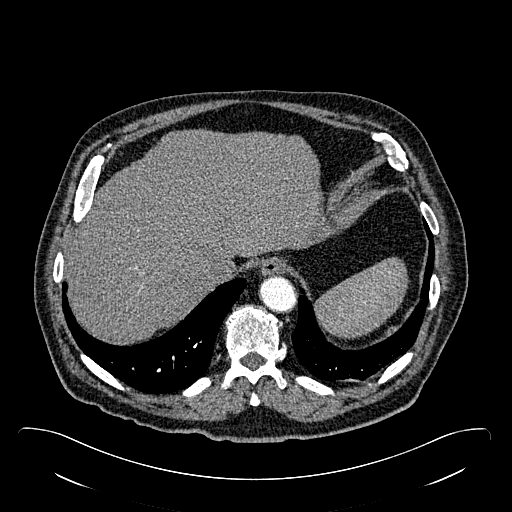
[im 94/338  lung]
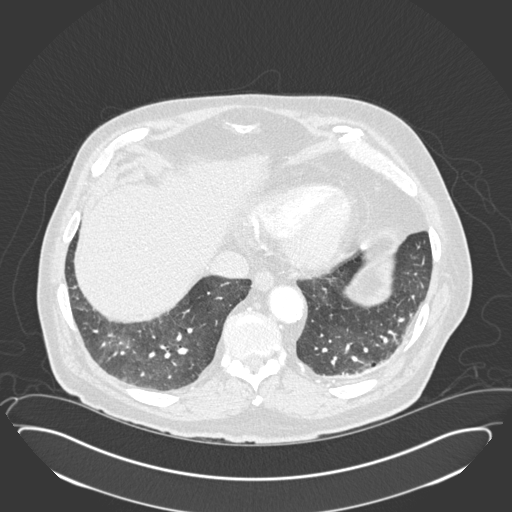
[im 113/338  mediastinal]
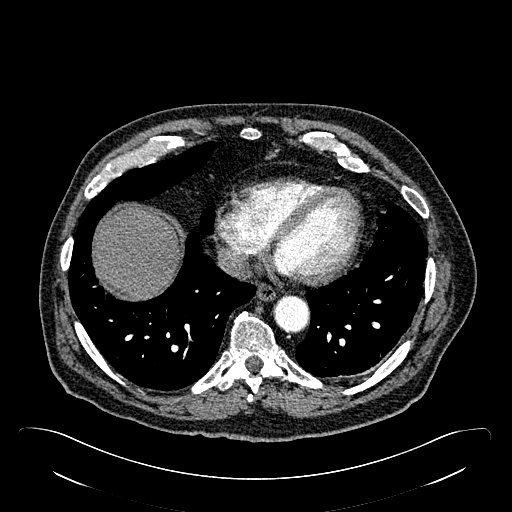
[im 132/338  lung]
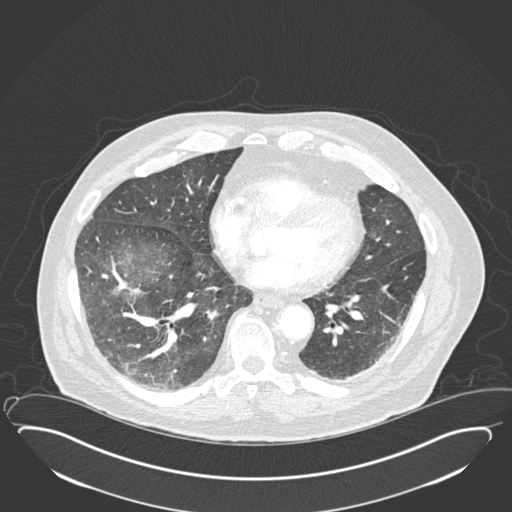
[im 150/338  mediastinal]
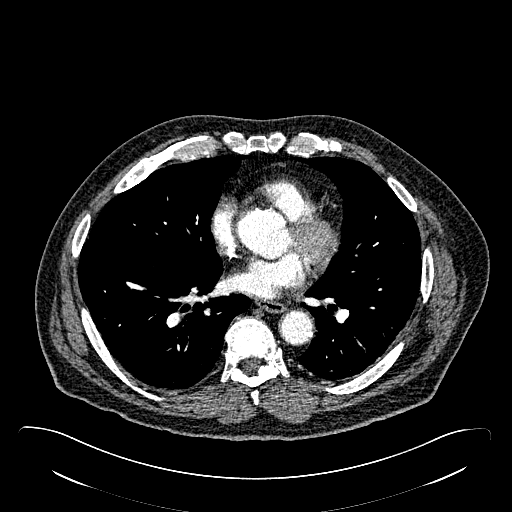
[im 160/338  lung]
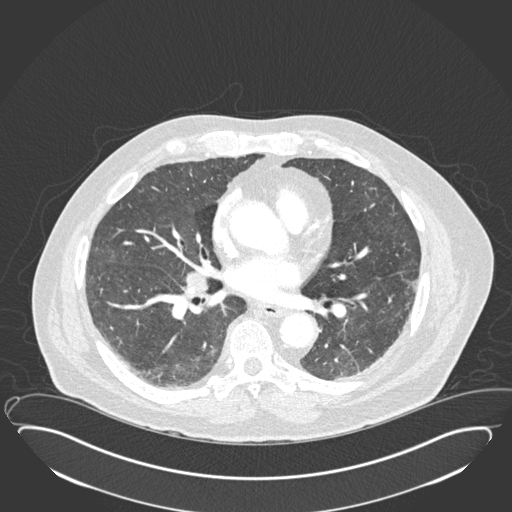
[im 169/338  mediastinal]
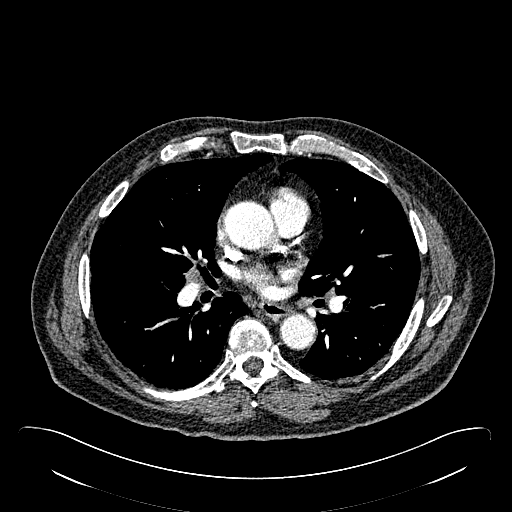
[im 188/338  lung]
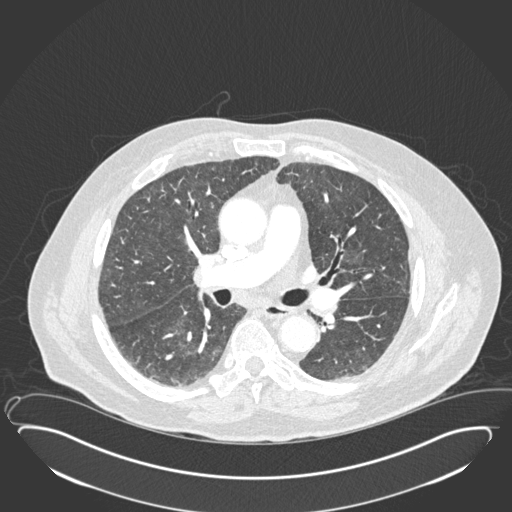
[im 206/338  mediastinal]
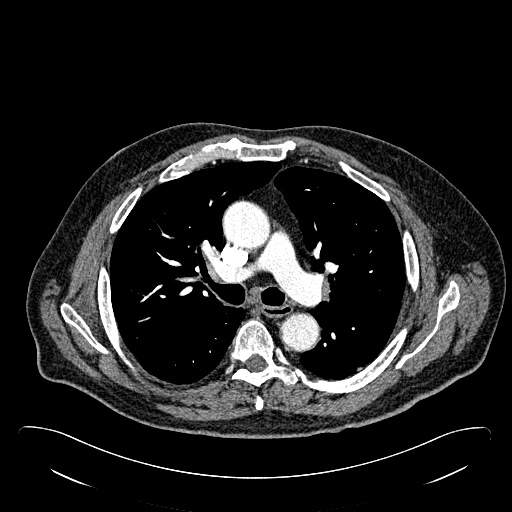
[im 225/338  lung]
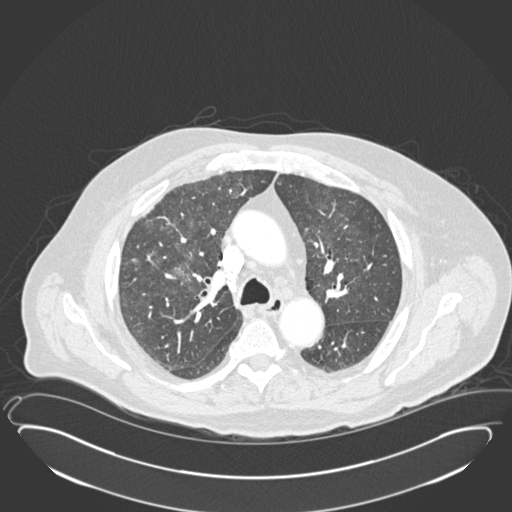
[im 244/338  mediastinal]
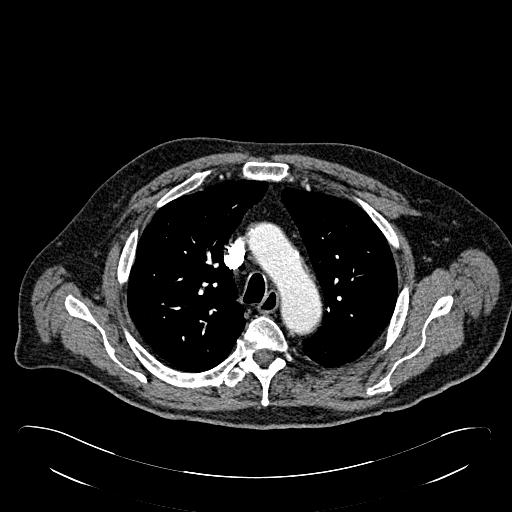
[im 263/338  lung]
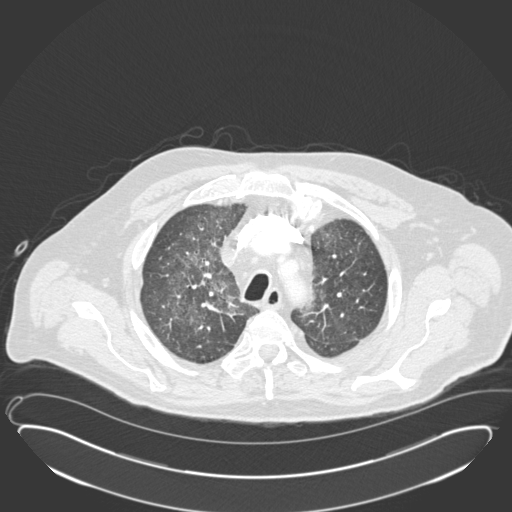
[im 281/338  mediastinal]
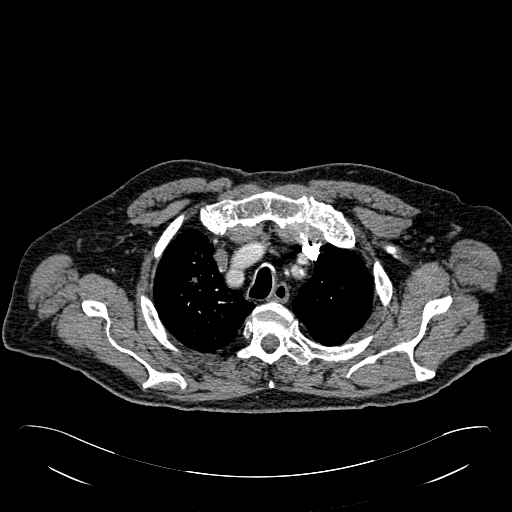
[im 300/338  lung]
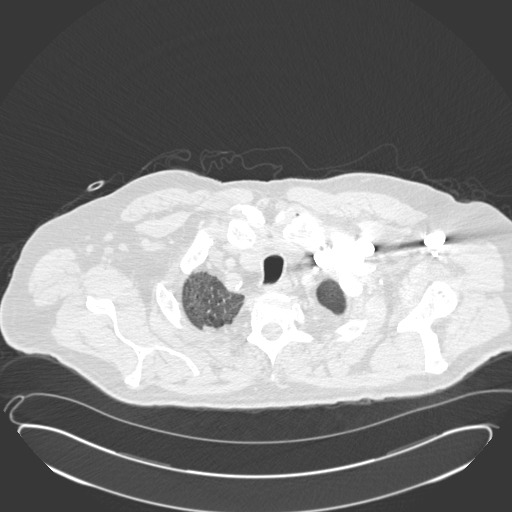
[im 319/338  mediastinal]
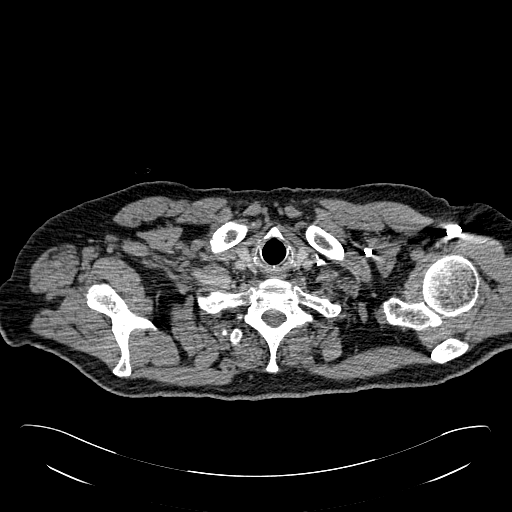

[18 of 30 positions shown; findings below may reference images not displayed]

FINDINGS: Angiographic study: No evidence of a pulmonary embolus. Aorta
demonstrates mild atherosclerotic calcifications. Aorta is mildly
dilated with the ascending portion measuring 4 mm in diameter.
Common origin of the innominate artery and left common carotid
artery from the aortic arch. No aortic dissection.

Neck base and axilla: No mass or adenopathy. Thyroid is
unremarkable.

Mediastinum and hila: Heart normal in size and configuration.
Moderate coronary artery calcifications. There are several prominent
lymph nodes, largest a precarinal node measuring 1 cm in short axis.
There is enlarged right infrahilar node measuring 13 mm in short
axis.

Lungs and pleura: Patchy areas of ground-glass opacity are noted
bilaterally, more prominent on the right than the left. There is
mild interstitial thickening. Minor centrilobular and paraseptal
emphysema is noted in the upper lobes, greater on the right. No
pleural effusion. No pneumothorax.

Limited upper abdomen: Liver shows central volume loss with some
surface nodularity consistent with cirrhosis. No liver mass or focal
lesion. Visualized spleen is mildly enlarged measuring 13 cm in
short axis.

Musculoskeletal:  No osteoblastic or osteolytic lesions.

Review of the MIP images confirms the above findings.
IMPRESSION: 1. No evidence of a pulmonary embolism.
2. Bilateral patchy areas of ground-glass lung opacity are noted,
right greater than left. This could reflect pneumonia, particularly
atypical pneumonia. Given the history, drug toxicity should be
considered.

## 2016-12-22 DIAGNOSIS — M1990 Unspecified osteoarthritis, unspecified site: Secondary | ICD-10-CM | POA: Diagnosis not present

## 2016-12-22 DIAGNOSIS — M1A00X0 Idiopathic chronic gout, unspecified site, without tophus (tophi): Secondary | ICD-10-CM | POA: Diagnosis not present

## 2016-12-22 DIAGNOSIS — M25561 Pain in right knee: Secondary | ICD-10-CM | POA: Diagnosis not present

## 2016-12-22 DIAGNOSIS — G8929 Other chronic pain: Secondary | ICD-10-CM | POA: Diagnosis not present

## 2017-04-17 DIAGNOSIS — H2513 Age-related nuclear cataract, bilateral: Secondary | ICD-10-CM | POA: Diagnosis not present

## 2017-04-17 DIAGNOSIS — E119 Type 2 diabetes mellitus without complications: Secondary | ICD-10-CM | POA: Diagnosis not present

## 2017-06-20 DIAGNOSIS — H2513 Age-related nuclear cataract, bilateral: Secondary | ICD-10-CM | POA: Diagnosis not present

## 2017-06-20 DIAGNOSIS — H25013 Cortical age-related cataract, bilateral: Secondary | ICD-10-CM | POA: Diagnosis not present

## 2017-06-20 DIAGNOSIS — H5703 Miosis: Secondary | ICD-10-CM | POA: Diagnosis not present

## 2017-06-20 DIAGNOSIS — E119 Type 2 diabetes mellitus without complications: Secondary | ICD-10-CM | POA: Diagnosis not present

## 2017-06-21 DIAGNOSIS — M1A00X0 Idiopathic chronic gout, unspecified site, without tophus (tophi): Secondary | ICD-10-CM | POA: Diagnosis not present

## 2017-06-21 DIAGNOSIS — M65341 Trigger finger, right ring finger: Secondary | ICD-10-CM | POA: Diagnosis not present

## 2017-06-21 DIAGNOSIS — M1990 Unspecified osteoarthritis, unspecified site: Secondary | ICD-10-CM | POA: Diagnosis not present

## 2017-07-13 DIAGNOSIS — J40 Bronchitis, not specified as acute or chronic: Secondary | ICD-10-CM | POA: Diagnosis not present

## 2017-07-13 DIAGNOSIS — N62 Hypertrophy of breast: Secondary | ICD-10-CM | POA: Diagnosis not present

## 2017-07-13 DIAGNOSIS — R05 Cough: Secondary | ICD-10-CM | POA: Diagnosis not present

## 2017-07-16 ENCOUNTER — Other Ambulatory Visit: Payer: Self-pay | Admitting: Physician Assistant

## 2017-07-16 DIAGNOSIS — Z1231 Encounter for screening mammogram for malignant neoplasm of breast: Secondary | ICD-10-CM

## 2017-08-07 DIAGNOSIS — H25812 Combined forms of age-related cataract, left eye: Secondary | ICD-10-CM | POA: Diagnosis not present

## 2017-08-07 DIAGNOSIS — H2512 Age-related nuclear cataract, left eye: Secondary | ICD-10-CM | POA: Diagnosis not present

## 2017-08-13 DIAGNOSIS — H2512 Age-related nuclear cataract, left eye: Secondary | ICD-10-CM | POA: Diagnosis not present

## 2017-08-23 DIAGNOSIS — H2511 Age-related nuclear cataract, right eye: Secondary | ICD-10-CM | POA: Diagnosis not present

## 2017-08-23 DIAGNOSIS — H25011 Cortical age-related cataract, right eye: Secondary | ICD-10-CM | POA: Diagnosis not present

## 2017-09-04 DIAGNOSIS — H2511 Age-related nuclear cataract, right eye: Secondary | ICD-10-CM | POA: Diagnosis not present

## 2017-09-04 DIAGNOSIS — H25811 Combined forms of age-related cataract, right eye: Secondary | ICD-10-CM | POA: Diagnosis not present

## 2017-09-10 DIAGNOSIS — H2511 Age-related nuclear cataract, right eye: Secondary | ICD-10-CM | POA: Diagnosis not present

## 2017-10-17 DIAGNOSIS — H903 Sensorineural hearing loss, bilateral: Secondary | ICD-10-CM | POA: Diagnosis not present

## 2017-12-17 DIAGNOSIS — D485 Neoplasm of uncertain behavior of skin: Secondary | ICD-10-CM | POA: Diagnosis not present

## 2017-12-17 DIAGNOSIS — Z08 Encounter for follow-up examination after completed treatment for malignant neoplasm: Secondary | ICD-10-CM | POA: Diagnosis not present

## 2017-12-17 DIAGNOSIS — X32XXXA Exposure to sunlight, initial encounter: Secondary | ICD-10-CM | POA: Diagnosis not present

## 2017-12-17 DIAGNOSIS — L821 Other seborrheic keratosis: Secondary | ICD-10-CM | POA: Diagnosis not present

## 2017-12-17 DIAGNOSIS — Z85828 Personal history of other malignant neoplasm of skin: Secondary | ICD-10-CM | POA: Diagnosis not present

## 2017-12-17 DIAGNOSIS — L570 Actinic keratosis: Secondary | ICD-10-CM | POA: Diagnosis not present

## 2017-12-17 DIAGNOSIS — C44329 Squamous cell carcinoma of skin of other parts of face: Secondary | ICD-10-CM | POA: Diagnosis not present

## 2017-12-18 DIAGNOSIS — M65331 Trigger finger, right middle finger: Secondary | ICD-10-CM | POA: Diagnosis not present

## 2017-12-18 DIAGNOSIS — M1990 Unspecified osteoarthritis, unspecified site: Secondary | ICD-10-CM | POA: Diagnosis not present

## 2017-12-18 DIAGNOSIS — M1A00X0 Idiopathic chronic gout, unspecified site, without tophus (tophi): Secondary | ICD-10-CM | POA: Diagnosis not present

## 2018-02-20 DIAGNOSIS — L814 Other melanin hyperpigmentation: Secondary | ICD-10-CM | POA: Diagnosis not present

## 2018-02-20 DIAGNOSIS — L578 Other skin changes due to chronic exposure to nonionizing radiation: Secondary | ICD-10-CM | POA: Diagnosis not present

## 2018-02-20 DIAGNOSIS — C4492 Squamous cell carcinoma of skin, unspecified: Secondary | ICD-10-CM | POA: Diagnosis not present

## 2018-02-20 DIAGNOSIS — L988 Other specified disorders of the skin and subcutaneous tissue: Secondary | ICD-10-CM | POA: Diagnosis not present

## 2018-02-20 DIAGNOSIS — C44329 Squamous cell carcinoma of skin of other parts of face: Secondary | ICD-10-CM | POA: Diagnosis not present

## 2018-03-25 DIAGNOSIS — E119 Type 2 diabetes mellitus without complications: Secondary | ICD-10-CM | POA: Diagnosis not present

## 2018-06-18 DIAGNOSIS — M65331 Trigger finger, right middle finger: Secondary | ICD-10-CM | POA: Diagnosis not present

## 2018-06-18 DIAGNOSIS — M1990 Unspecified osteoarthritis, unspecified site: Secondary | ICD-10-CM | POA: Diagnosis not present

## 2018-06-18 DIAGNOSIS — M1A00X0 Idiopathic chronic gout, unspecified site, without tophus (tophi): Secondary | ICD-10-CM | POA: Diagnosis not present

## 2018-07-15 DIAGNOSIS — E7849 Other hyperlipidemia: Secondary | ICD-10-CM | POA: Diagnosis not present

## 2018-07-15 DIAGNOSIS — N183 Chronic kidney disease, stage 3 (moderate): Secondary | ICD-10-CM | POA: Diagnosis not present

## 2018-07-15 DIAGNOSIS — J449 Chronic obstructive pulmonary disease, unspecified: Secondary | ICD-10-CM | POA: Diagnosis not present

## 2018-07-15 DIAGNOSIS — S41112A Laceration without foreign body of left upper arm, initial encounter: Secondary | ICD-10-CM | POA: Diagnosis not present

## 2018-07-15 DIAGNOSIS — Z23 Encounter for immunization: Secondary | ICD-10-CM | POA: Diagnosis not present

## 2018-07-15 DIAGNOSIS — M1A00X0 Idiopathic chronic gout, unspecified site, without tophus (tophi): Secondary | ICD-10-CM | POA: Diagnosis not present

## 2018-07-15 DIAGNOSIS — Z9889 Other specified postprocedural states: Secondary | ICD-10-CM | POA: Diagnosis not present

## 2018-07-15 DIAGNOSIS — R768 Other specified abnormal immunological findings in serum: Secondary | ICD-10-CM | POA: Diagnosis not present

## 2018-07-15 DIAGNOSIS — D692 Other nonthrombocytopenic purpura: Secondary | ICD-10-CM | POA: Diagnosis not present

## 2018-07-15 DIAGNOSIS — M064 Inflammatory polyarthropathy: Secondary | ICD-10-CM | POA: Diagnosis not present

## 2018-07-15 DIAGNOSIS — E1165 Type 2 diabetes mellitus with hyperglycemia: Secondary | ICD-10-CM | POA: Diagnosis not present

## 2018-07-15 DIAGNOSIS — E118 Type 2 diabetes mellitus with unspecified complications: Secondary | ICD-10-CM | POA: Diagnosis not present

## 2018-07-15 DIAGNOSIS — E538 Deficiency of other specified B group vitamins: Secondary | ICD-10-CM | POA: Diagnosis not present

## 2018-07-15 DIAGNOSIS — Z0000 Encounter for general adult medical examination without abnormal findings: Secondary | ICD-10-CM | POA: Diagnosis not present

## 2018-07-15 DIAGNOSIS — I10 Essential (primary) hypertension: Secondary | ICD-10-CM | POA: Diagnosis not present

## 2018-10-16 DIAGNOSIS — E7849 Other hyperlipidemia: Secondary | ICD-10-CM | POA: Diagnosis not present

## 2018-10-16 DIAGNOSIS — D692 Other nonthrombocytopenic purpura: Secondary | ICD-10-CM | POA: Diagnosis not present

## 2018-10-16 DIAGNOSIS — J449 Chronic obstructive pulmonary disease, unspecified: Secondary | ICD-10-CM | POA: Diagnosis not present

## 2018-10-16 DIAGNOSIS — N183 Chronic kidney disease, stage 3 (moderate): Secondary | ICD-10-CM | POA: Diagnosis not present

## 2018-10-16 DIAGNOSIS — M064 Inflammatory polyarthropathy: Secondary | ICD-10-CM | POA: Diagnosis not present

## 2018-10-16 DIAGNOSIS — E118 Type 2 diabetes mellitus with unspecified complications: Secondary | ICD-10-CM | POA: Diagnosis not present

## 2018-10-16 DIAGNOSIS — M1A00X0 Idiopathic chronic gout, unspecified site, without tophus (tophi): Secondary | ICD-10-CM | POA: Diagnosis not present

## 2018-10-16 DIAGNOSIS — E1165 Type 2 diabetes mellitus with hyperglycemia: Secondary | ICD-10-CM | POA: Diagnosis not present

## 2018-10-16 DIAGNOSIS — I10 Essential (primary) hypertension: Secondary | ICD-10-CM | POA: Diagnosis not present

## 2018-10-16 DIAGNOSIS — E538 Deficiency of other specified B group vitamins: Secondary | ICD-10-CM | POA: Diagnosis not present

## 2018-11-19 ENCOUNTER — Other Ambulatory Visit: Payer: Self-pay

## 2018-11-19 ENCOUNTER — Inpatient Hospital Stay: Payer: PPO

## 2018-11-19 ENCOUNTER — Inpatient Hospital Stay
Admission: EM | Admit: 2018-11-19 | Discharge: 2018-11-20 | DRG: 683 | Disposition: A | Discharge status: Home / Self Care | Payer: PPO | Attending: Internal Medicine | Admitting: Internal Medicine

## 2018-11-19 ENCOUNTER — Emergency Department: Payer: PPO

## 2018-11-19 DIAGNOSIS — Z888 Allergy status to other drugs, medicaments and biological substances status: Secondary | ICD-10-CM | POA: Diagnosis not present

## 2018-11-19 DIAGNOSIS — J449 Chronic obstructive pulmonary disease, unspecified: Secondary | ICD-10-CM | POA: Diagnosis present

## 2018-11-19 DIAGNOSIS — N400 Enlarged prostate without lower urinary tract symptoms: Secondary | ICD-10-CM | POA: Diagnosis present

## 2018-11-19 DIAGNOSIS — E785 Hyperlipidemia, unspecified: Secondary | ICD-10-CM | POA: Diagnosis not present

## 2018-11-19 DIAGNOSIS — E1122 Type 2 diabetes mellitus with diabetic chronic kidney disease: Secondary | ICD-10-CM | POA: Diagnosis not present

## 2018-11-19 DIAGNOSIS — Z20828 Contact with and (suspected) exposure to other viral communicable diseases: Secondary | ICD-10-CM | POA: Diagnosis present

## 2018-11-19 DIAGNOSIS — I6523 Occlusion and stenosis of bilateral carotid arteries: Secondary | ICD-10-CM | POA: Diagnosis not present

## 2018-11-19 DIAGNOSIS — G459 Transient cerebral ischemic attack, unspecified: Secondary | ICD-10-CM | POA: Diagnosis present

## 2018-11-19 DIAGNOSIS — R29810 Facial weakness: Secondary | ICD-10-CM | POA: Diagnosis not present

## 2018-11-19 DIAGNOSIS — R29702 NIHSS score 2: Secondary | ICD-10-CM | POA: Diagnosis not present

## 2018-11-19 DIAGNOSIS — I129 Hypertensive chronic kidney disease with stage 1 through stage 4 chronic kidney disease, or unspecified chronic kidney disease: Secondary | ICD-10-CM | POA: Diagnosis not present

## 2018-11-19 DIAGNOSIS — I639 Cerebral infarction, unspecified: Secondary | ICD-10-CM | POA: Diagnosis not present

## 2018-11-19 DIAGNOSIS — R42 Dizziness and giddiness: Secondary | ICD-10-CM | POA: Diagnosis not present

## 2018-11-19 DIAGNOSIS — M109 Gout, unspecified: Secondary | ICD-10-CM | POA: Diagnosis present

## 2018-11-19 DIAGNOSIS — Z7984 Long term (current) use of oral hypoglycemic drugs: Secondary | ICD-10-CM | POA: Diagnosis not present

## 2018-11-19 DIAGNOSIS — Z79899 Other long term (current) drug therapy: Secondary | ICD-10-CM

## 2018-11-19 DIAGNOSIS — F17220 Nicotine dependence, chewing tobacco, uncomplicated: Secondary | ICD-10-CM | POA: Diagnosis present

## 2018-11-19 DIAGNOSIS — R471 Dysarthria and anarthria: Secondary | ICD-10-CM | POA: Diagnosis not present

## 2018-11-19 DIAGNOSIS — R531 Weakness: Secondary | ICD-10-CM | POA: Diagnosis not present

## 2018-11-19 DIAGNOSIS — I10 Essential (primary) hypertension: Secondary | ICD-10-CM | POA: Diagnosis not present

## 2018-11-19 DIAGNOSIS — R279 Unspecified lack of coordination: Secondary | ICD-10-CM | POA: Diagnosis not present

## 2018-11-19 DIAGNOSIS — M1990 Unspecified osteoarthritis, unspecified site: Secondary | ICD-10-CM | POA: Diagnosis present

## 2018-11-19 DIAGNOSIS — N183 Chronic kidney disease, stage 3 unspecified: Secondary | ICD-10-CM | POA: Diagnosis present

## 2018-11-19 DIAGNOSIS — E118 Type 2 diabetes mellitus with unspecified complications: Secondary | ICD-10-CM | POA: Diagnosis not present

## 2018-11-19 DIAGNOSIS — R519 Headache, unspecified: Secondary | ICD-10-CM | POA: Diagnosis not present

## 2018-11-19 DIAGNOSIS — R4701 Aphasia: Secondary | ICD-10-CM | POA: Diagnosis present

## 2018-11-19 DIAGNOSIS — R7989 Other specified abnormal findings of blood chemistry: Secondary | ICD-10-CM | POA: Diagnosis not present

## 2018-11-19 DIAGNOSIS — Z03818 Encounter for observation for suspected exposure to other biological agents ruled out: Secondary | ICD-10-CM | POA: Diagnosis not present

## 2018-11-19 DIAGNOSIS — R4781 Slurred speech: Secondary | ICD-10-CM | POA: Diagnosis not present

## 2018-11-19 DIAGNOSIS — M069 Rheumatoid arthritis, unspecified: Secondary | ICD-10-CM | POA: Diagnosis present

## 2018-11-19 DIAGNOSIS — E1165 Type 2 diabetes mellitus with hyperglycemia: Secondary | ICD-10-CM | POA: Diagnosis not present

## 2018-11-19 DIAGNOSIS — R270 Ataxia, unspecified: Secondary | ICD-10-CM | POA: Diagnosis not present

## 2018-11-19 DIAGNOSIS — I6389 Other cerebral infarction: Secondary | ICD-10-CM | POA: Diagnosis not present

## 2018-11-19 DIAGNOSIS — R2681 Unsteadiness on feet: Secondary | ICD-10-CM | POA: Diagnosis not present

## 2018-11-19 LAB — DIFFERENTIAL
Abs Immature Granulocytes: 0.04 10*3/uL (ref 0.00–0.07)
Basophils Absolute: 0.1 10*3/uL (ref 0.0–0.1)
Basophils Relative: 2 %
Eosinophils Absolute: 0.2 10*3/uL (ref 0.0–0.5)
Eosinophils Relative: 3 %
Immature Granulocytes: 1 %
Lymphocytes Relative: 15 %
Lymphs Abs: 0.8 10*3/uL (ref 0.7–4.0)
Monocytes Absolute: 0.4 10*3/uL (ref 0.1–1.0)
Monocytes Relative: 8 %
Neutro Abs: 3.8 10*3/uL (ref 1.7–7.7)
Neutrophils Relative %: 71 %

## 2018-11-19 LAB — PROTIME-INR
INR: 1.1 (ref 0.8–1.2)
Prothrombin Time: 13.8 seconds (ref 11.4–15.2)

## 2018-11-19 LAB — CBC
HCT: 35.7 % — ABNORMAL LOW (ref 39.0–52.0)
Hemoglobin: 12.1 g/dL — ABNORMAL LOW (ref 13.0–17.0)
MCH: 32.7 pg (ref 26.0–34.0)
MCHC: 33.9 g/dL (ref 30.0–36.0)
MCV: 96.5 fL (ref 80.0–100.0)
Platelets: 162 10*3/uL (ref 150–400)
RBC: 3.7 MIL/uL — ABNORMAL LOW (ref 4.22–5.81)
RDW: 13 % (ref 11.5–15.5)
WBC: 5.3 10*3/uL (ref 4.0–10.5)
nRBC: 0 % (ref 0.0–0.2)

## 2018-11-19 LAB — GLUCOSE, CAPILLARY: Glucose-Capillary: 111 mg/dL — ABNORMAL HIGH (ref 70–99)

## 2018-11-19 LAB — COMPREHENSIVE METABOLIC PANEL
ALT: 12 U/L (ref 0–44)
AST: 16 U/L (ref 15–41)
Albumin: 3.8 g/dL (ref 3.5–5.0)
Alkaline Phosphatase: 55 U/L (ref 38–126)
Anion gap: 12 (ref 5–15)
BUN: 17 mg/dL (ref 8–23)
CO2: 24 mmol/L (ref 22–32)
Calcium: 9.1 mg/dL (ref 8.9–10.3)
Chloride: 103 mmol/L (ref 98–111)
Creatinine, Ser: 1.71 mg/dL — ABNORMAL HIGH (ref 0.61–1.24)
GFR calc Af Amer: 43 mL/min — ABNORMAL LOW (ref >60)
GFR calc non Af Amer: 37 mL/min — ABNORMAL LOW (ref >60)
Glucose, Bld: 138 mg/dL — ABNORMAL HIGH (ref 70–99)
Potassium: 4.4 mmol/L (ref 3.5–5.1)
Sodium: 139 mmol/L (ref 135–145)
Total Bilirubin: 0.6 mg/dL (ref 0.3–1.2)
Total Protein: 7 g/dL (ref 6.5–8.1)

## 2018-11-19 LAB — HEMOGLOBIN A1C
Hgb A1c MFr Bld: 6.1 % — ABNORMAL HIGH (ref 4.8–5.6)
Mean Plasma Glucose: 128.37 mg/dL

## 2018-11-19 LAB — APTT: aPTT: 38 seconds — ABNORMAL HIGH (ref 24–36)

## 2018-11-19 MED ORDER — HYDROXYCHLOROQUINE SULFATE 200 MG PO TABS
200.0000 mg | ORAL_TABLET | Freq: Every day | ORAL | Status: DC
  Administered 2018-11-20: 200 mg via ORAL
  Filled 2018-11-19: qty 1

## 2018-11-19 MED ORDER — SODIUM CHLORIDE 0.9% FLUSH
3.0000 mL | Freq: Once | INTRAVENOUS | Status: DC
Start: 2018-11-19 — End: 2018-11-19

## 2018-11-19 MED ORDER — LORATADINE 10 MG PO TABS
10.0000 mg | ORAL_TABLET | Freq: Every day | ORAL | Status: DC
  Administered 2018-11-20: 11:00:00 10 mg via ORAL
  Filled 2018-11-19: qty 1

## 2018-11-19 MED ORDER — INSULIN ASPART 100 UNIT/ML ~~LOC~~ SOLN
0.0000 [IU] | Freq: Three times a day (TID) | SUBCUTANEOUS | Status: DC
  Administered 2018-11-20: 13:00:00 1 [IU] via SUBCUTANEOUS
  Filled 2018-11-19: qty 1

## 2018-11-19 MED ORDER — INSULIN ASPART 100 UNIT/ML ~~LOC~~ SOLN: 0.0000 [IU] | Freq: Every day | SUBCUTANEOUS | Status: DC

## 2018-11-19 MED ORDER — AMLODIPINE BESYLATE 5 MG PO TABS: 5.0000 mg | ORAL_TABLET | Freq: Every day | ORAL | Status: DC

## 2018-11-19 MED ORDER — ALLOPURINOL 100 MG PO TABS
100.0000 mg | ORAL_TABLET | Freq: Every day | ORAL | Status: DC
  Administered 2018-11-20: 100 mg via ORAL
  Filled 2018-11-19: qty 1

## 2018-11-19 MED ORDER — FOLIC ACID 1 MG PO TABS
1.0000 mg | ORAL_TABLET | Freq: Every day | ORAL | Status: DC
  Administered 2018-11-20: 11:00:00 1 mg via ORAL
  Filled 2018-11-19: qty 1

## 2018-11-19 MED ORDER — VITAMIN B-12 1000 MCG PO TABS
1000.0000 ug | ORAL_TABLET | Freq: Every day | ORAL | Status: DC
  Administered 2018-11-20: 11:00:00 1000 ug via ORAL
  Filled 2018-11-19: qty 1

## 2018-11-19 MED ORDER — ASPIRIN 81 MG PO CHEW
324.0000 mg | CHEWABLE_TABLET | Freq: Once | ORAL | Status: AC
  Administered 2018-11-19: 324 mg via ORAL
  Filled 2018-11-19: qty 4

## 2018-11-19 MED ORDER — TAMSULOSIN HCL 0.4 MG PO CAPS
0.4000 mg | ORAL_CAPSULE | Freq: Every evening | ORAL | Status: DC
  Administered 2018-11-20: 17:00:00 0.4 mg via ORAL
  Filled 2018-11-19: qty 1

## 2018-11-19 MED ORDER — STROKE: EARLY STAGES OF RECOVERY BOOK: Freq: Once | Status: DC

## 2018-11-19 MED ORDER — ATORVASTATIN CALCIUM 20 MG PO TABS
80.0000 mg | ORAL_TABLET | Freq: Every day | ORAL | Status: DC
  Administered 2018-11-20: 80 mg via ORAL
  Filled 2018-11-19: qty 4

## 2018-11-19 MED ORDER — ASPIRIN EC 81 MG PO TBEC
81.0000 mg | DELAYED_RELEASE_TABLET | Freq: Every day | ORAL | Status: DC
  Administered 2018-11-20: 11:00:00 81 mg via ORAL
  Filled 2018-11-19: qty 1

## 2018-11-19 NOTE — ED Notes (Signed)
Pt in MRI.

## 2018-11-19 NOTE — H&P (Signed)
[facility] [place] NAME: Isaac Stevens    MR#:  [id number]  DATE OF BIRTH:  05/23/1935  DATE OF ADMISSION:  11/19/2018  PRIMARY CARE PHYSICIAN: Adin Hector, MD   REQUESTING/REFERRING PHYSICIAN: Dr Carrie Mew  CHIEF COMPLAINT:   Chief Complaint  Patient presents with  . Code Stroke    HISTORY OF PRESENT ILLNESS:  Jawad Giacomelli  is a 83 y.o. male started developing some dizziness yesterday around 46- 1pm.  Today around 10 AM he was not walking well and he was stumbling around and still feeling dizzy.  His right arm and right leg were having difficulty moving the way he wanted to.  He also had some slurred speech.  He went to his doctor's office this afternoon and really had some weakness on his right side and was sent to the ER for further evaluation.  In the ER he was seen by telemetry neurology and they recommended aspirin and stroke work-up.  Hospitalist services were contacted for further evaluation.  PAST MEDICAL HISTORY:   Past Medical History:  Diagnosis Date  . CAFL (chronic airflow limitation) (HCC)   . CKD (chronic kidney disease)   . Diabetes mellitus without complication ([place])   . Hyperlipidemia   . Hypertension   . Rheumatoid arthritis ([place])     PAST SURGICAL HISTORY:   Past Surgical History:  Procedure Laterality Date  . TOE SURGERY     right foot    SOCIAL HISTORY:   Social History   Tobacco Use  . Smoking status: Former Research scientist (life sciences)  . Smokeless tobacco: Current User    Types: Chew  . Tobacco comment: quit 20 years ago , chews tobacco  Substance Use Topics  . Alcohol use: No    Alcohol/week: 0.0 standard drinks    FAMILY HISTORY:   Family History  Problem Relation Age of Onset  . Prostate cancer Father   . Parkinson's disease Mother     DRUG ALLERGIES:   Allergies  Allergen Reactions  . Methotrexate Derivatives Shortness Of Breath  . Lisinopril Swelling  . Pseudoephedrine  Hcl Other (See Comments)    confusion    REVIEW OF SYSTEMS:  CONSTITUTIONAL: No fever, chills or sweats.  No fatigue.  Positive for right-sided incoordination and weakness.  EYES: No blurred or double vision.  EARS, NOSE, AND THROAT: No tinnitus or ear pain. No sore throat.  No trouble swallowing. RESPIRATORY: No cough, shortness of breath, wheezing or hemoptysis.  CARDIOVASCULAR: No chest pain, orthopnea, edema.  GASTROINTESTINAL: No nausea, vomiting, diarrhea or abdominal pain. No blood in bowel movements GENITOURINARY: No dysuria, hematuria.  ENDOCRINE: No polyuria, nocturia,  HEMATOLOGY: No anemia, easy bruising or bleeding SKIN: No rash or lesion. MUSCULOSKELETAL: History of joint pain.   NEUROLOGIC: Positive for right-sided incoordination and trouble walking and slurred speech. PSYCHIATRY: No anxiety or depression.   MEDICATIONS AT HOME:   Prior to Admission medications   Medication Sig Start Date End Date Taking? Authorizing Provider  allopurinol (ZYLOPRIM) 100 MG tablet Take 100 mg by mouth daily. 11/04/18  Yes [provider]  tamsulosin (FLOMAX) 0.4 MG CAPS capsule Take 0.4 mg by mouth every evening. Take 30 minutes after same meal each day 07/15/18 07/15/19 Yes [provider]  amLODipine (NORVASC) 5 MG tablet TAKE 1 TABLET BY MOUTH EVERY DAY 04/23/14   [provider]  cetirizine (ZYRTEC) 10 MG tablet Take 10 mg by mouth daily.    [provider]  EPIPEN 2-PAK 0.3 MG/0.3ML SOAJ injection See admin instructions. 07/31/14   [provider]  folic acid (FOLVITE) 1 MG tablet TAKE 1 TABLET (1 MG TOTAL) BY MOUTH ONCE DAILY. 03/11/15   [provider]  hydroxychloroquine (PLAQUENIL) 200 MG tablet Take 400 mg by mouth daily.    [provider]  metFORMIN (GLUCOPHAGE) 1000 MG tablet Take 1,000 mg by mouth 2 (two) times daily with a meal.    [provider]  vitamin B-12 (CYANOCOBALAMIN) 1000 MCG tablet Take 1,000 mcg by  mouth daily.    [provider]      VITAL SIGNS:  Blood pressure 130/67, pulse 73, temperature 97.9 F (36.6 C), temperature source Oral, resp. rate 16, height 5\' 8"  (1.727 m), weight 101.3 kg, SpO2 99 %.  PHYSICAL EXAMINATION:  GENERAL:  83 y.o.-year-old patient lying in the bed with no acute distress.  EYES: Pupils equal, round, reactive to light and accommodation. No scleral icterus. Extraocular muscles intact.  HEENT: Head atraumatic, normocephalic. Oropharynx and nasopharynx clear.  NECK:  Supple, no jugular venous distention. No thyroid enlargement, no tenderness.  LUNGS: Normal breath sounds bilaterally, no wheezing, rales,rhonchi or crepitation. No use of accessory muscles of respiration.  CARDIOVASCULAR: S1, S2 normal. No murmurs, rubs, or gallops.  ABDOMEN: Soft, nontender, nondistended. Bowel sounds present. No organomegaly or mass.  EXTREMITIES: No pedal edema, cyanosis, or clubbing.  NEUROLOGIC: Patient with some slurred speech and slight right facial droop. Muscle strength 5/5 in all extremities. Sensation intact. Gait not checked.  Heel shin intact bilaterally but a little harder to do with the right leg.  Finger nose impaired with the right hand.  Good with the left hand. PSYCHIATRIC: The patient is alert and oriented x 3.  SKIN: No rash, lesion, or ulcer.   LABORATORY PANEL:   CBC Recent Labs  Lab 11/19/18 1713  WBC 5.3  HGB 12.1*  HCT 35.7*  PLT 162   ------------------------------------------------------------------------------------------------------------------  Chemistries  Recent Labs  Lab 11/19/18 1713  NA 139  K 4.4  CL 103  CO2 24  GLUCOSE 138*  BUN 17  CREATININE 1.71*  CALCIUM 9.1  AST 16  ALT 12  ALKPHOS 55  BILITOT 0.6   ------------------------------------------------------------------------------------------------------------------  RADIOLOGY:  Ct Head Code Stroke Wo Contrast  Result Date: 11/19/2018 CLINICAL DATA:   Code stroke. Headache, chronic, neuro deficit, possible stroke. Additional history provided: Right-sided facial droop, slurred speech since 10 a.m. today . EXAM: CT HEAD WITHOUT CONTRAST TECHNIQUE: Contiguous axial images were obtained from the base of the skull through the vertex without intravenous contrast. COMPARISON:  Head CT 10/22/2015 FINDINGS: Brain: There is no acute intracranial hemorrhage or demarcated cortical infarction. No evidence of intracranial mass. No midline shift or extra-axial fluid collection. Chronic bilateral basal ganglia lacunar infarcts unchanged from prior head CT 10/21/2015. Ill-defined hypoattenuation of the cerebral white matter consistent with chronic small vessel ischemic disease. Mild-to-moderate generalized parenchymal atrophy. Vascular: No hyperdense vessel. Atherosclerotic calcification of the carotid artery siphons and vertebrobasilar system. Skull: No calvarial fracture or suspicious osseous lesion Sinuses/Orbits: Visualized orbits demonstrate no acute abnormality. No significant paranasal sinus disease or mastoid effusion at the imaged levels. ASPECTS ([place] Stroke Program Early CT Score) - Ganglionic level infarction (caudate, lentiform nuclei, internal capsule, insula, M1-M3 cortex): 7 - Supraganglionic infarction (M4-M6 cortex): 3 Total score (0-10 with 10 being normal): 10 (when discounting chronic infarcts) These results were called by telephone at the time of interpretation on 11/19/2018 at 5:05 pm to provider PHILLIP  STAFFORD , who verbally acknowledged these results. IMPRESSION: 1. No acute intracranial hemorrhage or demarcated cortical infarction. 2. Generalized atrophy with chronic small vessel ischemic disease. 3. Chronic bilateral basal ganglia lacunar infarcts unchanged from prior head CT 10/21/2015. Electronically Signed   By: Kellie Simmering   On: 11/19/2018 17:09    EKG:   Normal sinus rhythm 63 bpm, right superior axis deviation, right ventricular  hypertrophy  IMPRESSION AND PLAN:   1.  Right-sided incoordination and slurred speech.  Initial CAT scan did not show a stroke.  I am concerned that he may have a cerebellar stroke.  Start aspirin.  MRI of the brain, carotid ultrasound and echocardiogram.  Monitor on telemetry.  Start Lipitor.  Check a lipid profile.  PT, OT and speech therapy consultations.  Neuro consult for tomorrow morning. 2.  Hypertension.  Allow permissive hypertension and hold Norvasc for now. 3.  Type 2 diabetes mellitus with chronic kidney disease stage III.  Hold Glucophage and put on sliding scale. 4.  BPH on Flomax 5.  Arthritis on Plaquenil and folic acid 6.  History of gout on allopurinol   All the records are reviewed and case discussed with ED provider. Management plans discussed with the patient, family and they are in agreement.  CODE STATUS: full code  TOTAL TIME TAKING CARE OF THIS PATIENT: 50 minutes, including ACP time.    Loletha Grayer M.D on 11/19/2018 at 6:01 PM  Between 7am to 6pm - Pager - [phone]  After 6pm call admission pager [phone]  [facility] Office  [phone]  CC: Primary care physician; Adin Hector, MD

## 2018-11-19 NOTE — ED Triage Notes (Signed)
PT to ED via  POV from home. CC of trouble walking. PT drove his wife to a dr's appt today and was fine, while at the appt began walking funny. approx 1p began slurred speech and facial droop at approx 2p. PT is alert and oriented.

## 2018-11-19 NOTE — ED Provider Notes (Signed)
[facility] Emergency Department Provider Note  ____________________________________________  Time seen: Approximately 5:33 PM  I have reviewed the triage vital signs and the nursing notes.   HISTORY  Chief Complaint Code Stroke    HPI Isaac Stevens is a 83 y.o. male with a history of diabetes hypertension rheumatoid arthritis CKD who comes the ED today due to right-sided weakness.  Reports having gait unsteadiness for the past 2 possibly 3 days, and then this morning at about 10 AM he started having slurred speech, right-sided weakness and facial droop.  This has been constant since then.  Patient denies falls or trauma, no fevers chills or neck pain.  No aggravating or alleviating factors.      Past Medical History:  Diagnosis Date  . CAFL (chronic airflow limitation) (HCC)   . CKD (chronic kidney disease)   . Diabetes mellitus without complication ([place])   . Hyperlipidemia   . Hypertension   . Rheumatoid arthritis [facility])      Patient Active Problem List   Diagnosis Date Noted  . Syncope 10/21/2015  . Drug-induced pneumonitis 04/02/2015  . COPD (chronic obstructive pulmonary disease) ([place]) 04/02/2015  . Dyspnea 04/02/2015  . COPD exacerbation ([place]) 03/11/2015  . CAFL (chronic airflow limitation) ([place]) 08/14/2014  . HLD (hyperlipidemia) 08/14/2014  . BP (high blood pressure) 08/14/2014  . Diabetes mellitus type 2, uncontrolled ([place]) 08/14/2014  . Chronic kidney disease (CKD), stage III (moderate) 01/27/2014  . 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 TOE SURGERY     right foot     Prior to Admission medications   Medication Sig Start Date End Date Taking? Authorizing Provider  allopurinol (ZYLOPRIM) 100 MG tablet Take 100 mg by mouth daily. 11/04/18  Yes [provider]  tamsulosin (FLOMAX) 0.4 MG CAPS capsule Take 0.4 mg by mouth every evening. Take 30 minutes  after same meal each day 07/15/18 07/15/19 Yes [provider]  amLODipine (NORVASC) 5 MG tablet TAKE 1 TABLET BY MOUTH EVERY DAY 04/23/14   [provider]  cetirizine (ZYRTEC) 10 MG tablet Take 10 mg by mouth daily.    [provider]  EPIPEN 2-PAK 0.3 MG/0.3ML SOAJ injection See admin instructions. 07/31/14   [provider]  folic acid (FOLVITE) 1 MG tablet TAKE 1 TABLET (1 MG TOTAL) BY MOUTH ONCE DAILY. 03/11/15   [provider]  hydroxychloroquine (PLAQUENIL) 200 MG tablet Take 400 mg by mouth daily.    [provider]  metFORMIN (GLUCOPHAGE) 1000 MG tablet Take 1,000 mg by mouth 2 (two) times daily with a meal.    [provider]  vitamin B-12 (CYANOCOBALAMIN) 1000 MCG tablet Take 1,000 mcg by mouth daily.    [provider]     Allergies Methotrexate derivatives, Lisinopril, and Pseudoephedrine hcl   Family History  Problem Relation Age of Onset  . Prostate cancer Father     Social History Social History   Tobacco Use  . Smoking status: Former Research scientist (life sciences)  . Smokeless tobacco: Current User    Types: Chew  . Tobacco comment: quit 20 years ago , chews tobacco  Substance Use Topics  . Alcohol use: No    Alcohol/week: 0.0 standard drinks  . Drug use: No    Review of Systems  Constitutional:   No fever or chills.  ENT:   No sore throat. No rhinorrhea. Cardiovascular:   No chest pain or syncope. Respiratory:  No dyspnea or cough. Gastrointestinal:   Negative for abdominal pain, vomiting and diarrhea.  Musculoskeletal:   Negative for focal pain or swelling All other systems reviewed and are negative except as documented above in ROS and HPI.  ____________________________________________   PHYSICAL EXAM:  VITAL SIGNS: ED Triage Vitals  Enc Vitals Group     BP 11/19/18 1645 130/67     Pulse Rate 11/19/18 1645 73     Resp 11/19/18 1645 16     Temp 11/19/18 1645 97.9 F (36.6 C)     Temp Source 11/19/18  1645 Oral     SpO2 11/19/18 1645 99 %     Weight 11/19/18 1707 223 lb 5.2 oz (101.3 kg)     Height 11/19/18 1707 5\' 8"  (1.727 m)     Head Circumference --      Peak Flow --      Pain Score --      Pain Loc --      Pain Edu? --      Excl. in [place]? --     Vital signs reviewed, nursing assessments reviewed.   Constitutional:   Alert and oriented. Non-toxic appearance. Eyes:   Conjunctivae are normal. EOMI. PERRL. ENT      Head:   Normocephalic and atraumatic.      Nose:   Wearing a mask.      Mouth/Throat:   Wearing a mask.      Neck:   No meningismus. Full ROM. Hematological/Lymphatic/Immunilogical:   No cervical lymphadenopathy. Cardiovascular:   RRR. Symmetric bilateral radial and DP pulses.  No murmurs. Cap refill less than 2 seconds. Respiratory:   Normal respiratory effort without tachypnea/retractions. Breath sounds are clear and equal bilaterally. No wheezes/rales/rhonchi. Gastrointestinal:   Soft and nontender. Non distended. There is no CVA tenderness.  No rebound, rigidity, or guarding.  Musculoskeletal:   Normal range of motion in all extremities. No joint effusions.  No lower extremity tenderness.  No edema. Neurologic:   Dysarthric speech Right facial droop Slight right arm and right leg weakness compared to left. NIH stroke scale 2 Skin:    Skin is warm, dry and intact. No rash noted.  No petechiae, purpura, or bullae.  ____________________________________________    LABS (pertinent positives/negatives) (all labs ordered are listed, but only abnormal results are displayed) Labs Reviewed  APTT - Abnormal; Notable for the following components:      Result Value   aPTT 38 (*)    All other components within normal limits  CBC - Abnormal; Notable for the following components:   RBC 3.70 (*)    Hemoglobin 12.1 (*)    HCT 35.7 (*)    All other components within normal limits  COMPREHENSIVE METABOLIC PANEL - Abnormal; Notable for the following components:    Glucose, Bld 138 (*)    Creatinine, Ser 1.71 (*)    GFR calc non Af Amer 37 (*)    GFR calc Af Amer 43 (*)    All other components within normal limits  GLUCOSE, CAPILLARY - Abnormal; Notable for the following components:   Glucose-Capillary 111 (*)    All other components within normal limits  SARS CORONAVIRUS 2 (TAT 6-24 HRS)  PROTIME-INR  DIFFERENTIAL  CBG MONITORING, ED   ____________________________________________   EKG  Interpreted by me Normal sinus rhythm rate of 63, right axis, normal intervals.  Normal QRS ST segments and T waves.  ____________________________________________    RADIOLOGY  Ct Head Code Stroke Wo Contrast  Result Date: 11/19/2018 CLINICAL DATA:  Code stroke. Headache, chronic, neuro deficit, possible stroke. Additional history provided: Right-sided facial droop, slurred speech since 10 a.m. today . EXAM: CT HEAD WITHOUT CONTRAST TECHNIQUE: Contiguous axial images were obtained from the base of the skull through the vertex without intravenous contrast. COMPARISON:  Head CT 10/22/2015 FINDINGS: Brain: There is no acute intracranial hemorrhage or demarcated cortical infarction. No evidence of intracranial mass. No midline shift or extra-axial fluid collection. Chronic bilateral basal ganglia lacunar infarcts unchanged from prior head CT 10/21/2015. Ill-defined hypoattenuation of the cerebral white matter consistent with chronic small vessel ischemic disease. Mild-to-moderate generalized parenchymal atrophy. Vascular: No hyperdense vessel. Atherosclerotic calcification of the carotid artery siphons and vertebrobasilar system. Skull: No calvarial fracture or suspicious osseous lesion Sinuses/Orbits: Visualized orbits demonstrate no acute abnormality. No significant paranasal sinus disease or mastoid effusion at the imaged levels. ASPECTS ([place] Stroke Program Early CT Score) - Ganglionic level infarction (caudate, lentiform nuclei, internal capsule, insula, M1-M3  cortex): 7 - Supraganglionic infarction (M4-M6 cortex): 3 Total score (0-10 with 10 being normal): 10 (when discounting chronic infarcts) These results were called by telephone at the time of interpretation on 11/19/2018 at 5:05 pm to provider Isaac Stevens , who verbally acknowledged these results. IMPRESSION: 1. No acute intracranial hemorrhage or demarcated cortical infarction. 2. Generalized atrophy with chronic small vessel ischemic disease. 3. Chronic bilateral basal ganglia lacunar infarcts unchanged from prior head CT 10/21/2015. Electronically Signed   By: Kellie Simmering   On: 11/19/2018 17:09    ____________________________________________   PROCEDURES Procedures  ____________________________________________  DIFFERENTIAL DIAGNOSIS   Intracranial hemorrhage, acute ischemic stroke  CLINICAL IMPRESSION / ASSESSMENT AND PLAN / ED COURSE  Medications ordered in the ED: Medications  aspirin chewable tablet 324 mg (has no administration in time range)    Pertinent labs & imaging results that were available during my care of the patient were reviewed by me and considered in my medical decision making (see chart for details).  Isaac Stevens was evaluated in Emergency Department on 11/19/2018 for the symptoms described in the history of present illness. He was evaluated in the context of the global COVID-19 pandemic, which necessitated consideration that the patient might be at risk for infection with the SARS-CoV-2 virus that causes COVID-19. Institutional protocols and algorithms that pertain to the evaluation of patients at risk for COVID-19 are in a state of rapid change based on information released by regulatory bodies including the CDC and federal and state organizations. These policies and algorithms were followed during the patient's care in the ED.   Patient presents with right-sided weakness and slurred speech and facial droop in the setting of gait unsteadiness for the past  3 days, concerning for acute ischemic stroke.  Stat CT head obtained, will follow up read and neurology consultation.  Patient is maintaining airway and not in distress.  Clinical Course as of Nov 18 1736  Tue Nov 19, 2018  1710 CT head d/w radiologist - negative for ich or acute infarct on CT. Neurologist on screen now for evaluation.    [PS]    Clinical Course User Index [PS] Carrie Mew, MD     ----------------------------------------- 5:38 PM on 11/19/2018 -----------------------------------------  Evaluation discussed with neurologist who finds that the patient's symptoms ultimately have started about 3 days ago and he is not a candidate for any acute intervention such as endovascular retrieval or TPA. Case d/w hospitalist for further eval.  ____________________________________________   FINAL CLINICAL IMPRESSION(S) /  ED DIAGNOSES    Final diagnoses:  Acute ischemic stroke [facility])     ED Discharge Orders    None      Portions of this note were generated with dragon dictation software. Dictation errors may occur despite best attempts at proofreading.   Carrie Mew, MD 11/19/18 1739

## 2018-11-19 NOTE — ED Notes (Signed)
PT to CT.

## 2018-11-19 NOTE — Progress Notes (Signed)
CODE STROKE- PHARMACY COMMUNICATION   Time CODE STROKE called/page received: [id number]  Time response to CODE STROKE was made (in person or via phone): 1705  Time Stroke Kit retrieved from [place] (only if needed): NA, pt LKW reportedly 1000 outside of tPA window, RN to call if anything changes and tPA is warranted  Name of Provider/Nurse contacted: Jana Half, [place] ,PharmD Clinical Pharmacist  11/19/2018  5:05 PM

## 2018-11-19 NOTE — Progress Notes (Signed)
During STEMI evaluation, chaplain provided support to family (wife, daughter, son). "Isaac Stevens" is patients preferred name. Family is very close to "Isaac Stevens" and were involved with him all day as he showed signs of needing medical assistance due to changes in speech and mobility. Family appreciates prayer and communication. Wife anticipates returning on Wednesday to be with Isaac Stevens. (Wife has health challenges including current treatment for cancer for which Isaac Stevens has been providing her transportation to treatments at [facility]). "Isaac Stevens" named his distress as he is aware of how quickly things change and fears of what will happen to him and his wife now since he has been her primary caregiver support.     11/19/18 1900  Clinical Encounter Type  Visited With Patient;Family  Visit Type Initial;ED  Referral From Nurse  Spiritual Encounters  Spiritual Needs Prayer;Emotional  Stress Factors  Patient Stress Factors Health changes  Family Stress Factors Loss

## 2018-11-19 NOTE — ED Notes (Signed)
Code  Stroke  Called  To [phone] 

## 2018-11-19 NOTE — Progress Notes (Signed)
Patient ID: Isaac Stevens, male   DOB: 1935/03/25, 83 y.o.   MRN: [id number]  ACP note  Patient, wife and son at the bedside  Patient coming in with dizziness starting from yesterday around 12 noon.  Today around 10 AM was dizzy and stumbling around and right arm and right leg was incoordinated and weak and he also had some slurred speech.  Things got worse and he went to see his PCP who sent him into the ER.  He was seen by telemetry neurology and they recommended stroke work-up and starting an aspirin.  His strength had improved by the time I saw him he was just little uncoordinated with finger-nose testing.  CODE STATUS discussed and patient wishes to be a full code.  Plan.  Right-sided incoordination and slurred speech.  This is concerning for cerebellar stroke.  MRI of the brain, carotid ultrasound and echocardiogram.  Start aspirin and Lipitor.  PT OT speech and neurology consultations.  Allow permissive hypertension.  Time spent on ACP discussion 17 minutes Dr Loletha Grayer

## 2018-11-19 NOTE — ED Notes (Signed)
Patient transported to MRI 

## 2018-11-19 NOTE — Consult Note (Signed)
[facility] TeleNeurology Consult Services   Date of Service:   11/19/2018 16:58:23  Impression:     .  Rule Out Acute Ischemic Stroke  Comments/Sign-Out: 83 YO M with h/o DM recent syncope vs seizure episode who presented with dizziness, off balance, slurred speech and right sided weakness. R/O stroke  Metrics: Last Known Well: 11/19/2018 10:00:00 [facility] Notification Time: 11/19/2018 16:58:23 Arrival Time: 11/19/2018 16:34:00 Stamp Time: 11/19/2018 16:58:23 Time First Login Attempt: 11/19/2018 17:06:28 Video Start Time: 11/19/2018 17:06:28  Symptoms: off balance, slurred speech, right sided weakness. NIHSS Start Assessment Time: 11/19/2018 17:11:16 Patient is not a candidate for Alteplase/Activase. Patient was not deemed candidate for Alteplase/Activase thrombolytics because of Last Well Known Above 4.5 Hours. Video End Time: 11/19/2018 17:19:23  CT head was reviewed and results were: IMPRESSION: 1. No acute intracranial hemorrhage or demarcated cortical infarction. 2. Generalized atrophy with chronic small vessel ischemic disease. 3. Chronic bilateral basal ganglia lacunar infarcts unchanged from prior head CT 10/21/2015.  Clinical Presentation is not Suggestive of Large Vessel Occlusive Disease  ED Physician notified of diagnostic impression and management plan on 11/19/2018 17:20:28  Our recommendations are outlined below.  Recommendations:     .  Activate Stroke Protocol Admission/Order Set     .  Stroke/Telemetry Floor     .  Neuro Checks     .  Bedside Swallow Eval     .  DVT Prophylaxis     .  IV Fluids, Normal Saline     .  Head of Bed 30 Degrees     .  Euglycemia and Avoid Hyperthermia (PRN Acetaminophen)     .  Initiate Aspirin 81 MG Daily  Routine Consultation with [facility] for Follow up Care  Sign Out:     .  Discussed with Emergency Department Provider     ------------------------------------------------------------------------------  History of Present Illness: Patient is a 83 year old Male.  Patient was brought by private transportation with symptoms of off balance, slurred speech, right sided weakness.  83 YO M with h/o DM recent syncope vs seizure episode who presented with dizziness, off balance, slurred speech and right sided weakness. Apparently he was having dizziness and was off balanced for past couple of days and was seen earlier at a doctor appointment in [facility]. States that around 10:00 he started having right sided weakness and slurred speech.    Past Medical History:     . Hypertension     . Diabetes Mellitus  Examination: BP(127/65), Pulse(63), Blood Glucose(111) 1A: Level of Consciousness - Alert; keenly responsive + 0 1B: Ask Month and Age - Both Questions Right + 0 1C: Blink Eyes & Squeeze Hands - Performs Both Tasks + 0 2: Test Horizontal Extraocular Movements - Normal + 0 3: Test Visual Fields - No Visual Loss + 0 4: Test Facial Palsy (Use [place]imace if Obtunded) - Minor paralysis (flat nasolabial fold, smile asymmetry) + 1 5A: Test Left Arm Motor Drift - No Drift for 10 Seconds + 0 5B: Test Right Arm Motor Drift - No Drift for 10 Seconds + 0 6A: Test Left Leg Motor Drift - No Drift for 5 Seconds + 0 6B: Test Right Leg Motor Drift - No Drift for 5 Seconds + 0 7: Test Limb Ataxia (FNF/Heel-Shin) - No Ataxia + 0 8: Test Sensation - Normal; No sensory loss + 0 9: Test Language/Aphasia - Normal; No aphasia + 0 10: Test Dysarthria - Mild-Moderate Dysarthria: Slurring but can be understood +  1 11: Test Extinction/Inattention - No abnormality + 0  NIHSS Score: 2  Patient/Family was informed the Neurology Consult would happen via TeleHealth consult by way of interactive audio and video telecommunications and consented to receiving care in this manner.   Due to the immediate potential for life-threatening  deterioration due to underlying acute neurologic illness, I spent 22 minutes providing critical care. This time includes time for face to face visit via telemedicine, review of medical records, imaging studies and discussion of findings with providers, the patient and/or family.   Dr Beau Fanny   [facility] [phone]  Case DH:[id number]

## 2018-11-20 ENCOUNTER — Other Ambulatory Visit: Payer: Self-pay

## 2018-11-20 ENCOUNTER — Inpatient Hospital Stay: Payer: PPO

## 2018-11-20 ENCOUNTER — Inpatient Hospital Stay
Admit: 2018-11-20 | Discharge: 2018-11-20 | Disposition: A | Discharge status: Home / Self Care | Payer: PPO | Attending: Internal Medicine | Admitting: Internal Medicine

## 2018-11-20 DIAGNOSIS — R42 Dizziness and giddiness: Secondary | ICD-10-CM

## 2018-11-20 LAB — LIPID PANEL
Cholesterol: 159 mg/dL (ref 0–200)
HDL: 41 mg/dL (ref >40)
LDL Cholesterol: 90 mg/dL (ref 0–99)
Total CHOL/HDL Ratio: 3.9 RATIO
Triglycerides: 141 mg/dL (ref <150)
VLDL: 28 mg/dL (ref 0–40)

## 2018-11-20 LAB — GLUCOSE, CAPILLARY
Glucose-Capillary: 81 mg/dL (ref 70–99)
Glucose-Capillary: 146 mg/dL — ABNORMAL HIGH (ref 70–99)
Glucose-Capillary: 108 mg/dL — ABNORMAL HIGH (ref 70–99)

## 2018-11-20 LAB — SARS CORONAVIRUS 2 (TAT 6-24 HRS): SARS Coronavirus 2: NEGATIVE

## 2018-11-20 MED ORDER — ENOXAPARIN SODIUM 40 MG/0.4ML ~~LOC~~ SOLN: 40.0000 mg | SUBCUTANEOUS | Status: DC

## 2018-11-20 MED ORDER — ASPIRIN EC 81 MG PO TBEC: 81.0000 mg | DELAYED_RELEASE_TABLET | Freq: Every day | ORAL | Status: DC

## 2018-11-20 MED ORDER — ATORVASTATIN CALCIUM 40 MG PO TABS: 40.0000 mg | ORAL_TABLET | Freq: Every day | ORAL | 0 refills | Status: DC

## 2018-11-20 NOTE — Evaluation (Signed)
Occupational Therapy Evaluation Patient Details Name: Isaac Stevens MRN: [id number] DOB: Feb 14, 1935 Today's Date: 11/20/2018    History of Present Illness Pt is 83 yo male that presented to the ED with slurred speech and RUE and RLE numbness/weakness, abnormal gait, MRI negative for acute findings. PMH of CKD, DM, HLD, HTN, RA.   Clinical Impression   Isaac Stevens was seen for OT evaluation this date. Pt received semi-supine in bed with wife at bedside. Wife reports he seems better this date, but not back to his usual self. Prior to hospital admission, pt was active and independent in all aspects of ADL/IADL.  Pt lives with his spouse in a 1 level home with a basement and 1 step to enter.  Currently pt demonstrates impairments in cognition, RUE functional use with deficits in [facility] and [place], and safety awreness requiring min assist for ADL and supervision for functional mobility.  Pt would benefit from skilled OT to address noted impairments and functional limitations (see below for any additional details) in order to maximize safety and independence while minimizing falls risk and caregiver burden. Upon hospital discharge, recommend outpatient OT to maximize return to PLOF and address [facility] and cognitive challenges during occupational tasks.     Follow Up Recommendations  Outpatient OT    Equipment Recommendations  None recommended by OT    Recommendations for Other Services       Precautions / Restrictions Precautions Precautions: Fall Restrictions Weight Bearing Restrictions: No      Mobility Bed Mobility Overal bed mobility: Modified Independent                Transfers Overall transfer level: Needs assistance Equipment used: None Transfers: Sit to/from Stand Sit to Stand: Supervision         General transfer comment: Pt prefers unilateral UE support    Balance Overall balance assessment: Needs assistance Sitting-balance support: Feet supported Sitting  balance-Leahy Scale: Good Sitting balance - Comments: Steady sitting, reaching within BOS                                   ADL either performed or assessed with clinical judgement   ADL Overall ADL's : Needs assistance/impaired                                       General ADL Comments: Pt demonstrates decreased cognition during functional tasks, and requires increased time/effort to complete basic movements. Decreased [place] limits ability to complete ADLs requiring fine movements such as dressing, self-feeding, and grooming. Caregiver at bedside states this is far from pt baseline and that he is typically very active and independent.     Vision Baseline Vision/History: No visual deficits(hx of bilat cataract sx.) Patient Visual Report: No change from baseline Vision Assessment?: No apparent visual deficits     Perception     Praxis      Pertinent Vitals/Pain Pain Assessment: No/denies pain     Hand Dominance Right   Extremity/Trunk Assessment Upper Extremity Assessment Upper Extremity Assessment: RUE deficits/detail RUE Deficits / Details: grossly 4/5, except R grip strength, 3/5, some difficulty with RUE gross motor coordination and decreased [place] during opposition and finger to nose. Pt states he has hx of osteoarthritis in MCPs 1-3 which also limits RUE use. RUE dominant. RUE Sensation: WNL RUE Coordination: decreased fine  motor;decreased gross motor LUE Deficits / Details: grossly 4/5 LUE Sensation: WNL LUE Coordination: WNL   Lower Extremity Assessment Lower Extremity Assessment: Defer to PT evaluation;Overall [facility] for tasks assessed   Cervical / Trunk Assessment Cervical / Trunk Assessment: Normal   Communication Communication Communication: Expressive difficulties;HOH(Speech slurred, at times difficult to understand.)   Cognition Arousal/Alertness: Awake/alert Behavior During Therapy: WFL for tasks assessed/performed Overall  Cognitive Status: Impaired/Different from baseline Area of Impairment: Attention;Following commands;Problem solving;Safety/judgement                       Following Commands: Follows one step commands consistently;Follows multi-step commands inconsistently Safety/Judgement: Decreased awareness of deficits   Problem Solving: Difficulty sequencing;Slow processing     General Comments  Pt noted to have R facial droop with caregiver confirming facial asymetry. Continues to exhibit R sided weakness and decreased coordination.    Exercises Other Exercises Other Exercises: Pt and caregiver educated in falls prevention strategies, safe use of AE/DME for ADL mgt, and compensatory cognitive strategies to support safety and functional independence during meaningful occupations of daily life.   Shoulder Instructions      Home Living Family/patient expects to be discharged to:: Private residence Living Arrangements: Spouse/significant other Available Help at Discharge: Family;Available 24 hours/day Type of Home: House Home Access: Stairs to enter [employer] of Steps: 1 Entrance Stairs-Rails: Left Home Layout: Two level;Able to live on main level with bedroom/bathroom Alternate Level Stairs-Number of Steps: basement, does not often go down   Bathroom Shower/Tub: Occupational psychologist: Standard     Home Equipment: Cane - quad;Bedside commode;Walker - 2 wheels   Additional Comments: Per pt wife, pt has QC, BSC, and 2WW.      Prior Functioning/Environment Level of Independence: Independent        Comments: Per pt wife, pt stays active and busy. He works in his woodshop and on cars. He is independent for all mobility, ADL, and IADL at baseline.        OT Problem List: Decreased strength;Pain;Decreased range of motion;Decreased activity tolerance;Decreased safety awareness;Impaired balance (sitting and/or standing);Decreased knowledge of use of DME or  AE;Impaired UE functional use      OT Treatment/Interventions: Self-care/ADL training;Therapeutic exercise;Neuromuscular education;Balance training;DME and/or AE instruction;Patient/family education;Cognitive remediation/compensation    OT Goals(Current goals can be found in the care plan section) Acute Rehab OT Goals Patient Stated Goal: to walk better OT Goal Formulation: With patient Time For Goal Achievement: 12/04/18 Potential to Achieve Goals: Good ADL Goals Pt Will Perform Eating: with modified independence;with adaptive utensils;sitting(With LRAE PRN for improved safety and functional independence) Pt Will Perform Grooming: with modified independence;sitting;with adaptive equipment(With LRAD PRN for improved safety and functional independence)  OT Frequency: Min 1X/week   Barriers to D/C:            Co-evaluation              AM-PAC OT "6 Clicks" Daily Activity     Outcome Measure Help from another person eating meals?: A Little Help from another person taking care of personal grooming?: A Little Help from another person toileting, which includes using toliet, bedpan, or urinal?: A Little Help from another person bathing (including washing, rinsing, drying)?: A Little Help from another person to put on and taking off regular upper body clothing?: A Little Help from another person to put on and taking off regular lower body clothing?: A Little 6 Click Score: 18   End of  Session    Activity Tolerance: Patient tolerated treatment well Patient left: in bed;with call bell/phone within reach;with bed alarm set  OT Visit Diagnosis: Other abnormalities of gait and mobility (R26.89)                Time: [id number] OT Time Calculation (min): 26 min Charges:  OT General Charges $OT Visit: 1 Visit OT Evaluation $OT Eval Moderate Complexity: 1 Mod OT Treatments $Self Care/Home Management : 8-22 mins  Shara Blazing, M.S., OTR/L Ascom: [phone] 11/20/18, 4:10  PM

## 2018-11-20 NOTE — Progress Notes (Signed)
Patient said he would get at his doctors ofc

## 2018-11-20 NOTE — Evaluation (Signed)
Physical Therapy Evaluation Patient Details Name: HIEU RIEKEN MRN: [id number] DOB: [birth year] Today's Date: 11/20/2018   History of Present Illness  Pt is 83 yo male that presented to the ED with slurred speech and RUE and RLE numbness/weakness, abnormal gait, MRI negative for acute findings. PMH of CKD, DM, HLD, HTN, RA.     Clinical Impression  Pt A&Ox4, no complaints of pain, reported overall he is feeling better, stated he does still have some R hand numbness/weakness that he can tell. Pt stated he lives with his wife, previously independent at baseline, drives, no falls in the last 6 months.  The patient demonstrated bed mobility and transfers mod I (at least unilateral UE support noted). Pt demonstrated no facial asymmetries, reported no changes in vision (though some difficulty noted with visual tracking to the L), UE and LE strength grossly symmetrical& strong (pt does exhibit significant weakness in RUE grip). Light touch sensation intact. The pt did present with deficits in RUE coordination, no gross abnormalities noted for RLE with seated testing. Pt exhibited ataxic gait staggering and gait path deviations noted. Pt able to correct most LOB with several stumbling steps. Several AD trialled, pt most safe with quad cane and extensive cueing to slow activity pacing/gait velocity to decreased instability, pt somewhat agreeable.  Overall the patient demonstrated deficits (see "PT Problem List") that impede the patient's functional abilities, safety, and mobility and would benefit from skilled PT intervention. Recommendation is outpatient PT for further balance/gait training to decrease risk of falls.     Follow Up Recommendations Outpatient PT    Equipment Recommendations  Other (comment)(quad cane)    Recommendations for Other Services OT consult     Precautions / Restrictions Precautions Precautions: Fall Restrictions Weight Bearing Restrictions: No      Mobility  Bed  Mobility Overal bed mobility: Modified Independent                Transfers Overall transfer level: Needs assistance Equipment used: None Transfers: Sit to/from Stand Sit to Stand: Supervision         General transfer comment: Pt prefers unilateral UE support  Ambulation/Gait Ambulation/Gait assistance: Min guard Gait Distance (Feet): 200 Feet Assistive device: IV Pole;Quad cane Gait Pattern/deviations: Staggering left;Staggering right     General Gait Details: Pt with staggering gait; mildly ataxic, improved balance noted with at least unilateral support and cues to decreased gait speed and attend to mobility  Stairs            Wheelchair Mobility    Modified Rankin (Stroke Patients Only)       Balance Overall balance assessment: Needs assistance Sitting-balance support: Feet supported Sitting balance-Leahy Scale: Good       Standing balance-Leahy Scale: Good Standing balance comment: pt able to ambulate without UE support but reaches for external support. improved safety noted with support. several stumbles noted                             Pertinent Vitals/Pain Pain Assessment: No/denies pain    Home Living Family/patient expects to be discharged to:: Private residence Living Arrangements: Spouse/significant other Available Help at Discharge: Family Type of Home: House Home Access: Stairs to enter Entrance Stairs-Rails: Left Entrance Stairs-Number of Steps: a couple Home Layout: Two level;Able to live on main level with bedroom/bathroom   Additional Comments: Pt unsure of current DME at home. Stated he may be able to borrow a cane from  his son    Prior Function Level of Independence: Independent         Comments: drives     Hand Dominance   Dominant Hand: Right    Extremity/Trunk Assessment   Upper Extremity Assessment Upper Extremity Assessment: Defer to OT evaluation;RUE deficits/detail;LUE deficits/detail RUE  Deficits / Details: grossly 4/5, except R grip strength, 3/5, some difficulty with RUE gross motor coordination RUE Sensation: WNL RUE Coordination: decreased gross motor LUE Deficits / Details: grossly 4/5 LUE Sensation: WNL LUE Coordination: WNL    Lower Extremity Assessment Lower Extremity Assessment: RLE deficits/detail;LLE deficits/detail RLE Deficits / Details: grossly 4/5, except hip flexion 4-/5 RLE Sensation: WNL RLE Coordination: WNL LLE Deficits / Details: grossly 4/5 LLE Sensation: WNL LLE Coordination: WNL    Cervical / Trunk Assessment Cervical / Trunk Assessment: Normal  Communication   Communication: No difficulties  Cognition Arousal/Alertness: Awake/alert Behavior During Therapy: WFL for tasks assessed/performed Overall Cognitive Status: Within Functional Limits for tasks assessed                                        General Comments      Exercises Other Exercises Other Exercises: Trialled no AD, iv pole and quad cane. Pt somewhat amendable to AD with ambulation   Assessment/Plan    PT Assessment Patient needs continued PT services  PT Problem List Decreased mobility;Decreased safety awareness;Decreased balance;Decreased coordination       PT Treatment Interventions DME instruction;Therapeutic exercise;Gait training;Balance training;Stair training;Neuromuscular re-education;Therapeutic activities;Patient/family education;Manual techniques    PT Goals (Current goals can be found in the Care Plan section)  Acute Rehab PT Goals Patient Stated Goal: to walk better PT Goal Formulation: With patient Time For Goal Achievement: 12/04/18 Potential to Achieve Goals: Good    Frequency Min 2X/week   Barriers to discharge        Co-evaluation               AM-PAC PT "6 Clicks" Mobility  Outcome Measure Help needed turning from your back to your side while in a flat bed without using bedrails?: None Help needed moving from  lying on your back to sitting on the side of a flat bed without using bedrails?: None Help needed moving to and from a bed to a chair (including a wheelchair)?: None Help needed standing up from a chair using your arms (e.g., wheelchair or bedside chair)?: None Help needed to walk in hospital room?: A Little Help needed climbing 3-5 steps with a railing? : A Little 6 Click Score: 22    End of Session Equipment Utilized During Treatment: Gait belt Activity Tolerance: Patient tolerated treatment well Patient left: in bed;Other (comment)(with transport at bedside) Nurse Communication: Mobility status PT Visit Diagnosis: Unsteadiness on feet (R26.81);Other abnormalities of gait and mobility (R26.89)    Time: [id number] PT Time Calculation (min) (ACUTE ONLY): 28 min   Charges:   PT Evaluation $PT Eval Low Complexity: 1 Low PT Treatments $Gait Training: 23-37 mins        Lieutenant Diego PT, DPT 11:45 AM,11/20/18 [phone]

## 2018-11-20 NOTE — Discharge Instructions (Signed)
Diabetic heart healthy diet  Activity as tolerated

## 2018-11-20 NOTE — Evaluation (Signed)
Speech Language Pathology Evaluation Patient Details Name: CHARLEMAGNE HOUTZ MRN: [id number] DOB: [birth year] Today's Date: 11/20/2018 Time: 1300-1400 SLP Time Calculation (min) (ACUTE ONLY): 60 min  Problem List:  Patient Active Problem List   Diagnosis Date Noted  . Stroke ([place]) 11/19/2018  . Syncope 10/21/2015  . Drug-induced pneumonitis 04/02/2015  . COPD (chronic obstructive pulmonary disease) ([place]) 04/02/2015  . Dyspnea 04/02/2015  . COPD exacerbation ([place]) 03/11/2015  . CAFL (chronic airflow limitation) ([place]) 08/14/2014  . HLD (hyperlipidemia) 08/14/2014  . BP (high blood pressure) 08/14/2014  . Diabetes mellitus type 2, uncontrolled ([place]) 08/14/2014  . Chronic kidney disease (CKD), stage III (moderate) 01/27/2014  . 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 CAFL (chronic airflow limitation) ([facility])   . CKD (chronic kidney disease)   . Diabetes mellitus without complication ([place])   . Hyperlipidemia   . Hypertension   . Rheumatoid arthritis [facility])    Past Surgical History:  Past Surgical History:  Procedure Laterality Date  . TOE SURGERY     right foot   HPI:  Pt is a 83 y.o. male with a known history of CKD stage III, DM w/out use of insulin control per report, hypertension, CAFL, sensorineural heaing loss bilat.(has aids), and hyperlipidemia and diagnosis of arthritis, gout.  Pt's Wife stated he had "a Seizure about 2 years ago" w/ R sided weakness then including in the mouth. (That's what they were told by MDs).  Pt was brought to the ED d/t feeling off balance and unsteady gait.His right arm and right leg were having difficulty moving the way he wanted to; also had some slurred speech. Symptoms improved now. MRI no acute abnormalities. Pt and Wife deny pt having any difficulty swallowing; currently on a regular diet.    Assessment / Plan / Recommendation Clinical Impression  Pt appears to present  w/ Motor Speech deficits c/b Dysarthria which impacts speech intellgibility at word-conversation levels. When pt utilizes strategies including Slowing Rate, Increaseding Volume, and Overarticulation(bigger mouth), he increased intelligibility significantly at word-sentence level. Pt was aware of the Dysarthria and attempted to correct the reduced intelligibility of his speech. Pt was also educated on pausing while slowing his rate and replenishing his breath support -- cut longer sentences into shorter sentences. During Auditory Comprehension and Cognitive-linguistic tasks and questions, no gross deficits notes were noted during this bedside assessment. Reading at lengthy sentence level was [facility]. Pt conversed w/ appropriate thought and content about issues surrounding recent family events supported by his Wife. A/O x4. Writing tasks were not completed during this session.  Discussed w/ pt strategies (and gave handouts on such) to improve Articulation of speech; Intelligibility of speech. Encouraged him to practice suggestions/strategies on handouts including Articulation strategies as practiced together, and the Labial OMEs. Pt stated he wanted "to wait" to schedule for f/u skilled ST services post Discharge "because this has gotten better since yesterday". He also stated "I'm not a big talker at home". Pt would benefit from skilled ST services at discharge in order to improve speech-communication in conversation w/ others as well as during ADLs if continued deficits are noted by pt/Wife.  OF NOTE:  Wife stated pt's speech intelligibility was reduced at baseline - he tends to talk w/ a closed mouth posture. She also felt he may have had R facial weakness post "a Seizure about 2 years ago but we don't know". He does chew/dip and holds in orally in  a cheek. Both pt and Wife have Hearing Loss.     SLP Assessment  SLP Recommendation/Assessment: All further Speech Lanaguage Pathology  needs can be addressed in the next  venue of care(pt will choose in the next few days) SLP Visit Diagnosis: Dysarthria and anarthria (R47.1)    Follow Up Recommendations  Outpatient SLP(if pt chooses to do so)    Frequency and Duration  TBD if outpatient f/u        SLP Evaluation Cognition  Overall Cognitive Status: Within Functional Limits for tasks assessed Arousal/Alertness: Awake/alert Orientation Level: Oriented X4 Attention: Focused;Sustained Focused Attention: Appears intact Sustained Attention: Appears intact Memory: Appears intact Awareness: Appears intact Problem Solving: Appears intact Executive Function: Reasoning Reasoning: Appears intact(talked about need to reduce his stress at home) Behaviors: (none) Safety/Judgment: Appears intact [employer] Scales of Cognitive Functioning: (n/a)       Comprehension  Auditory Comprehension Overall Auditory Comprehension: Appears within functional limits for tasks assessed Yes/No Questions: Within Functional Limits Commands: Within Functional Limits Conversation: Simple Other Conversation Comments: Dysarthria noted; motor speech Interfering Components: Motor planning EffectiveTechniques: Increased volume;Slowed speech;Stressing words Visual Recognition/Discrimination Discrimination: Not tested Reading Comprehension Reading Status: Within funtional limits(at full sentence level)    Expression Expression Primary Mode of Expression: Verbal Verbal Expression Overall Verbal Expression: Appears within functional limits for tasks assessed Initiation: No impairment Automatic Speech: Name;Social Response;Counting;Day of week;Month of year Level of Generative/Spontaneous Verbalization: Sentence Repetition: No impairment Naming: No impairment Pragmatics: No impairment Interfering Components: Speech intelligibility Non-Verbal Means of Communication: Not applicable Written Expression Dominant Hand: Right Written Expression: Not tested   Oral / Motor   Oral Motor/Sensory Function Overall Oral Motor/Sensory Function: Mild impairment Facial ROM: Reduced right Facial Symmetry: Abnormal symmetry right Facial Strength: Reduced right Facial Sensation: Within Functional Limits Lingual ROM: Within Functional Limits Lingual Symmetry: Within Functional Limits Lingual Strength: Within Functional Limits Velum: Within Functional Limits Mandible: Within Functional Limits Motor Speech Overall Motor Speech: Impaired Respiration: Within functional limits Phonation: Normal(soft voice -- quiet. "I don't talk loud at home") Resonance: Within functional limits Articulation: Impaired Level of Impairment: Word Intelligibility: Intelligibility reduced Word: 50-74% accurate(improved w/ strategies) Phrase: 50-74% accurate Sentence: 50-74% accurate Conversation: 50-74% accurate(improved w/ strategies) Motor Planning: Impaired Motor Speech Errors: Aware Interfering Components: Hearing loss Effective Techniques: Slow rate;Increased vocal intensity;Over-articulate   GO                     Orinda Kenner, MS, CCC-SLP Hasnain Manheim 11/20/2018, 5:13 PM

## 2018-11-20 NOTE — Progress Notes (Signed)
*  PRELIMINARY RESULTS* Echocardiogram 2D Echocardiogram has been performed.  [name] 11/20/2018, 11:59 AM

## 2018-11-20 NOTE — TOC Initial Note (Signed)
Transition of Care [facility]) - Initial/Assessment Note    Patient Details  Name: Isaac Stevens MRN: [id number] Date of Birth: 05-26-1935  Transition of Care [facility]) CM/SW Contact:    Shelbie Hutching, RN Phone Number: 11/20/2018, 4:13 PM  Clinical Narrative:                 Patient admitted for stroke.  Patient is from home with wife and is independent at baseline.  Patient has a quad cane and a walker at home.  PT has recommended outpatient PT and patient agrees to referral.  Referral faxed to [facility] OP rehab.  Wife is at the bedside and will provide transportation home.  Patient should discharge home today once all test results come back.    Expected Discharge Plan: OP Rehab Barriers to Discharge: No Barriers Identified   Patient Goals and CMS Choice Patient states their goals for this hospitalization and ongoing recovery are:: Patient ready to go home      Expected Discharge Plan and Services Expected Discharge Plan: OP Rehab       Living arrangements for the past 2 months: Single Family Home                                      Prior Living Arrangements/Services Living arrangements for the past 2 months: Single Family Home Lives with:: Spouse Patient language and need for interpreter reviewed:: No Do you feel safe going back to the place where you live?: Yes      Need for Family Participation in Patient Care: Yes (Comment)(stroke) Care giver support system in place?: Yes (comment)(wife)   Criminal Activity/Legal Involvement Pertinent to Current Situation/Hospitalization: No - Comment as needed  Activities of Daily Living Home Assistive Devices/Equipment: None ADL Screening (condition at time of admission) Patient's cognitive ability adequate to safely complete daily activities?: Yes Is the patient deaf or have difficulty hearing?: No Does the patient have difficulty seeing, even when wearing glasses/contacts?: No Does the patient have difficulty concentrating,  remembering, or making decisions?: No Patient able to express need for assistance with ADLs?: Yes Does the patient have difficulty dressing or bathing?: No Independently performs ADLs?: Yes (appropriate for developmental age) Does the patient have difficulty walking or climbing stairs?: No Weakness of Legs: None Weakness of Arms/Hands: None  Permission Sought/Granted Permission sought to share information with : Family Supports, Case Manager Permission granted to share information with : Yes, Verbal Permission Granted        Permission granted to share info w Relationship: wife     Emotional Assessment Appearance:: Appears stated age Attitude/Demeanor/Rapport: Engaged Affect (typically observed): Accepting Orientation: : Oriented to Self, Oriented to Place, Oriented to  Time, Oriented to Situation Alcohol / Substance Use: Not Applicable Psych Involvement: No (comment)  Admission diagnosis:  Stroke [facility]) [I63.9] Acute ischemic stroke [facility]) [I63.9] Patient Active Problem List   Diagnosis Date Noted  . Stroke ([place]) 11/19/2018  . Syncope 10/21/2015  . Drug-induced pneumonitis 04/02/2015  . COPD (chronic obstructive pulmonary disease) ([place]) 04/02/2015  . Dyspnea 04/02/2015  . COPD exacerbation ([place]) 03/11/2015  . CAFL (chronic airflow limitation) ([place]) 08/14/2014  . HLD (hyperlipidemia) 08/14/2014  . BP (high blood pressure) 08/14/2014  . Diabetes mellitus type 2, uncontrolled ([place]) 08/14/2014  . Chronic kidney disease (CKD), stage III (moderate) 01/27/2014  . 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 [place] [address] Phone: [phone] Fax: [phone]     Social Determinants of Health (SDOH) Interventions    Readmission Risk Interventions No flowsheet data found.

## 2018-11-20 NOTE — Consult Note (Signed)
Reason for Consult: unsteady gait Referring Physician: Dr. Leslye Peer  CC: unsteady gain   HPI: Isaac Stevens is an 83 y.o. male  Admitted with feeling off balance and unsteady gait.   His right arm and right leg were having difficulty moving the way he wanted to.  He also had some slurred speech.  Symptoms improved.  MRI no acute abnormalities and his carotids don't have any stenosis/plaque  Past Medical History:  Diagnosis Date  . CAFL (chronic airflow limitation) (HCC)   . CKD (chronic kidney disease)   . Diabetes mellitus without complication ([place])   . Hyperlipidemia   . Hypertension   . Rheumatoid arthritis [facility])     Past Surgical History:  Procedure Laterality Date  . TOE SURGERY     right foot    Family History  Problem Relation Age of Onset  . Prostate cancer Father   . Parkinson's disease Mother     Social History:  reports that he has quit smoking. His smokeless tobacco use includes chew. He reports that he does not drink alcohol or use drugs.  Allergies  Allergen Reactions  . Methotrexate Derivatives Shortness Of Breath  . Lisinopril Swelling  . Pseudoephedrine Hcl Other (See Comments)    confusion  . Sitagliptin Rash    Medications: I have reviewed the patient's current medications.  ROS: History obtained from the patient  General ROS: negative for - chills, fatigue, fever, night sweats, weight gain or weight loss Psychological ROS: negative for - behavioral disorder, hallucinations, memory difficulties, mood swings or suicidal ideation Ophthalmic ROS: negative for - blurry vision, double vision, eye pain or loss of vision ENT ROS: negative for - epistaxis, nasal discharge, oral lesions, sore throat, tinnitus or vertigo Allergy and Immunology ROS: negative for - hives or itchy/watery eyes Hematological and Lymphatic ROS: negative for - bleeding problems, bruising or swollen lymph nodes Endocrine ROS: negative for - galactorrhea, hair pattern changes,  polydipsia/polyuria or temperature intolerance Respiratory ROS: negative for - cough, hemoptysis, shortness of breath or wheezing Cardiovascular ROS: negative for - chest pain, dyspnea on exertion, edema or irregular heartbeat Gastrointestinal ROS: negative for - abdominal pain, diarrhea, hematemesis, nausea/vomiting or stool incontinence Genito-Urinary ROS: negative for - dysuria, hematuria, incontinence or urinary frequency/urgency Musculoskeletal ROS: negative for - joint swelling or muscular weakness Neurological ROS: as noted in HPI Dermatological ROS: negative for rash and skin lesion changes  Physical Examination: Blood pressure 127/69, pulse 62, temperature 97.7 F (36.5 C), temperature source Oral, resp. rate 16, height 5\' 8"  (1.727 m), weight 99.1 kg, SpO2 97 %.    Neurological Examination   Mental Status: Alert, oriented, thought content appropriate.. Cranial Nerves: II: Discs flat bilaterally; Visual fields grossly normal, pupils equal, round, reactive to light and accommodation III,IV, VI: ptosis not present, extra-ocular motions intact bilaterally V,VII: smile symmetric, facial light touch sensation normal bilaterally VIII: hearing normal bilaterally IX,X: gag reflex present XI: bilateral shoulder shrug XII: midline tongue extension Motor: Mild drift RLE Tone and bulk:normal tone throughout; no atrophy noted Sensory: Pinprick and light touch intact throughout, bilaterally Deep Tendon Reflexes: 1+ and symmetric throughout Plantars: Right: downgoing   Left: downgoing Cerebellar: Not tested Gait: not tested      Laboratory Studies:   Basic Metabolic Panel: Recent Labs  Lab 11/19/18 1713  NA 139  K 4.4  CL 103  CO2 24  GLUCOSE 138*  BUN 17  CREATININE 1.71*  CALCIUM 9.1    Liver Function Tests: Recent Labs  Lab  11/19/18 1713  AST 16  ALT 12  ALKPHOS 55  BILITOT 0.6  PROT 7.0  ALBUMIN 3.8   No results for input(s): LIPASE, AMYLASE in the last  168 hours. No results for input(s): AMMONIA in the last 168 hours.  CBC: Recent Labs  Lab 11/19/18 1713  WBC 5.3  NEUTROABS 3.8  HGB 12.1*  HCT 35.7*  MCV 96.5  PLT 162    Cardiac Enzymes: No results for input(s): CKTOTAL, CKMB, CKMBINDEX, TROPONINI in the last 168 hours.  BNP: Invalid input(s): POCBNP  CBG: Recent Labs  Lab 11/19/18 1645 11/20/18 0807  GLUCAP 111* 81    Microbiology: Results for orders placed or performed during the hospital encounter of 11/19/18  SARS CORONAVIRUS 2 (TAT 6-24 HRS) Nasopharyngeal Nasopharyngeal Swab     Status: None   Collection Time: 11/19/18  5:28 PM   Specimen: Nasopharyngeal Swab  Result Value Ref Range Status   SARS Coronavirus 2 NEGATIVE NEGATIVE Final    Comment: (NOTE) SARS-CoV-2 target nucleic acids are NOT DETECTED. The SARS-CoV-2 RNA is generally detectable in upper and lower respiratory specimens during the acute phase of infection. Negative results do not preclude SARS-CoV-2 infection, do not rule out co-infections with other pathogens, and should not be used as the sole basis for treatment or other patient management decisions. Negative results must be combined with clinical observations, patient history, and epidemiological information. The expected result is Negative. Fact Sheet for Patients: SugarRoll.be Fact Sheet for Healthcare Providers: https://www.woods-mathews.com/ This test is not yet approved or cleared by the [place] FDA and  has been authorized for detection and/or diagnosis of SARS-CoV-2 by FDA under an Emergency Use Authorization (EUA). This EUA will remain  in effect (meaning this test can be used) for the duration of the COVID-19 declaration under Section 56 4(b)(1) of the Act, 21 U.S.C. section 360bbb-3(b)(1), unless the authorization is terminated or revoked sooner. Performed at [facility], [place] [address] [place], [place] [address]      Coagulation Studies: Recent Labs    11/19/18 1713  LABPROT 13.8  INR 1.1    Urinalysis: No results for input(s): COLORURINE, LABSPEC, PHURINE, GLUCOSEU, HGBUR, BILIRUBINUR, KETONESUR, PROTEINUR, UROBILINOGEN, NITRITE, LEUKOCYTESUR in the last 168 hours.  Invalid input(s): APPERANCEUR  Lipid Panel:     Component Value Date/Time   CHOL 159 11/20/2018 0446   TRIG 141 11/20/2018 0446   HDL 41 11/20/2018 0446   CHOLHDL 3.9 11/20/2018 0446   VLDL 28 11/20/2018 0446   LDLCALC 90 11/20/2018 0446    HgbA1C:  Lab Results  Component Value Date   HGBA1C 6.1 (H) 11/19/2018    Urine Drug Screen:  No results found for: LABOPIA, COCAINSCRNUR, LABBENZ, AMPHETMU, THCU, LABBARB  Alcohol Level: No results for input(s): ETH in the last 168 hours.  Other results: EKG: normal EKG, normal sinus rhythm, unchanged from previous tracings.  Imaging: Mr Brain Wo Contrast  Result Date: 11/19/2018 CLINICAL DATA:  Diabetes and hypertension. Ataxia. Right-sided weakness. EXAM: MRI HEAD WITHOUT CONTRAST TECHNIQUE: Multiplanar, multiecho pulse sequences of the brain and surrounding structures were obtained without intravenous contrast. COMPARISON:  Head CT same day FINDINGS: Brain: Diffusion imaging does not show any acute or subacute infarction. Brainstem shows mild small vessel change of the pons. No focal cerebellar finding. Cerebral hemispheres show moderate chronic small-vessel ischemic changes of the deep and subcortical white matter. Old lacunar infarction left basal ganglia. There are dilated perivascular spaces in the basal ganglia and thalami. No evidence of  mass, hemorrhage, hydrocephalus or extra-axial collection. Vascular: Major vessels at the base of the brain show flow. Skull and upper cervical spine: Negative Sinuses/Orbits: Clear/normal Other: None IMPRESSION: No acute finding. Moderate chronic small-vessel ischemic changes of the cerebral hemispheric white matter, often seen at this age.  Electronically Signed   By: Nelson Chimes M.D.   On: 11/19/2018 19:46   US Carotid Bilateral (at [facility] Only)  Result Date: 11/20/2018 CLINICAL DATA:  84 year old male with stroke-like symptoms EXAM: BILATERAL CAROTID DUPLEX ULTRASOUND TECHNIQUE: Pearline Cables scale imaging, color Doppler and duplex ultrasound were performed of bilateral carotid and vertebral arteries in the neck. COMPARISON:  CT scan of the head 11/19/2018 FINDINGS: Criteria: Quantification of carotid stenosis is based on velocity parameters that correlate the residual internal carotid diameter with NASCET-based stenosis levels, using the diameter of the distal internal carotid lumen as the denominator for stenosis measurement. The following velocity measurements were obtained: RIGHT ICA: 85/27 cm/sec CCA: [id number] cm/sec SYSTOLIC ICA/CCA RATIO:  1.3 ECA:  193 cm/sec LEFT ICA: 83/20 cm/sec CCA: [id number] cm/sec SYSTOLIC ICA/CCA RATIO:  1.2 ECA:  134 cm/sec RIGHT CAROTID ARTERY: Mild smooth heterogeneous atherosclerotic plaque. By peak systolic velocity criteria, the stenosis remains less than 50%. RIGHT VERTEBRAL ARTERY:  Patent with normal antegrade flow. LEFT CAROTID ARTERY: Mild focal heterogeneous atherosclerotic plaque in the proximal internal carotid artery. By peak systolic velocity criteria, the estimated stenosis remains less than 50%. LEFT VERTEBRAL ARTERY:  Patent with normal antegrade flow. IMPRESSION: 1. Mild (1-49%) stenosis proximal right internal carotid artery secondary to mild smooth heterogeneous atherosclerotic plaque. 2. Mild (1-49%) stenosis proximal left internal carotid artery secondary to mild heterogeneous atherosclerotic plaque. 3. The vertebral arteries are patent with normal antegrade flow. Signed, Criselda Peaches, MD, [facility] Electronically Signed   By: Jacqulynn Cadet M.D.   On: 11/20/2018 10:33   Ct Head Code Stroke Wo Contrast  Result Date:  11/19/2018 CLINICAL DATA:  Code stroke. Headache, chronic, neuro deficit, possible stroke. Additional history provided: Right-sided facial droop, slurred speech since 10 a.m. today . EXAM: CT HEAD WITHOUT CONTRAST TECHNIQUE: Contiguous axial images were obtained from the base of the skull through the vertex without intravenous contrast. COMPARISON:  Head CT 10/22/2015 FINDINGS: Brain: There is no acute intracranial hemorrhage or demarcated cortical infarction. No evidence of intracranial mass. No midline shift or extra-axial fluid collection. Chronic bilateral basal ganglia lacunar infarcts unchanged from prior head CT 10/21/2015. Ill-defined hypoattenuation of the cerebral white matter consistent with chronic small vessel ischemic disease. Mild-to-moderate generalized parenchymal atrophy. Vascular: No hyperdense vessel. Atherosclerotic calcification of the carotid artery siphons and vertebrobasilar system. Skull: No calvarial fracture or suspicious osseous lesion Sinuses/Orbits: Visualized orbits demonstrate no acute abnormality. No significant paranasal sinus disease or mastoid effusion at the imaged levels. ASPECTS ([place] Stroke Program Early CT Score) - Ganglionic level infarction (caudate, lentiform nuclei, internal capsule, insula, M1-M3 cortex): 7 - Supraganglionic infarction (M4-M6 cortex): 3 Total score (0-10 with 10 being normal): 10 (when discounting chronic infarcts) These results were called by telephone at the time of interpretation on 11/19/2018 at 5:05 pm to provider PHILLIP STAFFORD , who verbally acknowledged these results. IMPRESSION: 1. No acute intracranial hemorrhage or demarcated cortical infarction. 2. Generalized atrophy with chronic small vessel ischemic disease. 3. Chronic bilateral basal ganglia lacunar infarcts unchanged from prior head CT 10/21/2015. Electronically Signed   By: Kellie Simmering   On: 11/19/2018 17:09     Assessment/Plan:  83  y.o. male  Admitted with feeling off  balance and unsteady gait.   His right arm and right leg were having difficulty moving the way he wanted to.  He also had some slurred speech.  Symptoms improved.  MRI no acute abnormalities and his carotids don't have any stenosis/plaque  - s/p echo with prelim normal findings - d/c planning based on pt/ot - agree with ASA daily 11/20/2018, 11:36 AM

## 2018-11-21 LAB — ECHOCARDIOGRAM COMPLETE
Height: 68 in
Weight: 3496 oz

## 2018-11-22 DIAGNOSIS — R531 Weakness: Secondary | ICD-10-CM | POA: Diagnosis not present

## 2018-11-22 DIAGNOSIS — E118 Type 2 diabetes mellitus with unspecified complications: Secondary | ICD-10-CM | POA: Diagnosis not present

## 2018-11-22 DIAGNOSIS — E1165 Type 2 diabetes mellitus with hyperglycemia: Secondary | ICD-10-CM | POA: Diagnosis not present

## 2018-11-22 DIAGNOSIS — I10 Essential (primary) hypertension: Secondary | ICD-10-CM | POA: Diagnosis not present

## 2018-11-26 DIAGNOSIS — I517 Cardiomegaly: Secondary | ICD-10-CM | POA: Diagnosis not present

## 2018-11-26 DIAGNOSIS — I10 Essential (primary) hypertension: Secondary | ICD-10-CM | POA: Diagnosis not present

## 2018-11-26 DIAGNOSIS — M109 Gout, unspecified: Secondary | ICD-10-CM | POA: Diagnosis not present

## 2018-11-26 DIAGNOSIS — J449 Chronic obstructive pulmonary disease, unspecified: Secondary | ICD-10-CM | POA: Diagnosis not present

## 2018-11-26 DIAGNOSIS — M1380 Other specified arthritis, unspecified site: Secondary | ICD-10-CM | POA: Diagnosis not present

## 2018-11-26 DIAGNOSIS — G8191 Hemiplegia, unspecified affecting right dominant side: Secondary | ICD-10-CM | POA: Diagnosis not present

## 2018-11-26 DIAGNOSIS — I69351 Hemiplegia and hemiparesis following cerebral infarction affecting right dominant side: Secondary | ICD-10-CM | POA: Diagnosis not present

## 2018-11-26 DIAGNOSIS — M064 Inflammatory polyarthropathy: Secondary | ICD-10-CM | POA: Diagnosis not present

## 2018-11-26 DIAGNOSIS — N183 Chronic kidney disease, stage 3 unspecified: Secondary | ICD-10-CM | POA: Diagnosis not present

## 2018-11-26 DIAGNOSIS — Z9181 History of falling: Secondary | ICD-10-CM | POA: Diagnosis not present

## 2018-11-26 DIAGNOSIS — Z85820 Personal history of malignant melanoma of skin: Secondary | ICD-10-CM | POA: Diagnosis not present

## 2018-11-26 DIAGNOSIS — R402233 Coma scale, best verbal response, inappropriate words, at hospital admission: Secondary | ICD-10-CM | POA: Diagnosis not present

## 2018-11-26 DIAGNOSIS — Z20828 Contact with and (suspected) exposure to other viral communicable diseases: Secondary | ICD-10-CM | POA: Diagnosis not present

## 2018-11-26 DIAGNOSIS — R471 Dysarthria and anarthria: Secondary | ICD-10-CM | POA: Diagnosis not present

## 2018-11-26 DIAGNOSIS — I69391 Dysphagia following cerebral infarction: Secondary | ICD-10-CM | POA: Diagnosis not present

## 2018-11-26 DIAGNOSIS — E785 Hyperlipidemia, unspecified: Secondary | ICD-10-CM | POA: Diagnosis not present

## 2018-11-26 DIAGNOSIS — R402143 Coma scale, eyes open, spontaneous, at hospital admission: Secondary | ICD-10-CM | POA: Diagnosis not present

## 2018-11-26 DIAGNOSIS — Z720 Tobacco use: Secondary | ICD-10-CM | POA: Diagnosis not present

## 2018-11-26 DIAGNOSIS — R531 Weakness: Secondary | ICD-10-CM | POA: Diagnosis not present

## 2018-11-26 DIAGNOSIS — I129 Hypertensive chronic kidney disease with stage 1 through stage 4 chronic kidney disease, or unspecified chronic kidney disease: Secondary | ICD-10-CM | POA: Diagnosis not present

## 2018-11-26 DIAGNOSIS — E1122 Type 2 diabetes mellitus with diabetic chronic kidney disease: Secondary | ICD-10-CM | POA: Diagnosis not present

## 2018-11-26 DIAGNOSIS — I639 Cerebral infarction, unspecified: Secondary | ICD-10-CM | POA: Diagnosis not present

## 2018-11-26 DIAGNOSIS — R29709 NIHSS score 9: Secondary | ICD-10-CM | POA: Diagnosis not present

## 2018-11-26 DIAGNOSIS — Z7982 Long term (current) use of aspirin: Secondary | ICD-10-CM | POA: Diagnosis not present

## 2018-11-26 DIAGNOSIS — R29810 Facial weakness: Secondary | ICD-10-CM | POA: Diagnosis not present

## 2018-11-26 DIAGNOSIS — R29898 Other symptoms and signs involving the musculoskeletal system: Secondary | ICD-10-CM | POA: Diagnosis not present

## 2018-11-26 DIAGNOSIS — R1310 Dysphagia, unspecified: Secondary | ICD-10-CM | POA: Diagnosis not present

## 2018-11-26 DIAGNOSIS — Z7951 Long term (current) use of inhaled steroids: Secondary | ICD-10-CM | POA: Diagnosis not present

## 2018-11-26 DIAGNOSIS — I63212 Cerebral infarction due to unspecified occlusion or stenosis of left vertebral arteries: Secondary | ICD-10-CM | POA: Diagnosis not present

## 2018-11-26 DIAGNOSIS — R402363 Coma scale, best motor response, obeys commands, at hospital admission: Secondary | ICD-10-CM | POA: Diagnosis not present

## 2018-11-27 DIAGNOSIS — I517 Cardiomegaly: Secondary | ICD-10-CM | POA: Diagnosis not present

## 2018-11-27 DIAGNOSIS — I10 Essential (primary) hypertension: Secondary | ICD-10-CM | POA: Diagnosis not present

## 2018-11-27 DIAGNOSIS — I639 Cerebral infarction, unspecified: Secondary | ICD-10-CM | POA: Diagnosis not present

## 2018-11-27 DIAGNOSIS — E785 Hyperlipidemia, unspecified: Secondary | ICD-10-CM | POA: Diagnosis not present

## 2018-11-28 DIAGNOSIS — I639 Cerebral infarction, unspecified: Secondary | ICD-10-CM | POA: Diagnosis not present

## 2018-11-28 DIAGNOSIS — I10 Essential (primary) hypertension: Secondary | ICD-10-CM | POA: Diagnosis not present

## 2018-11-28 DIAGNOSIS — E785 Hyperlipidemia, unspecified: Secondary | ICD-10-CM | POA: Diagnosis not present

## 2018-11-29 DIAGNOSIS — I10 Essential (primary) hypertension: Secondary | ICD-10-CM | POA: Diagnosis not present

## 2018-11-29 DIAGNOSIS — E785 Hyperlipidemia, unspecified: Secondary | ICD-10-CM | POA: Diagnosis not present

## 2018-11-29 DIAGNOSIS — I639 Cerebral infarction, unspecified: Secondary | ICD-10-CM | POA: Diagnosis not present

## 2018-11-30 DIAGNOSIS — E785 Hyperlipidemia, unspecified: Secondary | ICD-10-CM | POA: Diagnosis not present

## 2018-11-30 DIAGNOSIS — I639 Cerebral infarction, unspecified: Secondary | ICD-10-CM | POA: Diagnosis not present

## 2018-11-30 DIAGNOSIS — I10 Essential (primary) hypertension: Secondary | ICD-10-CM | POA: Diagnosis not present

## 2018-11-30 NOTE — Discharge Summary (Signed)
[place] [facility] [place] NAME: Isaac Stevens    MR#:  [id number]  DATE OF BIRTH:  July 29, 1935  DATE OF ADMISSION:  11/19/2018 ADMITTING PHYSICIAN: Loletha Grayer, MD  DATE OF DISCHARGE: 11/20/2018  6:18 PM  PRIMARY CARE PHYSICIAN: Adin Hector, MD   ADMISSION DIAGNOSIS:  Stroke [facility]) [I63.9] Acute ischemic stroke ([place]) [I63.9]  DISCHARGE DIAGNOSIS:  Active Problems:   Stroke [facility])   SECONDARY DIAGNOSIS:   Past Medical History:  Diagnosis Date  . CAFL (chronic airflow limitation) (HCC)   . CKD (chronic kidney disease)   . Diabetes mellitus without complication ([place])   . Hyperlipidemia   . Hypertension   . Rheumatoid arthritis ([place])      ADMITTING HISTORY  HISTORY OF PRESENT ILLNESS:  Isaac Stevens  is a 83 y.o. male started developing some dizziness yesterday around 82- 1pm.  Today around 10 AM he was not walking well and he was stumbling around and still feeling dizzy.  His right arm and right leg were having difficulty moving the way he wanted to.  He also had some slurred speech.  He went to his doctor's office this afternoon and really had some weakness on his right side and was sent to the ER for further evaluation.  In the ER he was seen by telemetry neurology and they recommended aspirin and stroke work-up.  Hospitalist services were contacted for further evaluation.  HOSPITAL COURSE:   1.  Right-sided incoordination and slurred speech.  Symptoms improved well and have resolved by the time of discharge.  CT head and MRI of the brain showed nothing acute.  Echocardiogram and carotid Dopplers with nothing significant needing intervention.  Patient started on aspirin and statin.  Seen by neurology and I discussed the case with Dr. Irish Elders.  Discharge home and follow-up as outpatient with physical therapy and neurology. 2.  Hypertension.  Norvasc held for permissive hypertension on admission.  This was resumed at  discharge. 3.  Type 2 diabetes mellitus with chronic kidney disease stage III.   Sliding scale in the hospital.  No change discharge medications 5.  Arthritis on Plaquenil and folic acid 6.  History of gout on allopurinol  Stable for discharge home  CONSULTS OBTAINED:  Treatment Team:  Leotis Pain, MD  DRUG ALLERGIES:   Allergies  Allergen Reactions  . Methotrexate Derivatives Shortness Of Breath  . Lisinopril Swelling  . Pseudoephedrine Hcl Other (See Comments)    confusion  . Sitagliptin Rash    DISCHARGE MEDICATIONS:   Allergies as of 11/20/2018      Reactions   Methotrexate Derivatives Shortness Of Breath   Lisinopril Swelling   Pseudoephedrine Hcl Other (See Comments)   confusion   Sitagliptin Rash      Medication List    TAKE these medications   allopurinol 100 MG tablet Commonly known as: ZYLOPRIM Take 100 mg by mouth daily.   amLODipine 5 MG tablet Commonly known as: NORVASC TAKE 1 TABLET BY MOUTH EVERY DAY   aspirin EC 81 MG tablet Take 1 tablet (81 mg total) by mouth daily.   atorvastatin 40 MG tablet Commonly known as: LIPITOR Take 1 tablet (40 mg total) by mouth daily at 6 PM.   cetirizine 10 MG tablet Commonly known as: ZYRTEC Take 10 mg by mouth daily.   EpiPen 2-Pak 0.3 mg/0.3 mL Soaj injection Generic drug: EPINEPHrine See admin instructions.   folic acid 1 MG tablet Commonly known as: FOLVITE TAKE  1 TABLET (1 MG TOTAL) BY MOUTH ONCE DAILY.   hydroxychloroquine 200 MG tablet Commonly known as: PLAQUENIL Take 400 mg by mouth daily.   metFORMIN 1000 MG tablet Commonly known as: GLUCOPHAGE Take 1,000 mg by mouth 2 (two) times daily with a meal.   tamsulosin 0.4 MG Caps capsule Commonly known as: FLOMAX Take 0.4 mg by mouth every evening. Take 30 minutes after same meal each day   vitamin B-12 1000 MCG tablet Commonly known as: CYANOCOBALAMIN Take 1,000 mcg by mouth daily.       Today   VITAL SIGNS:  Blood pressure  125/64, pulse 60, temperature (!) 97.3 F (36.3 C), temperature source Oral, resp. rate 18, height 5\' 8"  (1.727 m), weight 99.1 kg, SpO2 97 %.  I/O:  No intake or output data in the 24 hours ending 11/30/18 1323  PHYSICAL EXAMINATION:  Physical Exam  GENERAL:  83 y.o.-year-old patient lying in the bed with no acute distress.  LUNGS: Normal breath sounds bilaterally, no wheezing, rales,rhonchi or crepitation. No use of accessory muscles of respiration.  CARDIOVASCULAR: S1, S2 normal. No murmurs, rubs, or gallops.  ABDOMEN: Soft, non-tender, non-distended. Bowel sounds present. No organomegaly or mass.  NEUROLOGIC: Moves all 4 extremities. PSYCHIATRIC: The patient is alert and oriented x 3.  SKIN: No obvious rash, lesion, or ulcer.   DATA REVIEW:   CBC No results for input(s): WBC, HGB, HCT, PLT in the last 168 hours.  Chemistries  No results for input(s): NA, K, CL, CO2, GLUCOSE, BUN, CREATININE, CALCIUM, MG, AST, ALT, ALKPHOS, BILITOT in the last 168 hours.  Invalid input(s): GFRCGP  Cardiac Enzymes No results for input(s): TROPONINI in the last 168 hours.  Microbiology Results  Results for orders placed or performed during the hospital encounter of 11/19/18  SARS CORONAVIRUS 2 (TAT 6-24 HRS) Nasopharyngeal Nasopharyngeal Swab     Status: None   Collection Time: 11/19/18  5:28 PM   Specimen: Nasopharyngeal Swab  Result Value Ref Range Status   SARS Coronavirus 2 NEGATIVE NEGATIVE Final    Comment: (NOTE) SARS-CoV-2 target nucleic acids are NOT DETECTED. The SARS-CoV-2 RNA is generally detectable in upper and lower respiratory specimens during the acute phase of infection. Negative results do not preclude SARS-CoV-2 infection, do not rule out co-infections with other pathogens, and should not be used as the sole basis for treatment or other patient management decisions. Negative results must be combined with clinical observations, patient history, and epidemiological  information. The expected result is Negative. Fact Sheet for Patients: SugarRoll.be Fact Sheet for Healthcare Providers: https://www.woods-mathews.com/ This test is not yet approved or cleared by the [place] FDA and  has been authorized for detection and/or diagnosis of SARS-CoV-2 by FDA under an Emergency Use Authorization (EUA). This EUA will remain  in effect (meaning this test can be used) for the duration of the COVID-19 declaration under Section 56 4(b)(1) of the Act, 21 U.S.C. section 360bbb-3(b)(1), unless the authorization is terminated or revoked sooner. Performed at [facility], [place] [address], [place], [place] [address]     RADIOLOGY:  No results found.  Follow up with PCP in 1 week.  Management plans discussed with the patient, family and they are in agreement.  CODE STATUS:  Code Status History    Date Active Date Inactive Code Status Order ID Comments User Context   10/22/2015 0044 10/22/2015 1449 Full Code [id number]  [name], DO Inpatient   03/11/2015 1652 03/14/2015 1412 Full Code [id number]  Gladstone Lighter, MD Inpatient   Advance Care Planning Activity      TOTAL TIME TAKING CARE OF THIS PATIENT ON DAY OF DISCHARGE: more than 30 minutes.   Leia Alf Bettyjean Stefanski M.D on 11/30/2018 at 1:23 PM  Between 7am to 6pm - Pager - [phone]  After 6pm go to [email or web address] - password [email or web address] [facility]  [facility]  Office  [phone]  CC: Primary care physician; Adin Hector, MD  Note: This dictation was prepared with Dragon dictation along with smaller phrase technology. Any transcriptional errors that result from this process are unintentional.

## 2018-12-01 DIAGNOSIS — E785 Hyperlipidemia, unspecified: Secondary | ICD-10-CM | POA: Diagnosis not present

## 2018-12-01 DIAGNOSIS — I639 Cerebral infarction, unspecified: Secondary | ICD-10-CM | POA: Diagnosis not present

## 2018-12-01 DIAGNOSIS — I10 Essential (primary) hypertension: Secondary | ICD-10-CM | POA: Diagnosis not present

## 2018-12-02 DIAGNOSIS — I639 Cerebral infarction, unspecified: Secondary | ICD-10-CM | POA: Diagnosis not present

## 2018-12-03 DIAGNOSIS — I639 Cerebral infarction, unspecified: Secondary | ICD-10-CM | POA: Diagnosis not present

## 2018-12-04 DIAGNOSIS — I639 Cerebral infarction, unspecified: Secondary | ICD-10-CM | POA: Diagnosis not present

## 2018-12-05 DIAGNOSIS — I63512 Cerebral infarction due to unspecified occlusion or stenosis of left middle cerebral artery: Secondary | ICD-10-CM | POA: Diagnosis not present

## 2018-12-05 DIAGNOSIS — N400 Enlarged prostate without lower urinary tract symptoms: Secondary | ICD-10-CM | POA: Diagnosis not present

## 2018-12-05 DIAGNOSIS — I639 Cerebral infarction, unspecified: Secondary | ICD-10-CM | POA: Diagnosis not present

## 2018-12-05 DIAGNOSIS — N183 Chronic kidney disease, stage 3 unspecified: Secondary | ICD-10-CM | POA: Diagnosis not present

## 2018-12-05 DIAGNOSIS — Z23 Encounter for immunization: Secondary | ICD-10-CM | POA: Diagnosis not present

## 2018-12-05 DIAGNOSIS — J449 Chronic obstructive pulmonary disease, unspecified: Secondary | ICD-10-CM | POA: Diagnosis not present

## 2018-12-05 DIAGNOSIS — E1122 Type 2 diabetes mellitus with diabetic chronic kidney disease: Secondary | ICD-10-CM | POA: Diagnosis not present

## 2018-12-05 DIAGNOSIS — Z85820 Personal history of malignant melanoma of skin: Secondary | ICD-10-CM | POA: Diagnosis not present

## 2018-12-05 DIAGNOSIS — Z7984 Long term (current) use of oral hypoglycemic drugs: Secondary | ICD-10-CM | POA: Diagnosis not present

## 2018-12-05 DIAGNOSIS — I959 Hypotension, unspecified: Secondary | ICD-10-CM | POA: Diagnosis not present

## 2018-12-05 DIAGNOSIS — I69322 Dysarthria following cerebral infarction: Secondary | ICD-10-CM | POA: Diagnosis not present

## 2018-12-05 DIAGNOSIS — R633 Feeding difficulties: Secondary | ICD-10-CM | POA: Diagnosis not present

## 2018-12-05 DIAGNOSIS — R5383 Other fatigue: Secondary | ICD-10-CM | POA: Diagnosis not present

## 2018-12-05 DIAGNOSIS — I69391 Dysphagia following cerebral infarction: Secondary | ICD-10-CM | POA: Diagnosis not present

## 2018-12-05 DIAGNOSIS — F17220 Nicotine dependence, chewing tobacco, uncomplicated: Secondary | ICD-10-CM | POA: Diagnosis not present

## 2018-12-05 DIAGNOSIS — M6281 Muscle weakness (generalized): Secondary | ICD-10-CM | POA: Diagnosis not present

## 2018-12-05 DIAGNOSIS — Z7401 Bed confinement status: Secondary | ICD-10-CM | POA: Diagnosis not present

## 2018-12-05 DIAGNOSIS — Z20828 Contact with and (suspected) exposure to other viral communicable diseases: Secondary | ICD-10-CM | POA: Diagnosis not present

## 2018-12-05 DIAGNOSIS — M1380 Other specified arthritis, unspecified site: Secondary | ICD-10-CM | POA: Diagnosis not present

## 2018-12-05 DIAGNOSIS — R1310 Dysphagia, unspecified: Secondary | ICD-10-CM | POA: Diagnosis not present

## 2018-12-05 DIAGNOSIS — E785 Hyperlipidemia, unspecified: Secondary | ICD-10-CM | POA: Diagnosis not present

## 2018-12-05 DIAGNOSIS — I69318 Other symptoms and signs involving cognitive functions following cerebral infarction: Secondary | ICD-10-CM | POA: Diagnosis not present

## 2018-12-05 DIAGNOSIS — I69351 Hemiplegia and hemiparesis following cerebral infarction affecting right dominant side: Secondary | ICD-10-CM | POA: Diagnosis not present

## 2018-12-05 DIAGNOSIS — R52 Pain, unspecified: Secondary | ICD-10-CM | POA: Diagnosis not present

## 2018-12-05 DIAGNOSIS — M109 Gout, unspecified: Secondary | ICD-10-CM | POA: Diagnosis not present

## 2018-12-05 DIAGNOSIS — I129 Hypertensive chronic kidney disease with stage 1 through stage 4 chronic kidney disease, or unspecified chronic kidney disease: Secondary | ICD-10-CM | POA: Diagnosis not present

## 2018-12-06 DIAGNOSIS — I63512 Cerebral infarction due to unspecified occlusion or stenosis of left middle cerebral artery: Secondary | ICD-10-CM | POA: Diagnosis not present

## 2018-12-22 DIAGNOSIS — E785 Hyperlipidemia, unspecified: Secondary | ICD-10-CM | POA: Diagnosis not present

## 2018-12-22 DIAGNOSIS — N183 Chronic kidney disease, stage 3 unspecified: Secondary | ICD-10-CM | POA: Diagnosis not present

## 2018-12-22 DIAGNOSIS — I69322 Dysarthria following cerebral infarction: Secondary | ICD-10-CM | POA: Diagnosis not present

## 2018-12-22 DIAGNOSIS — I69351 Hemiplegia and hemiparesis following cerebral infarction affecting right dominant side: Secondary | ICD-10-CM | POA: Diagnosis not present

## 2018-12-22 DIAGNOSIS — Z8719 Personal history of other diseases of the digestive system: Secondary | ICD-10-CM | POA: Diagnosis not present

## 2018-12-22 DIAGNOSIS — E538 Deficiency of other specified B group vitamins: Secondary | ICD-10-CM | POA: Diagnosis not present

## 2018-12-22 DIAGNOSIS — I129 Hypertensive chronic kidney disease with stage 1 through stage 4 chronic kidney disease, or unspecified chronic kidney disease: Secondary | ICD-10-CM | POA: Diagnosis not present

## 2018-12-22 DIAGNOSIS — Z7982 Long term (current) use of aspirin: Secondary | ICD-10-CM | POA: Diagnosis not present

## 2018-12-22 DIAGNOSIS — Z9181 History of falling: Secondary | ICD-10-CM | POA: Diagnosis not present

## 2018-12-22 DIAGNOSIS — J449 Chronic obstructive pulmonary disease, unspecified: Secondary | ICD-10-CM | POA: Diagnosis not present

## 2018-12-22 DIAGNOSIS — M109 Gout, unspecified: Secondary | ICD-10-CM | POA: Diagnosis not present

## 2018-12-22 DIAGNOSIS — E1122 Type 2 diabetes mellitus with diabetic chronic kidney disease: Secondary | ICD-10-CM | POA: Diagnosis not present

## 2018-12-22 DIAGNOSIS — I69391 Dysphagia following cerebral infarction: Secondary | ICD-10-CM | POA: Diagnosis not present

## 2018-12-24 DIAGNOSIS — I69391 Dysphagia following cerebral infarction: Secondary | ICD-10-CM | POA: Diagnosis not present

## 2018-12-24 DIAGNOSIS — J449 Chronic obstructive pulmonary disease, unspecified: Secondary | ICD-10-CM | POA: Diagnosis not present

## 2018-12-24 DIAGNOSIS — Z8673 Personal history of transient ischemic attack (TIA), and cerebral infarction without residual deficits: Secondary | ICD-10-CM | POA: Diagnosis not present

## 2018-12-24 DIAGNOSIS — N183 Chronic kidney disease, stage 3 unspecified: Secondary | ICD-10-CM | POA: Diagnosis not present

## 2018-12-24 DIAGNOSIS — I10 Essential (primary) hypertension: Secondary | ICD-10-CM | POA: Diagnosis not present

## 2018-12-24 DIAGNOSIS — E538 Deficiency of other specified B group vitamins: Secondary | ICD-10-CM | POA: Diagnosis not present

## 2018-12-24 DIAGNOSIS — E7849 Other hyperlipidemia: Secondary | ICD-10-CM | POA: Diagnosis not present

## 2018-12-24 DIAGNOSIS — E118 Type 2 diabetes mellitus with unspecified complications: Secondary | ICD-10-CM | POA: Diagnosis not present

## 2018-12-24 DIAGNOSIS — M064 Inflammatory polyarthropathy: Secondary | ICD-10-CM | POA: Diagnosis not present

## 2018-12-24 DIAGNOSIS — D692 Other nonthrombocytopenic purpura: Secondary | ICD-10-CM | POA: Diagnosis not present

## 2018-12-24 DIAGNOSIS — E1165 Type 2 diabetes mellitus with hyperglycemia: Secondary | ICD-10-CM | POA: Diagnosis not present

## 2018-12-25 DIAGNOSIS — N183 Chronic kidney disease, stage 3 unspecified: Secondary | ICD-10-CM | POA: Diagnosis not present

## 2018-12-25 DIAGNOSIS — Z7982 Long term (current) use of aspirin: Secondary | ICD-10-CM | POA: Diagnosis not present

## 2018-12-25 DIAGNOSIS — M109 Gout, unspecified: Secondary | ICD-10-CM | POA: Diagnosis not present

## 2018-12-25 DIAGNOSIS — I129 Hypertensive chronic kidney disease with stage 1 through stage 4 chronic kidney disease, or unspecified chronic kidney disease: Secondary | ICD-10-CM | POA: Diagnosis not present

## 2018-12-25 DIAGNOSIS — I69322 Dysarthria following cerebral infarction: Secondary | ICD-10-CM | POA: Diagnosis not present

## 2018-12-25 DIAGNOSIS — J449 Chronic obstructive pulmonary disease, unspecified: Secondary | ICD-10-CM | POA: Diagnosis not present

## 2018-12-25 DIAGNOSIS — E538 Deficiency of other specified B group vitamins: Secondary | ICD-10-CM | POA: Diagnosis not present

## 2018-12-25 DIAGNOSIS — I69391 Dysphagia following cerebral infarction: Secondary | ICD-10-CM | POA: Diagnosis not present

## 2018-12-25 DIAGNOSIS — Z9181 History of falling: Secondary | ICD-10-CM | POA: Diagnosis not present

## 2018-12-25 DIAGNOSIS — E785 Hyperlipidemia, unspecified: Secondary | ICD-10-CM | POA: Diagnosis not present

## 2018-12-25 DIAGNOSIS — Z8719 Personal history of other diseases of the digestive system: Secondary | ICD-10-CM | POA: Diagnosis not present

## 2018-12-25 DIAGNOSIS — I69351 Hemiplegia and hemiparesis following cerebral infarction affecting right dominant side: Secondary | ICD-10-CM | POA: Diagnosis not present

## 2018-12-25 DIAGNOSIS — E1122 Type 2 diabetes mellitus with diabetic chronic kidney disease: Secondary | ICD-10-CM | POA: Diagnosis not present

## 2018-12-26 DIAGNOSIS — E785 Hyperlipidemia, unspecified: Secondary | ICD-10-CM | POA: Diagnosis not present

## 2018-12-26 DIAGNOSIS — E1122 Type 2 diabetes mellitus with diabetic chronic kidney disease: Secondary | ICD-10-CM | POA: Diagnosis not present

## 2018-12-26 DIAGNOSIS — M109 Gout, unspecified: Secondary | ICD-10-CM | POA: Diagnosis not present

## 2018-12-26 DIAGNOSIS — I69351 Hemiplegia and hemiparesis following cerebral infarction affecting right dominant side: Secondary | ICD-10-CM | POA: Diagnosis not present

## 2018-12-26 DIAGNOSIS — J449 Chronic obstructive pulmonary disease, unspecified: Secondary | ICD-10-CM | POA: Diagnosis not present

## 2018-12-26 DIAGNOSIS — E538 Deficiency of other specified B group vitamins: Secondary | ICD-10-CM | POA: Diagnosis not present

## 2018-12-26 DIAGNOSIS — Z9181 History of falling: Secondary | ICD-10-CM | POA: Diagnosis not present

## 2018-12-26 DIAGNOSIS — I129 Hypertensive chronic kidney disease with stage 1 through stage 4 chronic kidney disease, or unspecified chronic kidney disease: Secondary | ICD-10-CM | POA: Diagnosis not present

## 2018-12-26 DIAGNOSIS — I69322 Dysarthria following cerebral infarction: Secondary | ICD-10-CM | POA: Diagnosis not present

## 2018-12-26 DIAGNOSIS — N183 Chronic kidney disease, stage 3 unspecified: Secondary | ICD-10-CM | POA: Diagnosis not present

## 2018-12-26 DIAGNOSIS — Z8719 Personal history of other diseases of the digestive system: Secondary | ICD-10-CM | POA: Diagnosis not present

## 2018-12-26 DIAGNOSIS — I69391 Dysphagia following cerebral infarction: Secondary | ICD-10-CM | POA: Diagnosis not present

## 2018-12-26 DIAGNOSIS — Z7982 Long term (current) use of aspirin: Secondary | ICD-10-CM | POA: Diagnosis not present

## 2019-01-01 DIAGNOSIS — E7849 Other hyperlipidemia: Secondary | ICD-10-CM | POA: Diagnosis not present

## 2019-01-01 DIAGNOSIS — E538 Deficiency of other specified B group vitamins: Secondary | ICD-10-CM | POA: Diagnosis not present

## 2019-01-01 DIAGNOSIS — E1165 Type 2 diabetes mellitus with hyperglycemia: Secondary | ICD-10-CM | POA: Diagnosis not present

## 2019-01-01 DIAGNOSIS — E118 Type 2 diabetes mellitus with unspecified complications: Secondary | ICD-10-CM | POA: Diagnosis not present

## 2019-01-03 DIAGNOSIS — I639 Cerebral infarction, unspecified: Secondary | ICD-10-CM | POA: Diagnosis not present

## 2019-01-06 DIAGNOSIS — I69351 Hemiplegia and hemiparesis following cerebral infarction affecting right dominant side: Secondary | ICD-10-CM | POA: Diagnosis not present

## 2019-01-06 DIAGNOSIS — Z9181 History of falling: Secondary | ICD-10-CM | POA: Diagnosis not present

## 2019-01-06 DIAGNOSIS — J449 Chronic obstructive pulmonary disease, unspecified: Secondary | ICD-10-CM | POA: Diagnosis not present

## 2019-01-06 DIAGNOSIS — N183 Chronic kidney disease, stage 3 unspecified: Secondary | ICD-10-CM | POA: Diagnosis not present

## 2019-01-06 DIAGNOSIS — I129 Hypertensive chronic kidney disease with stage 1 through stage 4 chronic kidney disease, or unspecified chronic kidney disease: Secondary | ICD-10-CM | POA: Diagnosis not present

## 2019-01-06 DIAGNOSIS — E1122 Type 2 diabetes mellitus with diabetic chronic kidney disease: Secondary | ICD-10-CM | POA: Diagnosis not present

## 2019-01-06 DIAGNOSIS — Z7982 Long term (current) use of aspirin: Secondary | ICD-10-CM | POA: Diagnosis not present

## 2019-01-06 DIAGNOSIS — I69322 Dysarthria following cerebral infarction: Secondary | ICD-10-CM | POA: Diagnosis not present

## 2019-01-06 DIAGNOSIS — Z8719 Personal history of other diseases of the digestive system: Secondary | ICD-10-CM | POA: Diagnosis not present

## 2019-01-06 DIAGNOSIS — M109 Gout, unspecified: Secondary | ICD-10-CM | POA: Diagnosis not present

## 2019-01-06 DIAGNOSIS — E538 Deficiency of other specified B group vitamins: Secondary | ICD-10-CM | POA: Diagnosis not present

## 2019-01-06 DIAGNOSIS — I69391 Dysphagia following cerebral infarction: Secondary | ICD-10-CM | POA: Diagnosis not present

## 2019-01-06 DIAGNOSIS — E785 Hyperlipidemia, unspecified: Secondary | ICD-10-CM | POA: Diagnosis not present

## 2019-01-07 DIAGNOSIS — E785 Hyperlipidemia, unspecified: Secondary | ICD-10-CM | POA: Diagnosis not present

## 2019-01-07 DIAGNOSIS — I69391 Dysphagia following cerebral infarction: Secondary | ICD-10-CM | POA: Diagnosis not present

## 2019-01-07 DIAGNOSIS — Z8719 Personal history of other diseases of the digestive system: Secondary | ICD-10-CM | POA: Diagnosis not present

## 2019-01-07 DIAGNOSIS — J449 Chronic obstructive pulmonary disease, unspecified: Secondary | ICD-10-CM | POA: Diagnosis not present

## 2019-01-07 DIAGNOSIS — M109 Gout, unspecified: Secondary | ICD-10-CM | POA: Diagnosis not present

## 2019-01-07 DIAGNOSIS — Z9181 History of falling: Secondary | ICD-10-CM | POA: Diagnosis not present

## 2019-01-07 DIAGNOSIS — I129 Hypertensive chronic kidney disease with stage 1 through stage 4 chronic kidney disease, or unspecified chronic kidney disease: Secondary | ICD-10-CM | POA: Diagnosis not present

## 2019-01-07 DIAGNOSIS — E1122 Type 2 diabetes mellitus with diabetic chronic kidney disease: Secondary | ICD-10-CM | POA: Diagnosis not present

## 2019-01-07 DIAGNOSIS — E538 Deficiency of other specified B group vitamins: Secondary | ICD-10-CM | POA: Diagnosis not present

## 2019-01-07 DIAGNOSIS — I69351 Hemiplegia and hemiparesis following cerebral infarction affecting right dominant side: Secondary | ICD-10-CM | POA: Diagnosis not present

## 2019-01-07 DIAGNOSIS — I69322 Dysarthria following cerebral infarction: Secondary | ICD-10-CM | POA: Diagnosis not present

## 2019-01-07 DIAGNOSIS — Z7982 Long term (current) use of aspirin: Secondary | ICD-10-CM | POA: Diagnosis not present

## 2019-01-07 DIAGNOSIS — N183 Chronic kidney disease, stage 3 unspecified: Secondary | ICD-10-CM | POA: Diagnosis not present

## 2019-01-08 DIAGNOSIS — Z9181 History of falling: Secondary | ICD-10-CM | POA: Diagnosis not present

## 2019-01-08 DIAGNOSIS — Z7982 Long term (current) use of aspirin: Secondary | ICD-10-CM | POA: Diagnosis not present

## 2019-01-08 DIAGNOSIS — M109 Gout, unspecified: Secondary | ICD-10-CM | POA: Diagnosis not present

## 2019-01-08 DIAGNOSIS — I69322 Dysarthria following cerebral infarction: Secondary | ICD-10-CM | POA: Diagnosis not present

## 2019-01-08 DIAGNOSIS — I69351 Hemiplegia and hemiparesis following cerebral infarction affecting right dominant side: Secondary | ICD-10-CM | POA: Diagnosis not present

## 2019-01-08 DIAGNOSIS — N183 Chronic kidney disease, stage 3 unspecified: Secondary | ICD-10-CM | POA: Diagnosis not present

## 2019-01-08 DIAGNOSIS — E1122 Type 2 diabetes mellitus with diabetic chronic kidney disease: Secondary | ICD-10-CM | POA: Diagnosis not present

## 2019-01-08 DIAGNOSIS — E538 Deficiency of other specified B group vitamins: Secondary | ICD-10-CM | POA: Diagnosis not present

## 2019-01-08 DIAGNOSIS — E785 Hyperlipidemia, unspecified: Secondary | ICD-10-CM | POA: Diagnosis not present

## 2019-01-08 DIAGNOSIS — I69391 Dysphagia following cerebral infarction: Secondary | ICD-10-CM | POA: Diagnosis not present

## 2019-01-08 DIAGNOSIS — J449 Chronic obstructive pulmonary disease, unspecified: Secondary | ICD-10-CM | POA: Diagnosis not present

## 2019-01-08 DIAGNOSIS — Z8719 Personal history of other diseases of the digestive system: Secondary | ICD-10-CM | POA: Diagnosis not present

## 2019-01-08 DIAGNOSIS — I129 Hypertensive chronic kidney disease with stage 1 through stage 4 chronic kidney disease, or unspecified chronic kidney disease: Secondary | ICD-10-CM | POA: Diagnosis not present

## 2019-01-09 DIAGNOSIS — I10 Essential (primary) hypertension: Secondary | ICD-10-CM | POA: Diagnosis not present

## 2019-01-09 DIAGNOSIS — D692 Other nonthrombocytopenic purpura: Secondary | ICD-10-CM | POA: Diagnosis not present

## 2019-01-09 DIAGNOSIS — R768 Other specified abnormal immunological findings in serum: Secondary | ICD-10-CM | POA: Diagnosis not present

## 2019-01-09 DIAGNOSIS — Z9889 Other specified postprocedural states: Secondary | ICD-10-CM | POA: Diagnosis not present

## 2019-01-09 DIAGNOSIS — M064 Inflammatory polyarthropathy: Secondary | ICD-10-CM | POA: Diagnosis not present

## 2019-01-09 DIAGNOSIS — N1831 Chronic kidney disease, stage 3a: Secondary | ICD-10-CM | POA: Diagnosis not present

## 2019-01-09 DIAGNOSIS — E118 Type 2 diabetes mellitus with unspecified complications: Secondary | ICD-10-CM | POA: Diagnosis not present

## 2019-01-09 DIAGNOSIS — E7849 Other hyperlipidemia: Secondary | ICD-10-CM | POA: Diagnosis not present

## 2019-01-09 DIAGNOSIS — Z8673 Personal history of transient ischemic attack (TIA), and cerebral infarction without residual deficits: Secondary | ICD-10-CM | POA: Diagnosis not present

## 2019-01-09 DIAGNOSIS — I69391 Dysphagia following cerebral infarction: Secondary | ICD-10-CM | POA: Diagnosis not present

## 2019-01-09 DIAGNOSIS — J449 Chronic obstructive pulmonary disease, unspecified: Secondary | ICD-10-CM | POA: Diagnosis not present

## 2019-01-09 DIAGNOSIS — E538 Deficiency of other specified B group vitamins: Secondary | ICD-10-CM | POA: Diagnosis not present

## 2019-01-13 DIAGNOSIS — I69351 Hemiplegia and hemiparesis following cerebral infarction affecting right dominant side: Secondary | ICD-10-CM | POA: Diagnosis not present

## 2019-01-13 DIAGNOSIS — I129 Hypertensive chronic kidney disease with stage 1 through stage 4 chronic kidney disease, or unspecified chronic kidney disease: Secondary | ICD-10-CM | POA: Diagnosis not present

## 2019-01-13 DIAGNOSIS — Z7982 Long term (current) use of aspirin: Secondary | ICD-10-CM | POA: Diagnosis not present

## 2019-01-13 DIAGNOSIS — M109 Gout, unspecified: Secondary | ICD-10-CM | POA: Diagnosis not present

## 2019-01-13 DIAGNOSIS — E538 Deficiency of other specified B group vitamins: Secondary | ICD-10-CM | POA: Diagnosis not present

## 2019-01-13 DIAGNOSIS — E785 Hyperlipidemia, unspecified: Secondary | ICD-10-CM | POA: Diagnosis not present

## 2019-01-13 DIAGNOSIS — Z9181 History of falling: Secondary | ICD-10-CM | POA: Diagnosis not present

## 2019-01-13 DIAGNOSIS — N183 Chronic kidney disease, stage 3 unspecified: Secondary | ICD-10-CM | POA: Diagnosis not present

## 2019-01-13 DIAGNOSIS — J449 Chronic obstructive pulmonary disease, unspecified: Secondary | ICD-10-CM | POA: Diagnosis not present

## 2019-01-13 DIAGNOSIS — Z8719 Personal history of other diseases of the digestive system: Secondary | ICD-10-CM | POA: Diagnosis not present

## 2019-01-13 DIAGNOSIS — E1122 Type 2 diabetes mellitus with diabetic chronic kidney disease: Secondary | ICD-10-CM | POA: Diagnosis not present

## 2019-01-13 DIAGNOSIS — I69391 Dysphagia following cerebral infarction: Secondary | ICD-10-CM | POA: Diagnosis not present

## 2019-01-13 DIAGNOSIS — I69322 Dysarthria following cerebral infarction: Secondary | ICD-10-CM | POA: Diagnosis not present

## 2019-01-14 DIAGNOSIS — Z9181 History of falling: Secondary | ICD-10-CM | POA: Diagnosis not present

## 2019-01-14 DIAGNOSIS — M109 Gout, unspecified: Secondary | ICD-10-CM | POA: Diagnosis not present

## 2019-01-14 DIAGNOSIS — I69322 Dysarthria following cerebral infarction: Secondary | ICD-10-CM | POA: Diagnosis not present

## 2019-01-14 DIAGNOSIS — J449 Chronic obstructive pulmonary disease, unspecified: Secondary | ICD-10-CM | POA: Diagnosis not present

## 2019-01-14 DIAGNOSIS — I129 Hypertensive chronic kidney disease with stage 1 through stage 4 chronic kidney disease, or unspecified chronic kidney disease: Secondary | ICD-10-CM | POA: Diagnosis not present

## 2019-01-14 DIAGNOSIS — I69351 Hemiplegia and hemiparesis following cerebral infarction affecting right dominant side: Secondary | ICD-10-CM | POA: Diagnosis not present

## 2019-01-14 DIAGNOSIS — N183 Chronic kidney disease, stage 3 unspecified: Secondary | ICD-10-CM | POA: Diagnosis not present

## 2019-01-14 DIAGNOSIS — E1122 Type 2 diabetes mellitus with diabetic chronic kidney disease: Secondary | ICD-10-CM | POA: Diagnosis not present

## 2019-01-14 DIAGNOSIS — E785 Hyperlipidemia, unspecified: Secondary | ICD-10-CM | POA: Diagnosis not present

## 2019-01-14 DIAGNOSIS — E538 Deficiency of other specified B group vitamins: Secondary | ICD-10-CM | POA: Diagnosis not present

## 2019-01-14 DIAGNOSIS — I69391 Dysphagia following cerebral infarction: Secondary | ICD-10-CM | POA: Diagnosis not present

## 2019-01-14 DIAGNOSIS — Z7982 Long term (current) use of aspirin: Secondary | ICD-10-CM | POA: Diagnosis not present

## 2019-01-14 DIAGNOSIS — Z8719 Personal history of other diseases of the digestive system: Secondary | ICD-10-CM | POA: Diagnosis not present

## 2019-01-21 DIAGNOSIS — I69391 Dysphagia following cerebral infarction: Secondary | ICD-10-CM | POA: Diagnosis not present

## 2019-01-21 DIAGNOSIS — N183 Chronic kidney disease, stage 3 unspecified: Secondary | ICD-10-CM | POA: Diagnosis not present

## 2019-01-21 DIAGNOSIS — E538 Deficiency of other specified B group vitamins: Secondary | ICD-10-CM | POA: Diagnosis not present

## 2019-01-21 DIAGNOSIS — M109 Gout, unspecified: Secondary | ICD-10-CM | POA: Diagnosis not present

## 2019-01-21 DIAGNOSIS — I129 Hypertensive chronic kidney disease with stage 1 through stage 4 chronic kidney disease, or unspecified chronic kidney disease: Secondary | ICD-10-CM | POA: Diagnosis not present

## 2019-01-21 DIAGNOSIS — E1122 Type 2 diabetes mellitus with diabetic chronic kidney disease: Secondary | ICD-10-CM | POA: Diagnosis not present

## 2019-01-21 DIAGNOSIS — Z8719 Personal history of other diseases of the digestive system: Secondary | ICD-10-CM | POA: Diagnosis not present

## 2019-01-21 DIAGNOSIS — Z7982 Long term (current) use of aspirin: Secondary | ICD-10-CM | POA: Diagnosis not present

## 2019-01-21 DIAGNOSIS — E785 Hyperlipidemia, unspecified: Secondary | ICD-10-CM | POA: Diagnosis not present

## 2019-01-21 DIAGNOSIS — I69322 Dysarthria following cerebral infarction: Secondary | ICD-10-CM | POA: Diagnosis not present

## 2019-01-21 DIAGNOSIS — J449 Chronic obstructive pulmonary disease, unspecified: Secondary | ICD-10-CM | POA: Diagnosis not present

## 2019-01-21 DIAGNOSIS — I69351 Hemiplegia and hemiparesis following cerebral infarction affecting right dominant side: Secondary | ICD-10-CM | POA: Diagnosis not present

## 2019-01-21 DIAGNOSIS — Z9181 History of falling: Secondary | ICD-10-CM | POA: Diagnosis not present

## 2019-01-29 DIAGNOSIS — N183 Chronic kidney disease, stage 3 unspecified: Secondary | ICD-10-CM | POA: Diagnosis not present

## 2019-01-29 DIAGNOSIS — Z8719 Personal history of other diseases of the digestive system: Secondary | ICD-10-CM | POA: Diagnosis not present

## 2019-01-29 DIAGNOSIS — I69322 Dysarthria following cerebral infarction: Secondary | ICD-10-CM | POA: Diagnosis not present

## 2019-01-29 DIAGNOSIS — I69391 Dysphagia following cerebral infarction: Secondary | ICD-10-CM | POA: Diagnosis not present

## 2019-01-29 DIAGNOSIS — Z9181 History of falling: Secondary | ICD-10-CM | POA: Diagnosis not present

## 2019-01-29 DIAGNOSIS — I69351 Hemiplegia and hemiparesis following cerebral infarction affecting right dominant side: Secondary | ICD-10-CM | POA: Diagnosis not present

## 2019-01-29 DIAGNOSIS — Z7982 Long term (current) use of aspirin: Secondary | ICD-10-CM | POA: Diagnosis not present

## 2019-01-29 DIAGNOSIS — E1122 Type 2 diabetes mellitus with diabetic chronic kidney disease: Secondary | ICD-10-CM | POA: Diagnosis not present

## 2019-01-29 DIAGNOSIS — J449 Chronic obstructive pulmonary disease, unspecified: Secondary | ICD-10-CM | POA: Diagnosis not present

## 2019-01-29 DIAGNOSIS — E785 Hyperlipidemia, unspecified: Secondary | ICD-10-CM | POA: Diagnosis not present

## 2019-01-29 DIAGNOSIS — I129 Hypertensive chronic kidney disease with stage 1 through stage 4 chronic kidney disease, or unspecified chronic kidney disease: Secondary | ICD-10-CM | POA: Diagnosis not present

## 2019-01-29 DIAGNOSIS — E538 Deficiency of other specified B group vitamins: Secondary | ICD-10-CM | POA: Diagnosis not present

## 2019-01-29 DIAGNOSIS — M109 Gout, unspecified: Secondary | ICD-10-CM | POA: Diagnosis not present

## 2019-02-03 DIAGNOSIS — E538 Deficiency of other specified B group vitamins: Secondary | ICD-10-CM | POA: Diagnosis not present

## 2019-02-03 DIAGNOSIS — I10 Essential (primary) hypertension: Secondary | ICD-10-CM | POA: Diagnosis not present

## 2019-02-03 DIAGNOSIS — N1831 Chronic kidney disease, stage 3a: Secondary | ICD-10-CM | POA: Diagnosis not present

## 2019-02-03 DIAGNOSIS — D692 Other nonthrombocytopenic purpura: Secondary | ICD-10-CM | POA: Diagnosis not present

## 2019-02-03 DIAGNOSIS — M1A00X0 Idiopathic chronic gout, unspecified site, without tophus (tophi): Secondary | ICD-10-CM | POA: Diagnosis not present

## 2019-02-04 DIAGNOSIS — I69391 Dysphagia following cerebral infarction: Secondary | ICD-10-CM | POA: Diagnosis not present

## 2019-02-04 DIAGNOSIS — M109 Gout, unspecified: Secondary | ICD-10-CM | POA: Diagnosis not present

## 2019-02-04 DIAGNOSIS — J449 Chronic obstructive pulmonary disease, unspecified: Secondary | ICD-10-CM | POA: Diagnosis not present

## 2019-02-04 DIAGNOSIS — I129 Hypertensive chronic kidney disease with stage 1 through stage 4 chronic kidney disease, or unspecified chronic kidney disease: Secondary | ICD-10-CM | POA: Diagnosis not present

## 2019-02-04 DIAGNOSIS — Z9181 History of falling: Secondary | ICD-10-CM | POA: Diagnosis not present

## 2019-02-04 DIAGNOSIS — E538 Deficiency of other specified B group vitamins: Secondary | ICD-10-CM | POA: Diagnosis not present

## 2019-02-04 DIAGNOSIS — I69351 Hemiplegia and hemiparesis following cerebral infarction affecting right dominant side: Secondary | ICD-10-CM | POA: Diagnosis not present

## 2019-02-04 DIAGNOSIS — E1122 Type 2 diabetes mellitus with diabetic chronic kidney disease: Secondary | ICD-10-CM | POA: Diagnosis not present

## 2019-02-04 DIAGNOSIS — Z7982 Long term (current) use of aspirin: Secondary | ICD-10-CM | POA: Diagnosis not present

## 2019-02-04 DIAGNOSIS — Z8719 Personal history of other diseases of the digestive system: Secondary | ICD-10-CM | POA: Diagnosis not present

## 2019-02-04 DIAGNOSIS — I69322 Dysarthria following cerebral infarction: Secondary | ICD-10-CM | POA: Diagnosis not present

## 2019-02-04 DIAGNOSIS — N183 Chronic kidney disease, stage 3 unspecified: Secondary | ICD-10-CM | POA: Diagnosis not present

## 2019-02-04 DIAGNOSIS — E785 Hyperlipidemia, unspecified: Secondary | ICD-10-CM | POA: Diagnosis not present

## 2019-02-06 DIAGNOSIS — J449 Chronic obstructive pulmonary disease, unspecified: Secondary | ICD-10-CM | POA: Diagnosis not present

## 2019-02-06 DIAGNOSIS — Z9181 History of falling: Secondary | ICD-10-CM | POA: Diagnosis not present

## 2019-02-06 DIAGNOSIS — Z7982 Long term (current) use of aspirin: Secondary | ICD-10-CM | POA: Diagnosis not present

## 2019-02-06 DIAGNOSIS — E785 Hyperlipidemia, unspecified: Secondary | ICD-10-CM | POA: Diagnosis not present

## 2019-02-06 DIAGNOSIS — E538 Deficiency of other specified B group vitamins: Secondary | ICD-10-CM | POA: Diagnosis not present

## 2019-02-06 DIAGNOSIS — I129 Hypertensive chronic kidney disease with stage 1 through stage 4 chronic kidney disease, or unspecified chronic kidney disease: Secondary | ICD-10-CM | POA: Diagnosis not present

## 2019-02-06 DIAGNOSIS — I69391 Dysphagia following cerebral infarction: Secondary | ICD-10-CM | POA: Diagnosis not present

## 2019-02-06 DIAGNOSIS — Z8719 Personal history of other diseases of the digestive system: Secondary | ICD-10-CM | POA: Diagnosis not present

## 2019-02-06 DIAGNOSIS — M109 Gout, unspecified: Secondary | ICD-10-CM | POA: Diagnosis not present

## 2019-02-06 DIAGNOSIS — E1122 Type 2 diabetes mellitus with diabetic chronic kidney disease: Secondary | ICD-10-CM | POA: Diagnosis not present

## 2019-02-06 DIAGNOSIS — N183 Chronic kidney disease, stage 3 unspecified: Secondary | ICD-10-CM | POA: Diagnosis not present

## 2019-02-06 DIAGNOSIS — I69351 Hemiplegia and hemiparesis following cerebral infarction affecting right dominant side: Secondary | ICD-10-CM | POA: Diagnosis not present

## 2019-02-06 DIAGNOSIS — I69322 Dysarthria following cerebral infarction: Secondary | ICD-10-CM | POA: Diagnosis not present

## 2019-02-10 DIAGNOSIS — N1831 Chronic kidney disease, stage 3a: Secondary | ICD-10-CM | POA: Diagnosis not present

## 2019-02-10 DIAGNOSIS — E7849 Other hyperlipidemia: Secondary | ICD-10-CM | POA: Diagnosis not present

## 2019-02-10 DIAGNOSIS — E1165 Type 2 diabetes mellitus with hyperglycemia: Secondary | ICD-10-CM | POA: Diagnosis not present

## 2019-02-10 DIAGNOSIS — J449 Chronic obstructive pulmonary disease, unspecified: Secondary | ICD-10-CM | POA: Diagnosis not present

## 2019-02-10 DIAGNOSIS — I10 Essential (primary) hypertension: Secondary | ICD-10-CM | POA: Diagnosis not present

## 2019-02-10 DIAGNOSIS — M064 Inflammatory polyarthropathy: Secondary | ICD-10-CM | POA: Diagnosis not present

## 2019-02-10 DIAGNOSIS — E538 Deficiency of other specified B group vitamins: Secondary | ICD-10-CM | POA: Diagnosis not present

## 2019-02-10 DIAGNOSIS — Z8673 Personal history of transient ischemic attack (TIA), and cerebral infarction without residual deficits: Secondary | ICD-10-CM | POA: Diagnosis not present

## 2019-02-10 DIAGNOSIS — E118 Type 2 diabetes mellitus with unspecified complications: Secondary | ICD-10-CM | POA: Diagnosis not present

## 2019-02-10 DIAGNOSIS — M7501 Adhesive capsulitis of right shoulder: Secondary | ICD-10-CM | POA: Diagnosis not present

## 2019-02-11 DIAGNOSIS — I69322 Dysarthria following cerebral infarction: Secondary | ICD-10-CM | POA: Diagnosis not present

## 2019-02-11 DIAGNOSIS — I129 Hypertensive chronic kidney disease with stage 1 through stage 4 chronic kidney disease, or unspecified chronic kidney disease: Secondary | ICD-10-CM | POA: Diagnosis not present

## 2019-02-11 DIAGNOSIS — Z9181 History of falling: Secondary | ICD-10-CM | POA: Diagnosis not present

## 2019-02-11 DIAGNOSIS — Z8719 Personal history of other diseases of the digestive system: Secondary | ICD-10-CM | POA: Diagnosis not present

## 2019-02-11 DIAGNOSIS — E785 Hyperlipidemia, unspecified: Secondary | ICD-10-CM | POA: Diagnosis not present

## 2019-02-11 DIAGNOSIS — E538 Deficiency of other specified B group vitamins: Secondary | ICD-10-CM | POA: Diagnosis not present

## 2019-02-11 DIAGNOSIS — Z7982 Long term (current) use of aspirin: Secondary | ICD-10-CM | POA: Diagnosis not present

## 2019-02-11 DIAGNOSIS — J449 Chronic obstructive pulmonary disease, unspecified: Secondary | ICD-10-CM | POA: Diagnosis not present

## 2019-02-11 DIAGNOSIS — M109 Gout, unspecified: Secondary | ICD-10-CM | POA: Diagnosis not present

## 2019-02-11 DIAGNOSIS — N183 Chronic kidney disease, stage 3 unspecified: Secondary | ICD-10-CM | POA: Diagnosis not present

## 2019-02-11 DIAGNOSIS — I69351 Hemiplegia and hemiparesis following cerebral infarction affecting right dominant side: Secondary | ICD-10-CM | POA: Diagnosis not present

## 2019-02-11 DIAGNOSIS — E1122 Type 2 diabetes mellitus with diabetic chronic kidney disease: Secondary | ICD-10-CM | POA: Diagnosis not present

## 2019-02-11 DIAGNOSIS — I69391 Dysphagia following cerebral infarction: Secondary | ICD-10-CM | POA: Diagnosis not present

## 2019-02-14 DIAGNOSIS — I6930 Unspecified sequelae of cerebral infarction: Secondary | ICD-10-CM | POA: Diagnosis not present

## 2019-02-17 ENCOUNTER — Ambulatory Visit: Payer: PPO | Attending: Internal Medicine

## 2019-02-17 DIAGNOSIS — Z20822 Contact with and (suspected) exposure to covid-19: Secondary | ICD-10-CM

## 2019-02-18 LAB — NOVEL CORONAVIRUS, NAA: SARS-CoV-2, NAA: NOT DETECTED

## 2019-02-19 DIAGNOSIS — R29898 Other symptoms and signs involving the musculoskeletal system: Secondary | ICD-10-CM | POA: Diagnosis not present

## 2019-02-19 DIAGNOSIS — Z8673 Personal history of transient ischemic attack (TIA), and cerebral infarction without residual deficits: Secondary | ICD-10-CM | POA: Diagnosis not present

## 2019-02-19 DIAGNOSIS — M7501 Adhesive capsulitis of right shoulder: Secondary | ICD-10-CM | POA: Diagnosis not present

## 2019-03-05 ENCOUNTER — Other Ambulatory Visit: Payer: Self-pay

## 2019-03-05 ENCOUNTER — Ambulatory Visit: Payer: PPO | Attending: Orthopedic Surgery | Admitting: Occupational Therapy

## 2019-03-05 DIAGNOSIS — R278 Other lack of coordination: Secondary | ICD-10-CM | POA: Diagnosis not present

## 2019-03-05 DIAGNOSIS — M6281 Muscle weakness (generalized): Secondary | ICD-10-CM | POA: Diagnosis not present

## 2019-03-05 NOTE — Therapy (Signed)
[place] MAIN [facility] SERVICES [address] [place], [place], [address] Phone: [phone]   Fax:  [phone]  Occupational Therapy Evaluation  Patient Details  Name: Isaac Stevens MRN: [id number] Date of Birth: January 12, 1936 Referring Provider (OT): Dorise Hiss, [place]   Encounter Date: 03/05/2019  OT End of Session - 03/05/19 1540    Visit Number  1    Number of Visits  24    Date for OT Re-Evaluation  05/28/19    OT Start Time  1140    OT Stop Time  1240    OT Time Calculation (min)  60 min    Activity Tolerance  Patient tolerated treatment well    Behavior During Therapy  [facility] for tasks assessed/performed       Past Medical History:  Diagnosis Date  . CAFL (chronic airflow limitation) (HCC)   . CKD (chronic kidney disease)   . Diabetes mellitus without complication ([place])   . Hyperlipidemia   . Hypertension   . Rheumatoid arthritis [facility])     Past Surgical History:  Procedure Laterality Date  . TOE SURGERY     right foot    There were no vitals filed for this visit.  Subjective Assessment - 03/05/19 1534    Subjective   Pt. was present to the eval with his wife    Patient is accompanied by:  Family member    Pertinent History  Pt. is an 84 y.o. male who was initially admitted to [facility] for 24 hours following a CVA. Pt. was discharged, and one week later was admitted to [facility] with a CVA presenting with right sidedl weakness. Pt. received inpatient therapy services, and HHOT services upon discharge. Pt. is now ready for outpatient OT services.    Special Tests  FOTO score: 43    Patient Stated Goals  To regain the use of his right arm.    Currently in Pain?  No/denies        [facility] OT Assessment - 03/05/19 0001      Assessment   Medical Diagnosis  CVA    Referring Provider (OT)  Dorise Hiss, PA    Onset Date/Surgical Date  11/19/18    Hand Dominance  Right      Precautions   Precautions  None      Restrictions   Weight Bearing Restrictions  No      Balance Screen   Has the patient fallen in the past 6 months  No    Has the patient had a decrease in activity level because of a fear of falling?   No    Is the patient reluctant to leave their home because of a fear of falling?   No      Home  Environment   Family/patient expects to be discharged to:  Private residence    Living Arrangements  Spouse/significant other    Available Help at Discharge  Family    Type of Rule  One level    Child psychotherapist  Yes    How accessible  Accessible via [place] - 2 wheels;Cane - single point;Bedside commode;Shower seat;Grab bars - tub/shower;Hand held shower head;Wheelchair - manual      Prior Function   Level of Independence  Independent    Vocation  Retired    Leisure  old cars, wood shop      ADL   Eating/Feeding  Supervision/safety   Uses his nondominant left hand   Grooming  Minimal assistance    Upper Body Bathing  Moderate assistance    Lower Body Bathing  Moderate assistance    Upper Body Dressing  Minimal assistance    Lower Body Dressing  Moderate assistance    Toilet Transfer  Min guard    Tub/Shower Transfer  Supervision/safety      IADL   Prior Level of Function Shopping  Independent    Prior Level of Function Light Housekeeping  Independent    Light Housekeeping  Needs help with all home maintenance tasks    Meal Prep  Able to complete simple cold meal and snack prep    Prior Level of Function Medication Managment  Independent    Medication Management  Is not capable of dispensing or managing own medication    Prior Level of Function Financial Management  Independent    Financial Management  Dependent      Written Expression   Dominant Hand  Right      Vision - History   Baseline Vision  Wears glasses only for reading      Activity Tolerance   Activity Tolerance   Tolerates 30 min activity with multiple rests      Cognition   Overall Cognitive Status  Impaired/Different from baseline    Memory  Impaired   STM     Sensation   Light Touch  Appears Intact    Proprioception  Appears Intact      Coordination   Gross Motor Movements are Fluid and Coordinated  No    Fine Motor Movements are Fluid and Coordinated  No      Edema   Edema  Right hand, and digit edema      AROM   Overall AROM Comments  Right shoulder flexion results in 66 degrees of shoulder abduction,  Abduction 70, elbow flexion, and extension WFL, wirist extension -21, thumb opposition to the tip of the 2nd digit, limited digit flexion for composite fisting.      PROM   Overall PROM Comments  Right shoulder flexion 112, abduction: 94,       Strength   Overall Strength Comments  Left UE WFL      Hand Function   Right Hand Grip (lbs)  4#    Right Hand Lateral Pinch  3 lbs    Left Hand Grip (lbs)  48#    Left Hand Lateral Pinch  17 lbs                      OT Education - 03/05/19 1539    Education Details  OT services goals, POC    Person(s) Educated  Patient    Methods  Explanation;Demonstration    Comprehension  Verbalized understanding;Returned demonstration          OT Long Term Goals - 03/05/19 1548      OT LONG TERM GOAL #1   Title  Pt. will increase Right shoulder AROM  in order to be able to reach into a cabinet.    Baseline  Eval: 0 degrees of isolated flexion (moves into 66 degrees of abduction with attempts for flexion)    Time  12    Period  Weeks    Status  New    Target Date  05/28/19      OT LONG TERM GOAL #  2   Title  Pt. will increase right shoulder abduction to be able to independently wash his hair.    Baseline  Eval: Pt. is unable to use his Right hand to wash his hair. Abduction: 70 degrees.    Time  12    Period  Weeks    Status  New    Target Date  05/28/19      OT LONG TERM GOAL #3   Title  Pt. will perform self-feeding  with his right hand with minA.    Baseline  Eval: Pt. is unable to use his right hand for self-feeding.    Time  12    Period  Weeks    Status  New    Target Date  05/28/19      OT LONG TERM GOAL #4   Title  Pt. will increase right wrist extension by 10 degrees in preparation for reaching for a glass.    Baseline  Eval: -21 degrees. Pt. is unable.    Time  12    Period  Weeks    Status  New    Target Date  05/28/19      OT LONG TERM GOAL #5   Title  Pt. will independently use his right hand to wash his face.    Baseline  Eval: Pt. is unable to perform the task with his right hand.    Time  12    Period  Weeks    Status  New    Target Date  05/28/19      Long Term Additional Goals   Additional Long Term Goals  Yes      OT LONG TERM GOAL #6   Title  Pt. will be indpendent with UE dressing    Baseline  Eval: Pt. requires modA    Time  12    Period  Weeks    Status  New    Target Date  05/28/19      OT LONG TERM GOAL #7   Title  Pt. will require minA LE dressing    Baseline  Eval: Pt. requires modA    Time  12    Period  Weeks    Status  New    Target Date  05/28/19            Plan - 03/05/19 1541    Clinical Impression Statement  Pt. is an 84 y.o. male who was diagnosed with a CVA with right sided weakness. Pt. presents with limited RUE ROM, decreased strength, impaired motor control, and [facility] skills which makes it difficult to engage his dominant RUE, and hand in any ADL, and IADL tasks. Pt.'s FOTO score is: 49. Pt.will benefit from OT skilled services to work on improving his RUE ROM strength, motor control, and [facility] skills in order to increase engagement during ADLs, and IADLs, and improve RUE functioning during ADLs, and IADL tasks.    Occupational performance deficits (Please refer to evaluation for details):  ADL's;IADL's;Education    Rehab Potential  Good    Clinical Decision Making  Several treatment options, min-mod task modification necessary     Comorbidities Affecting Occupational Performance:  May have comorbidities impacting occupational performance    Modification or Assistance to Complete Evaluation   Min-Moderate modification of tasks or assist with assess necessary to complete eval    OT Frequency  2x / week    OT Duration  12 weeks    Consulted and Agree with Plan of  Care  Patient;Family member/caregiver    Family Member Consulted  wife       Patient will benefit from skilled therapeutic intervention in order to improve the following deficits and impairments:           Visit Diagnosis: Muscle weakness (generalized)  Other lack of coordination    Problem List Patient Active Problem List   Diagnosis Date Noted  . Stroke ([place]) 11/19/2018  . Syncope 10/21/2015  . Drug-induced pneumonitis 04/02/2015  . COPD (chronic obstructive pulmonary disease) ([place]) 04/02/2015  . Dyspnea 04/02/2015  . COPD exacerbation ([place]) 03/11/2015  . CAFL (chronic airflow limitation) ([place]) 08/14/2014  . HLD (hyperlipidemia) 08/14/2014  . BP (high blood pressure) 08/14/2014  . Diabetes mellitus type 2, uncontrolled ([place]) 08/14/2014  . Chronic kidney disease (CKD), stage III (moderate) 01/27/2014  . 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

## 2019-03-10 ENCOUNTER — Other Ambulatory Visit: Payer: Self-pay

## 2019-03-10 ENCOUNTER — Encounter: Payer: Self-pay | Admitting: Occupational Therapy

## 2019-03-10 ENCOUNTER — Ambulatory Visit: Payer: PPO | Attending: Orthopedic Surgery | Admitting: Occupational Therapy

## 2019-03-10 DIAGNOSIS — R278 Other lack of coordination: Secondary | ICD-10-CM | POA: Diagnosis not present

## 2019-03-10 DIAGNOSIS — R2681 Unsteadiness on feet: Secondary | ICD-10-CM | POA: Insufficient documentation

## 2019-03-10 DIAGNOSIS — M6281 Muscle weakness (generalized): Secondary | ICD-10-CM | POA: Insufficient documentation

## 2019-03-10 NOTE — Therapy (Signed)
[place] MAIN [facility] SERVICES [address] [place], [place], [address] Phone: [phone]   Fax:  [phone]  Occupational Therapy Treatment  Patient Details  Name: Isaac Stevens MRN: [id number] Date of Birth: Jun 10, 1935 Referring Provider (OT): Dorise Hiss, [place]   Encounter Date: 03/10/2019  OT End of Session - 03/10/19 1326    Visit Number  2    Number of Visits  24    Date for OT Re-Evaluation  05/28/19    Authorization Type  Progress report period starting 02/20/2019    OT Start Time  1018    OT Stop Time  1100    OT Time Calculation (min)  42 min    Activity Tolerance  Patient tolerated treatment well    Behavior During Therapy  [facility] for tasks assessed/performed       Past Medical History:  Diagnosis Date  . CAFL (chronic airflow limitation) (HCC)   . CKD (chronic kidney disease)   . Diabetes mellitus without complication ([place])   . Hyperlipidemia   . Hypertension   . Rheumatoid arthritis [facility])     Past Surgical History:  Procedure Laterality Date  . TOE SURGERY     right foot    There were no vitals filed for this visit.  Subjective Assessment - 03/10/19 1325    Subjective   Pt. reports that he is doing well today.    Patient is accompanied by:  Family member    Pertinent History  Pt. is an 84 y.o. male who was initially admitted to [facility] for 24 hours following a CVA. Pt. was discharged, and one week later was admitted to [facility] with a CVA presenting with right sidedl weakness. Pt. received inpatient therapy services, and HHOT services upon discharge. Pt. is now ready for outpatient OT services.    Special Tests  FOTO score: 43    Patient Stated Goals  To regain the use of his right arm.    Currently in Pain?  No/denies      OT TREATMENT    Neuro muscular re-education:  Pt. Worked on gentle weightbearing, and proprioceptive input through the RUE forearm while seated at the mat to normalize tone, and prepare the UE for  ROM  Therapeutic Exercise:  Pt. Performed shoulder stabilization exercises in supine. Pt. as unable to hold the RUE position with the shoulder flexed to 90 degrees, and the elbow extended. Pt. Performed PROM for the right shoulder, and AAROM, and AROM for the right elbow, flexion, extension, wrist extension, and digit flexion, extension, thumb abduction, and adduction  Manual Therapy:  Pt. tolerated scapular mobilizations in elevation, depression, and rotation in preparation for ROM. Pt. Tolerated retograde massage for edema control to the right hand with the RUE elevated. Pt. Tolerated soft tissue mobilizations for carpal rolls, and metacarpal spread stretches to prepare the right hand for ROM. Manual techniques were performed independent of, and in preparation for ROM.  Response to Treatment:  Pt. continues to present with limited right shoulder AROM in sitting, and supine against gravity. Pt. responded well to retrograde massage, and soft tissue mobilizations to the right hand. Pt. continues to work on improving RUE functioning in order to work towards increasing engagement of the UE during ADL, and IADL tasks.                       OT Education - 03/10/19 1325    Education Details  RUE functioning  Person(s) Educated  Patient    Methods  Explanation;Demonstration    Comprehension  Verbalized understanding;Returned demonstration          OT Long Term Goals - 03/05/19 1548      OT LONG TERM GOAL #1   Title  Pt. will increase Right shoulder AROM  in order to be able to reach into a cabinet.    Baseline  Eval: 0 degrees of isolated flexion (moves into 66 degrees of abduction with attempts for flexion)    Time  12    Period  Weeks    Status  New    Target Date  05/28/19      OT LONG TERM GOAL #2   Title  Pt. will increase right shoulder abduction to be able to independently wash his hair.    Baseline  Eval: Pt. is unable to use his Right hand to wash his  hair. Abduction: 70 degrees.    Time  12    Period  Weeks    Status  New    Target Date  05/28/19      OT LONG TERM GOAL #3   Title  Pt. will perform self-feeding with his right hand with minA.    Baseline  Eval: Pt. is unable to use his right hand for self-feeding.    Time  12    Period  Weeks    Status  New    Target Date  05/28/19      OT LONG TERM GOAL #4   Title  Pt. will increase right wrist extension by 10 degrees in preparation for reaching for a glass.    Baseline  Eval: -21 degrees. Pt. is unable.    Time  12    Period  Weeks    Status  New    Target Date  05/28/19      OT LONG TERM GOAL #5   Title  Pt. will independently use his right hand to wash his face.    Baseline  Eval: Pt. is unable to perform the task with his right hand.    Time  12    Period  Weeks    Status  New    Target Date  05/28/19      Long Term Additional Goals   Additional Long Term Goals  Yes      OT LONG TERM GOAL #6   Title  Pt. will be indpendent with UE dressing    Baseline  Eval: Pt. requires modA    Time  12    Period  Weeks    Status  New    Target Date  05/28/19      OT LONG TERM GOAL #7   Title  Pt. will require minA LE dressing    Baseline  Eval: Pt. requires modA    Time  12    Period  Weeks    Status  New    Target Date  05/28/19            Plan - 03/10/19 1327    Clinical Impression Statement Pt. continues to present with limited right shoulder AROM in sitting, and supine against gravity. Pt. responded well to retrograde massage, and soft tissue mobilizations to the right hand. Pt. continues to work on improving RUE functioning in order to work towards increasing engagement of the UE during ADL, and IADL tasks.    Occupational performance deficits (Please refer to evaluation for details):  ADL's;IADL's;Education  Rehab Potential  Good    Clinical Decision Making  Several treatment options, min-mod task modification necessary    Comorbidities Affecting  Occupational Performance:  May have comorbidities impacting occupational performance    Modification or Assistance to Complete Evaluation   Min-Moderate modification of tasks or assist with assess necessary to complete eval    OT Frequency  2x / week    OT Duration  12 weeks    Consulted and Agree with Plan of Care  Patient;Family member/caregiver    Family Member Consulted  wife       Patient will benefit from skilled therapeutic intervention in order to improve the following deficits and impairments:           Visit Diagnosis: Muscle weakness (generalized)  Other lack of coordination    Problem List Patient Active Problem List   Diagnosis Date Noted  . Stroke ([place]) 11/19/2018  . Syncope 10/21/2015  . Drug-induced pneumonitis 04/02/2015  . COPD (chronic obstructive pulmonary disease) ([place]) 04/02/2015  . Dyspnea 04/02/2015  . COPD exacerbation ([place]) 03/11/2015  . CAFL (chronic airflow limitation) ([place]) 08/14/2014  . HLD (hyperlipidemia) 08/14/2014  . BP (high blood pressure) 08/14/2014  . Diabetes mellitus type 2, uncontrolled ([place]) 08/14/2014  . Chronic kidney disease (CKD), stage III (moderate) 01/27/2014  . 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 [place], [place], [address] Phone: [phone]   Fax:  [phone]  Name: Isaac Stevens MRN: [id number] Date of Birth: Jul 16, 1935

## 2019-03-11 ENCOUNTER — Ambulatory Visit: Payer: PPO | Admitting: Occupational Therapy

## 2019-03-11 DIAGNOSIS — M1A00X0 Idiopathic chronic gout, unspecified site, without tophus (tophi): Secondary | ICD-10-CM | POA: Diagnosis not present

## 2019-03-11 DIAGNOSIS — M1990 Unspecified osteoarthritis, unspecified site: Secondary | ICD-10-CM | POA: Diagnosis not present

## 2019-03-13 ENCOUNTER — Other Ambulatory Visit: Payer: Self-pay

## 2019-03-13 ENCOUNTER — Encounter: Payer: Self-pay | Admitting: Occupational Therapy

## 2019-03-13 ENCOUNTER — Ambulatory Visit: Payer: PPO | Admitting: Occupational Therapy

## 2019-03-13 DIAGNOSIS — M6281 Muscle weakness (generalized): Secondary | ICD-10-CM | POA: Diagnosis not present

## 2019-03-13 DIAGNOSIS — R278 Other lack of coordination: Secondary | ICD-10-CM

## 2019-03-13 NOTE — Therapy (Signed)
[place] MAIN [facility] SERVICES [address] [place], [place], [address] Phone: [phone]   Fax:  [phone]  Occupational Therapy Treatment  Patient Details  Name: Isaac Stevens MRN: [id number] Date of Birth: 27-Mar-1935 Referring Provider (OT): Dorise Hiss, [place]   Encounter Date: 03/13/2019  OT End of Session - 03/13/19 1439    Visit Number  3    Number of Visits  24    Date for OT Re-Evaluation  05/28/19    Authorization Type  Progress report period starting 02/20/2019    OT Start Time  1202    OT Stop Time  1230    OT Time Calculation (min)  28 min    Activity Tolerance  Patient tolerated treatment well    Behavior During Therapy  [facility] for tasks assessed/performed       Past Medical History:  Diagnosis Date  . CAFL (chronic airflow limitation) (HCC)   . CKD (chronic kidney disease)   . Diabetes mellitus without complication ([place])   . Hyperlipidemia   . Hypertension   . Rheumatoid arthritis [facility])     Past Surgical History:  Procedure Laterality Date  . TOE SURGERY     right foot    There were no vitals filed for this visit.  Subjective Assessment - 03/13/19 1438    Subjective   Pt. reports that he is doing well today.    Patient is accompanied by:  Family member    Pertinent History  Pt. is an 84 y.o. male who was initially admitted to [facility] for 24 hours following a CVA. Pt. was discharged, and one week later was admitted to [facility] with a CVA presenting with right sidedl weakness. Pt. received inpatient therapy services, and HHOT services upon discharge. Pt. is now ready for outpatient OT services.    Patient Stated Goals  To regain the use of his right arm.    Currently in Pain?  No/denies       OT TREATMENT    Therapeutic Exercise:  Pt. performed PROM for the right shoulder, and AAROM, and AROM for the right elbow, flexion, extension, wrist extension, and digit flexion, extension, thumb abduction, and adduction. Pt.  Requires cues to maintain his trunk in midline, and avoid hiking his right shoulder during ROM. ROM was performed in preparation for functional reaching, grasping, and releasing.  Manual Therapy:  Pt. tolerated scapular mobilizations in elevation, depression, and rotation in preparation for ROM. Pt. Tolerated retograde massage for edema control to the right hand with the RUE elevated. Pt. Tolerated soft tissue mobilizations for carpal rolls, and metacarpal spread stretches to prepare the right hand for ROM. Manual techniques were performed independent of, and in preparation for ROM.  Response to Treatment:  Pt. was late for his treatment session. Pt continues to present with limited right shoulder AROM, however has improved with shoulder abduction, and flexion. Pt. present with more active digit flexion, extension, and thumb abduction. Pt. continues to work on improving Ue functioning in order to work towards improving, and maximizing independence with ADLs, and IADLs.                         OT Education - 03/13/19 1439    Education Details  RUE functioning    Person(s) Educated  Patient    Methods  Explanation;Demonstration    Comprehension  Verbalized understanding;Returned demonstration          OT Long Term Goals -  03/05/19 1548      OT LONG TERM GOAL #1   Title  Pt. will increase Right shoulder AROM  in order to be able to reach into a cabinet.    Baseline  Eval: 0 degrees of isolated flexion (moves into 66 degrees of abduction with attempts for flexion)    Time  12    Period  Weeks    Status  New    Target Date  05/28/19      OT LONG TERM GOAL #2   Title  Pt. will increase right shoulder abduction to be able to independently wash his hair.    Baseline  Eval: Pt. is unable to use his Right hand to wash his hair. Abduction: 70 degrees.    Time  12    Period  Weeks    Status  New    Target Date  05/28/19      OT LONG TERM GOAL #3   Title  Pt. will  perform self-feeding with his right hand with minA.    Baseline  Eval: Pt. is unable to use his right hand for self-feeding.    Time  12    Period  Weeks    Status  New    Target Date  05/28/19      OT LONG TERM GOAL #4   Title  Pt. will increase right wrist extension by 10 degrees in preparation for reaching for a glass.    Baseline  Eval: -21 degrees. Pt. is unable.    Time  12    Period  Weeks    Status  New    Target Date  05/28/19      OT LONG TERM GOAL #5   Title  Pt. will independently use his right hand to wash his face.    Baseline  Eval: Pt. is unable to perform the task with his right hand.    Time  12    Period  Weeks    Status  New    Target Date  05/28/19      Long Term Additional Goals   Additional Long Term Goals  Yes      OT LONG TERM GOAL #6   Title  Pt. will be indpendent with UE dressing    Baseline  Eval: Pt. requires modA    Time  12    Period  Weeks    Status  New    Target Date  05/28/19      OT LONG TERM GOAL #7   Title  Pt. will require minA LE dressing    Baseline  Eval: Pt. requires modA    Time  12    Period  Weeks    Status  New    Target Date  05/28/19            Plan - 03/13/19 1439    Clinical Impression Statement Pt. was late for his treatment session. Pt continues to present with limited right shoulder AROM, however has improved with shoulder abduction, and flexion. Pt. present with more active digit flexion, extension, and thumb abduction. Pt. continues to work on improving Ue functioning in order to work towards improving, and maximizing independence with ADLs, and IADLs.    Occupational performance deficits (Please refer to evaluation for details):  ADL's;IADL's;Education    Rehab Potential  Good    Clinical Decision Making  Several treatment options, min-mod task modification necessary    Comorbidities Affecting Occupational Performance:  May have  comorbidities impacting occupational performance    Modification or  Assistance to Complete Evaluation   Min-Moderate modification of tasks or assist with assess necessary to complete eval    OT Frequency  2x / week    OT Duration  12 weeks    Consulted and Agree with Plan of Care  Patient;Family member/caregiver    Family Member Consulted  wife       Patient will benefit from skilled therapeutic intervention in order to improve the following deficits and impairments:           Visit Diagnosis: Muscle weakness (generalized)  Other lack of coordination    Problem List Patient Active Problem List   Diagnosis Date Noted  . Stroke ([place]) 11/19/2018  . Syncope 10/21/2015  . Drug-induced pneumonitis 04/02/2015  . COPD (chronic obstructive pulmonary disease) ([place]) 04/02/2015  . Dyspnea 04/02/2015  . COPD exacerbation ([place]) 03/11/2015  . CAFL (chronic airflow limitation) ([place]) 08/14/2014  . HLD (hyperlipidemia) 08/14/2014  . BP (high blood pressure) 08/14/2014  . Diabetes mellitus type 2, uncontrolled ([place]) 08/14/2014  . Chronic kidney disease (CKD), stage III (moderate) 01/27/2014  . 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 [place], [place], [address] Phone: [phone]   Fax:  [phone]  Name: DORLAN BRAMLETTE MRN: [id number] Date of Birth: 12-Mar-1935

## 2019-03-17 ENCOUNTER — Encounter: Payer: Self-pay | Admitting: Occupational Therapy

## 2019-03-17 ENCOUNTER — Ambulatory Visit: Payer: PPO | Admitting: Physical Therapy

## 2019-03-17 ENCOUNTER — Ambulatory Visit: Payer: PPO | Admitting: Occupational Therapy

## 2019-03-17 ENCOUNTER — Other Ambulatory Visit: Payer: Self-pay

## 2019-03-17 ENCOUNTER — Encounter: Payer: Self-pay | Admitting: Physical Therapy

## 2019-03-17 DIAGNOSIS — M1A00X0 Idiopathic chronic gout, unspecified site, without tophus (tophi): Secondary | ICD-10-CM | POA: Diagnosis not present

## 2019-03-17 DIAGNOSIS — M0579 Rheumatoid arthritis with rheumatoid factor of multiple sites without organ or systems involvement: Secondary | ICD-10-CM | POA: Diagnosis not present

## 2019-03-17 DIAGNOSIS — M6281 Muscle weakness (generalized): Secondary | ICD-10-CM

## 2019-03-17 DIAGNOSIS — M1990 Unspecified osteoarthritis, unspecified site: Secondary | ICD-10-CM | POA: Diagnosis not present

## 2019-03-17 DIAGNOSIS — R2681 Unsteadiness on feet: Secondary | ICD-10-CM

## 2019-03-17 DIAGNOSIS — I639 Cerebral infarction, unspecified: Secondary | ICD-10-CM | POA: Diagnosis not present

## 2019-03-17 NOTE — Therapy (Signed)
[place] MAIN [facility] SERVICES [address] [place], [place], [address] Phone: [phone]   Fax:  [phone]  Occupational Therapy Treatment  Patient Details  Name: Isaac Stevens MRN: [id number] Date of Birth: Apr 16, 1935 Referring Provider (OT): Dorise Hiss, [place]   Encounter Date: 03/17/2019  OT End of Session - 03/17/19 1442    Visit Number  4    Number of Visits  24    Date for OT Re-Evaluation  05/28/19    Authorization Type  Progress report period starting 02/20/2019    OT Start Time  1100    OT Stop Time  1145    OT Time Calculation (min)  45 min    Activity Tolerance  Patient tolerated treatment well    Behavior During Therapy  [facility] for tasks assessed/performed       Past Medical History:  Diagnosis Date  . CAFL (chronic airflow limitation) (HCC)   . CKD (chronic kidney disease)   . Diabetes mellitus without complication ([place])   . Hyperlipidemia   . Hypertension   . Rheumatoid arthritis [facility])     Past Surgical History:  Procedure Laterality Date  . TOE SURGERY     right foot    There were no vitals filed for this visit.  Subjective Assessment - 03/17/19 1440    Subjective   Pt. reports that he is doing well today.    Patient is accompanied by:  Family member    Pertinent History  Pt. is an 84 y.o. male who was initially admitted to [facility] for 24 hours following a CVA. Pt. was discharged, and one week later was admitted to [facility] with a CVA presenting with right sidedl weakness. Pt. received inpatient therapy services, and HHOT services upon discharge. Pt. is now ready for outpatient OT services.    Currently in Pain?  No/denies      OT TREATMENT    Neuro muscular re-education:  Pt. worked on gentle weightbearing, and proprioceptive input through the RUE forearm while seated at the mat to normalize tone, and prepare the UE for ROM.  Therapeutic Exercise:  Pt. performed shoulder stabilization exercises in supine.  Pt. is unable to hold the RUE position with the shoulder flexed to 90 degrees, and the elbow extended. Pt. Performed PROM for the right shoulder, and AAROM using the UE Ranger, Pt. performed AROM for the right elbow, flexion, extension, wrist extension, and digit flexion, extension, thumb abduction, and adduction.  Manual Therapy:  Pt. tolerated scapular mobilizations in elevation, depression, and rotation in preparation for ROM. Pt. Tolerated retograde massage for edema control to the right hand with the RUE elevated. Pt. Tolerated soft tissue mobilizations for carpal rolls, and metacarpal spread stretches to prepare the right hand for ROM. Manual techniques were performed independent of, and in preparation for therapeutic Ex.   Response to Treatment:  Pt. is making proress overall, and is attempting to use his right hand more during tasks at home. Pt.'s edema is improving in his right hand, pt. is improving with right digit flexion, extension, grip, and pinch strength. Pt. continues to present with limited right shoulder AROM, strength, and stabilization. Pt. continues to work on improving RUE functioning in order to increase RUE engagement during ADLs, and IADL tasks.                        OT Education - 03/17/19 1441    Education Details  RUE functioning  Person(s) Educated  Patient    Methods  Explanation;Demonstration    Comprehension  Verbalized understanding;Returned demonstration          OT Long Term Goals - 03/05/19 1548      OT LONG TERM GOAL #1   Title  Pt. will increase Right shoulder AROM  in order to be able to reach into a cabinet.    Baseline  Eval: 0 degrees of isolated flexion (moves into 66 degrees of abduction with attempts for flexion)    Time  12    Period  Weeks    Status  New    Target Date  05/28/19      OT LONG TERM GOAL #2   Title  Pt. will increase right shoulder abduction to be able to independently wash his hair.    Baseline   Eval: Pt. is unable to use his Right hand to wash his hair. Abduction: 70 degrees.    Time  12    Period  Weeks    Status  New    Target Date  05/28/19      OT LONG TERM GOAL #3   Title  Pt. will perform self-feeding with his right hand with minA.    Baseline  Eval: Pt. is unable to use his right hand for self-feeding.    Time  12    Period  Weeks    Status  New    Target Date  05/28/19      OT LONG TERM GOAL #4   Title  Pt. will increase right wrist extension by 10 degrees in preparation for reaching for a glass.    Baseline  Eval: -21 degrees. Pt. is unable.    Time  12    Period  Weeks    Status  New    Target Date  05/28/19      OT LONG TERM GOAL #5   Title  Pt. will independently use his right hand to wash his face.    Baseline  Eval: Pt. is unable to perform the task with his right hand.    Time  12    Period  Weeks    Status  New    Target Date  05/28/19      Long Term Additional Goals   Additional Long Term Goals  Yes      OT LONG TERM GOAL #6   Title  Pt. will be indpendent with UE dressing    Baseline  Eval: Pt. requires modA    Time  12    Period  Weeks    Status  New    Target Date  05/28/19      OT LONG TERM GOAL #7   Title  Pt. will require minA LE dressing    Baseline  Eval: Pt. requires modA    Time  12    Period  Weeks    Status  New    Target Date  05/28/19            Plan - 03/17/19 1443    Clinical Impression Statement Pt. is making proress overall, and is attempting to use his right hand more during tasks at home. Pt.'s edema is improving in his right hand, pt. is improving with right digit flexion, extension, grip, and pinch strength. Pt. continues to present with limited right shoulder AROM, strength, and stabilization. Pt. continues to work on improving RUE functioning in order to increase RUE engagement during ADLs, and IADL tasks.  Occupational performance deficits (Please refer to evaluation for details):  ADL's;IADL's;Education     Rehab Potential  Good    Clinical Decision Making  Several treatment options, min-mod task modification necessary    Comorbidities Affecting Occupational Performance:  May have comorbidities impacting occupational performance    Modification or Assistance to Complete Evaluation   Min-Moderate modification of tasks or assist with assess necessary to complete eval    OT Frequency  2x / week    OT Duration  12 weeks    OT Treatment/Interventions  Self-care/ADL training;Patient/family education;Therapeutic activities;DME and/or AE instruction;Neuromuscular education;Therapeutic exercise    Consulted and Agree with Plan of Care  Patient;Family member/caregiver    Family Member Consulted  wife       Patient will benefit from skilled therapeutic intervention in order to improve the following deficits and impairments:           Visit Diagnosis: Muscle weakness (generalized)    Problem List Patient Active Problem List   Diagnosis Date Noted  . Stroke ([place]) 11/19/2018  . Syncope 10/21/2015  . Drug-induced pneumonitis 04/02/2015  . COPD (chronic obstructive pulmonary disease) ([place]) 04/02/2015  . Dyspnea 04/02/2015  . COPD exacerbation ([place]) 03/11/2015  . CAFL (chronic airflow limitation) ([place]) 08/14/2014  . HLD (hyperlipidemia) 08/14/2014  . BP (high blood pressure) 08/14/2014  . Diabetes mellitus type 2, uncontrolled ([place]) 08/14/2014  . Chronic kidney disease (CKD), stage III (moderate) 01/27/2014  . 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 [place], [place], [address] Phone: [phone]   Fax:  [phone]  Name: THEODRIC GARBO MRN: [id number] Date of Birth: 12/29/1935

## 2019-03-17 NOTE — Therapy (Signed)
[place] MAIN [facility] SERVICES [address] [place], [place], [address] Phone: [phone]   Fax:  [phone]  Physical Therapy Evaluation  Patient Details  Name: Isaac Stevens MRN: [id number] Date of Birth: [birth year] Referring Provider (PT): Rachelle Hora, MD   Encounter Date: 03/17/2019  PT End of Session - 03/17/19 1236    Visit Number  1    Number of Visits  17    Date for PT Re-Evaluation  05/12/19    PT Start Time  [id number]    PT Stop Time  1230    PT Time Calculation (min)  45 min    Equipment Utilized During Treatment  Gait belt    Activity Tolerance  Patient tolerated treatment well    Behavior During Therapy  [facility] for tasks assessed/performed       Past Medical History:  Diagnosis Date  . CAFL (chronic airflow limitation) (HCC)   . CKD (chronic kidney disease)   . Diabetes mellitus without complication ([place])   . Hyperlipidemia   . Hypertension   . Rheumatoid arthritis [facility])     Past Surgical History:  Procedure Laterality Date  . TOE SURGERY     right foot    There were no vitals filed for this visit.   Subjective Assessment - 03/17/19 1147    Subjective  "I have weakness on my right side and I do worry about falling."    Pertinent History  Pt. is an 84 y.o. male who was initially admitted to [facility] for 24 hours following a CVA on 11/19/18. Pt. was discharged, and one week later was admitted to [facility] with a CVA presenting with right sided weakness. Pt. received inpatient therapy services, and [facility] rehab services upon discharge. He is now being referred for outpatient rehab. Patient has already started outpatient OT to address RUE weakness and ADLs. 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 He has also been referred to orthopedics in January 2021 due to RUE frozen shoulder/weakness and pain. He presents to therapy with tripod base cane which he reports using intermittently to reduce fall risk. He denies any recent  falls. He denies any numbness/tingling    Limitations  Standing;Walking    How long can you sit comfortably?  NA    How long can you stand comfortably?  5-10 min, with fatigue    How long can you walk comfortably?  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  No/denies    Multiple Pain Sites  No         [facility] PT Assessment - 03/17/19 0001      Assessment   Medical Diagnosis  CVA    Referring Provider (PT)  Rachelle Hora, MD    Onset Date/Surgical Date  11/19/18    Hand Dominance  Right    Next MD Visit  unsure    Prior Therapy  had inpatient and home health PT following stroke      Precautions   Precautions  Fall    Required Braces or Orthoses  --   none     Restrictions   Weight Bearing Restrictions  No      Balance Screen   Has the patient fallen in the past 6 months  No    Has  the patient had a decrease in activity level because of a fear of falling?   Yes    Is the patient reluctant to leave their home because of a fear of falling?   No      Home Environment   Additional Comments  Lives in single story home, does have a basement, living on main floor; has ramped entrance; has tub shower and walk in shower; does require assistance for shower, dressing and other ADLs. He reports being mod I for bed mobility and transfers;       Prior Function   Level of Independence  Independent    Vocation  Retired    Leisure  old cars, Leisure centre manager  Impaired   STM     Observation/Other Assessments   Focus on Therapeutic Outcomes (FOTO)   43/100 (on 03/05/19)      Sensation   Light Touch  Appears Intact    Proprioception  Appears Intact      Coordination   Gross Motor Movements are Fluid and Coordinated  Yes    Heel Shin Test  accurate bilaterally;      Posture/Postural Control   Posture Comments  WFL,  sits with RUE resting in lap      AROM   Overall AROM Comments  BLE are [facility]      Strength   Right Hip Flexion  4-/5    Right Hip ABduction  3-/5    Left Hip Flexion  4+/5    Left Hip ABduction  4/5    Right Knee Flexion  4-/5    Right Knee Extension  4-/5    Left Knee Flexion  5/5    Left Knee Extension  5/5    Right Ankle Dorsiflexion  4-/5    Left Ankle Dorsiflexion  5/5      Bed Mobility   Bed Mobility  --   requires min A for some bed mobility including supine to sit     Transfers   Comments  able to transfer sit<>Stand from regular height chair without pushing on arm rests but exhibits increased lean to left side due to weakness      Ambulation/Gait   Gait Comments  pt ambulates with tripod base cane, exhibits increased unsteadiness requiring CGA for most ambulation. Patient can be impulsive. Exhibits slower gait speed, narrow base of support and increased sway during walking; requires CGA for safety      6 Minute Walk- Baseline   6 Minute Walk- Baseline  yes    BP (mmHg)  127/72    HR (bpm)  83    02 Sat (%RA)  99 %      6 Minute walk- Post Test   6 Minute Walk Post Test  yes    BP (mmHg)  139/69    HR (bpm)  91    02 Sat (%RA)  99 %      6 minute walk test results    Aerobic Endurance Distance Walked  610    Endurance additional comments  with tripod base cane, min A for safety, less than 1000 feet is limited community ambulator      Standardized Balance Assessment   Five times sit to stand comments   15.79 sec with arms across chest (>15 sec indicate high fall risk)      High Level Balance   High Level Balance Comments  Standing with feet together: eyes open: no sway,  eyes closed, no sway; able to hold modified tandem stance with CGA, SLS for 2-3 sec with instability noted                Objective measurements completed on examination: See above findings.              PT Education - 03/17/19 1236    Education Details  plan of care     Person(s) Educated  Patient;Spouse    Methods  Explanation    Comprehension  Verbalized understanding       PT Short Term Goals - 03/17/19 1245      PT SHORT TERM GOAL #1   Title  Patient will be adherent to HEP at least 3x a week to improve functional strength and balance for better safety at home.    Time  4    Period  Weeks    Status  New    Target Date  04/14/19      PT SHORT TERM GOAL #2   Title  Patient will increase BLE gross strength to 4/5 particularly in right hip as to improve functional strength for independent gait, increased standing tolerance and increased ADL ability    Time  4    Period  Weeks    Status  New    Target Date  04/14/19      PT SHORT TERM GOAL #3   Title  Patient will be mod I for all bed mobility including rolling and supine to sit without difficulty to increase independence at home.    Time  4    Period  Weeks    Status  New    Target Date  04/14/19        PT Long Term Goals - 03/17/19 1248      PT LONG TERM GOAL #1   Title  Patient (> 13 years old) will complete five times sit to stand test in < 15 seconds indicating an increased LE strength and improved balance.    Time  8    Period  Weeks    Status  New    Target Date  05/12/19      PT LONG TERM GOAL #2   Title  Patient will demonstrate an improved Berg Balance Score of >48/56 as to demonstrate improved balance with ADLs such as sitting/standing and transfer balance and reduced fall risk.    Time  8    Period  Weeks    Status  New    Target Date  05/12/19      PT LONG TERM GOAL #3   Title  Patient will increase six minute walk test distance to >1000 for progression to community ambulator and improve gait ability    Time  8    Period  Weeks    Status  New    Target Date  05/12/19      PT LONG TERM GOAL #4   Title  Patient will tolerate 5 seconds of single leg stance without loss of balance to improve ability to get in and out of shower safely.    Time  8    Period  Weeks     Status  New    Target Date  05/12/19             Plan - 03/17/19 1153    Clinical Impression Statement  84 yo Male presents to therapy after CVA on 11/19/18 with Right sided weakness. Patient exhibits increased weakness on RLE  particularly in hip. He had difficulty with bed mboility requiring min A for rolling and supine to sitting. Patient is able to transfer sit<>Stand without pushing on arm rests but exhibits increased lean to LLE due to RLE weakness. He is able to ambulate with tripod base cane requiring CGA for safety. Patient can be impulsive and exhibits increased unsteadiness during ambulation with occasional veering side/side. He also ambulates with narrow base of support. Patient did test as a high fall risk. He would benefit from skilled PT intervention to improve strength, balance and gait safety;    Personal Factors and Comorbidities  Comorbidity 3+;Age;Time since onset of injury/illness/exacerbation    Comorbidities  recent stroke, DM x2 (on medication), HTN (somewhat controlled), CKD, HLD, RA (worse in right hand)    Examination-Activity Limitations  Bed Mobility;Caring for Others;Carry;Dressing;Lift;Locomotion Level;Reach Overhead;Squat;Stairs;Stand;Transfers    Examination-Participation Restrictions  Church;Cleaning;Community Activity;Driving;Laundry;Meal Prep;School;Shop;Volunteer;Yard Work    Stability/Clinical Decision Making  Stable/Uncomplicated    Clinical Decision Making  Low    Rehab Potential  Good    PT Frequency  2x / week    PT Duration  8 weeks    PT Treatment/Interventions  ADLs/Self Care Home Management;Cryotherapy;Electrical Stimulation;Moist Heat;Gait training;Functional mobility Scientist, forensic;Therapeutic activities;Therapeutic exercise;Balance training;Neuromuscular re-education;Patient/family education;Orthotic Fit/Training;Energy conservation    PT Next Visit Plan  initiate HEP    PT Home Exercise Plan  will address next session    Consulted and  Agree with Plan of Care  Patient;Family member/caregiver    Family Member Consulted  spouse       Patient will benefit from skilled therapeutic intervention in order to improve the following deficits and impairments:  Abnormal gait, Decreased balance, Decreased endurance, Decreased mobility, Difficulty walking, Impaired perceived functional ability, Cardiopulmonary status limiting activity, Decreased activity tolerance, Decreased safety awareness, Decreased strength  Visit Diagnosis: Muscle weakness (generalized)  Unsteadiness on feet     Problem List Patient Active Problem List   Diagnosis Date Noted  . Stroke ([place]) 11/19/2018  . Syncope 10/21/2015  . Drug-induced pneumonitis 04/02/2015  . COPD (chronic obstructive pulmonary disease) ([place]) 04/02/2015  . Dyspnea 04/02/2015  . COPD exacerbation ([place]) 03/11/2015  . CAFL (chronic airflow limitation) ([place]) 08/14/2014  . HLD (hyperlipidemia) 08/14/2014  . BP (high blood pressure) 08/14/2014  . Diabetes mellitus type 2, uncontrolled ([place]) 08/14/2014  . Chronic kidney disease (CKD), stage III (moderate) 01/27/2014  . 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 [place], [place], [address] Phone: [phone]   Fax:  [phone]  Name: ORBIN BEITEL MRN: [id number] Date of Birth: 06-06-1935

## 2019-03-20 ENCOUNTER — Other Ambulatory Visit: Payer: Self-pay

## 2019-03-20 ENCOUNTER — Ambulatory Visit: Payer: PPO | Admitting: Physical Therapy

## 2019-03-20 ENCOUNTER — Encounter: Payer: Self-pay | Admitting: Physical Therapy

## 2019-03-20 ENCOUNTER — Ambulatory Visit: Payer: PPO | Admitting: Occupational Therapy

## 2019-03-20 ENCOUNTER — Encounter: Payer: Self-pay | Admitting: Occupational Therapy

## 2019-03-20 DIAGNOSIS — M6281 Muscle weakness (generalized): Secondary | ICD-10-CM

## 2019-03-20 DIAGNOSIS — R278 Other lack of coordination: Secondary | ICD-10-CM

## 2019-03-20 DIAGNOSIS — R2681 Unsteadiness on feet: Secondary | ICD-10-CM

## 2019-03-20 NOTE — Therapy (Signed)
[place] MAIN [facility] SERVICES [address] [place], [place], [address] Phone: [phone]   Fax:  [phone]  Occupational Therapy Treatment  Patient Details  Name: Isaac Stevens MRN: [id number] Date of Birth: 12-13-1935 Referring Provider (OT): Dorise Hiss, [place]   Encounter Date: 03/20/2019  OT End of Session - 03/20/19 1146    Visit Number  5    Number of Visits  24    Date for OT Re-Evaluation  05/28/19    Authorization Type  Progress report period starting 02/20/2019    OT Start Time  1145    OT Stop Time  1226    OT Time Calculation (min)  41 min    Activity Tolerance  Patient tolerated treatment well    Behavior During Therapy  [facility] for tasks assessed/performed       Past Medical History:  Diagnosis Date  . CAFL (chronic airflow limitation) (HCC)   . CKD (chronic kidney disease)   . Diabetes mellitus without complication ([place])   . Hyperlipidemia   . Hypertension   . Rheumatoid arthritis [facility])     Past Surgical History:  Procedure Laterality Date  . TOE SURGERY     right foot    There were no vitals filed for this visit.  Subjective Assessment - 03/20/19 1145    Subjective   Pt. reports that it is hard to breath with his mask on.    Patient is accompanied by:  Family member    Pertinent History  Pt. is an 84 y.o. male who was initially admitted to [facility] for 24 hours following a CVA. Pt. was discharged, and one week later was admitted to [facility] with a CVA presenting with right sidedl weakness. Pt. received inpatient therapy services, and HHOT services upon discharge. Pt. is now ready for outpatient OT services.    Currently in Pain?  No/denies      OT TREATMENT    Neuro muscular re-education:  Pt. worked on gentle weightbearing, and proprioceptive input through the RUE forearm while seated at the mat to normalize tone, and prepare the UE for ROM.  Therapeutic Exercise:  Pt. performed PROM for the right shoulder,  and AAROM for shoulder flexion, and abduction while seated at the mat. Pt. performed AROM for the right elbow, flexion, extension, wrist extension, and digit flexion, extension, thumb abduction, and adduction. Pt. worked on the [employer]	[place] while seated at the mat with minimal, and moderate assistance.  Manual Therapy:  Pt.tolerated scapular mobilizations in elevation, depression, and rotation in preparation for ROM. Pt. Tolerated retograde massage for edema control to the right hand with the RUE elevated. Pt. Tolerated soft tissue mobilizations for carpal rolls, and metacarpal spread stretches to prepare the right hand for ROM.Manual techniques were performed independent of, and in preparation for therapeutic Ex.   Response to Treatment:  Pt. reports having a DialPulse at home that he has been using for the past 10 years. Pt. is making steady progress, and is using his right hand more at home for stbilizing, and holding objects while using he left hand to manipulate the items. Pt. continues to work on improving edema, and facilitating movement in the RUE, and hand function in order to increase engagement during ADLs, and IADL tasks.                        OT Education - 03/20/19 1146    Education Details  RUE functioning  Person(s) Educated  Patient    Methods  Explanation;Demonstration    Comprehension  Verbalized understanding;Returned demonstration          OT Long Term Goals - 03/05/19 1548      OT LONG TERM GOAL #1   Title  Pt. will increase Right shoulder AROM  in order to be able to reach into a cabinet.    Baseline  Eval: 0 degrees of isolated flexion (moves into 66 degrees of abduction with attempts for flexion)    Time  12    Period  Weeks    Status  New    Target Date  05/28/19      OT LONG TERM GOAL #2   Title  Pt. will increase right shoulder abduction to be able to independently wash his hair.    Baseline  Eval: Pt. is unable to use his Right  hand to wash his hair. Abduction: 70 degrees.    Time  12    Period  Weeks    Status  New    Target Date  05/28/19      OT LONG TERM GOAL #3   Title  Pt. will perform self-feeding with his right hand with minA.    Baseline  Eval: Pt. is unable to use his right hand for self-feeding.    Time  12    Period  Weeks    Status  New    Target Date  05/28/19      OT LONG TERM GOAL #4   Title  Pt. will increase right wrist extension by 10 degrees in preparation for reaching for a glass.    Baseline  Eval: -21 degrees. Pt. is unable.    Time  12    Period  Weeks    Status  New    Target Date  05/28/19      OT LONG TERM GOAL #5   Title  Pt. will independently use his right hand to wash his face.    Baseline  Eval: Pt. is unable to perform the task with his right hand.    Time  12    Period  Weeks    Status  New    Target Date  05/28/19      Long Term Additional Goals   Additional Long Term Goals  Yes      OT LONG TERM GOAL #6   Title  Pt. will be indpendent with UE dressing    Baseline  Eval: Pt. requires modA    Time  12    Period  Weeks    Status  New    Target Date  05/28/19      OT LONG TERM GOAL #7   Title  Pt. will require minA LE dressing    Baseline  Eval: Pt. requires modA    Time  12    Period  Weeks    Status  New    Target Date  05/28/19            Plan - 03/20/19 1147    Clinical Impression Statement Pt. reports having a DialPulse at home that he has been using for the past 10 years. Pt. is making steady progress, and is using his right hand more at home for stbilizing, and holding objects while using he left hand to manipulate the items. Pt. continues to work on improving edema, and facilitating movement in the RUE, and hand function in order to increase engagement during ADLs, and  IADL tasks.   Occupational performance deficits (Please refer to evaluation for details):  ADL's;IADL's;Education    Rehab Potential  Good    Clinical Decision Making  Several  treatment options, min-mod task modification necessary    Comorbidities Affecting Occupational Performance:  May have comorbidities impacting occupational performance    Modification or Assistance to Complete Evaluation   Min-Moderate modification of tasks or assist with assess necessary to complete eval    OT Frequency  2x / week    OT Duration  12 weeks    OT Treatment/Interventions  Self-care/ADL training;Patient/family education;Therapeutic activities;DME and/or AE instruction;Neuromuscular education;Therapeutic exercise    Consulted and Agree with Plan of Care  Patient;Family member/caregiver    Family Member Consulted  wife       Patient will benefit from skilled therapeutic intervention in order to improve the following deficits and impairments:           Visit Diagnosis: Muscle weakness (generalized)    Problem List Patient Active Problem List   Diagnosis Date Noted  . Stroke ([place]) 11/19/2018  . Syncope 10/21/2015  . Drug-induced pneumonitis 04/02/2015  . COPD (chronic obstructive pulmonary disease) ([place]) 04/02/2015  . Dyspnea 04/02/2015  . COPD exacerbation ([place]) 03/11/2015  . CAFL (chronic airflow limitation) ([place]) 08/14/2014  . HLD (hyperlipidemia) 08/14/2014  . BP (high blood pressure) 08/14/2014  . Diabetes mellitus type 2, uncontrolled ([place]) 08/14/2014  . Chronic kidney disease (CKD), stage III (moderate) 01/27/2014  . 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

## 2019-03-20 NOTE — Patient Instructions (Signed)
Access Code: [email or web address]  URL: [email or web address]  Date: 03/20/2019  Prepared by: Blanche East   Exercises Standing Hip Extension with Resistance at Ankles and Counter Support - 10 reps - 2 sets - 1x daily - 7x weekly Standing Hip Abduction with Resistance at Ankles and Counter Support - 10 reps - 2 sets - 1x daily - 7x weekly Side Stepping with Resistance at Ankles and Counter Support - 10 reps - 2 sets - 1x daily - 7x weekly Seated Ankle Dorsiflexion with Resistance - 10 reps - 2 sets - 1x daily - 7x weekly Sit to Stand - 10 reps - 2 sets - 1x daily - 7x weekly

## 2019-03-20 NOTE — Therapy (Signed)
[place] MAIN [facility] SERVICES [address] [place], [place], [address] Phone: [phone]   Fax:  [phone]  Physical Therapy Treatment  Patient Details  Name: Isaac Stevens MRN: [id number] Date of Birth: [birth year] Referring Provider (PT): Rachelle Hora, MD   Encounter Date: 03/20/2019  PT End of Session - 03/20/19 1118    Visit Number  2    Number of Visits  17    Date for PT Re-Evaluation  05/12/19    PT Start Time  1108    PT Stop Time  1145    PT Time Calculation (min)  37 min    Equipment Utilized During Treatment  Gait belt    Activity Tolerance  Patient tolerated treatment well    Behavior During Therapy  [facility] for tasks assessed/performed       Past Medical History:  Diagnosis Date  . CAFL (chronic airflow limitation) (HCC)   . CKD (chronic kidney disease)   . Diabetes mellitus without complication ([place])   . Hyperlipidemia   . Hypertension   . Rheumatoid arthritis [facility])     Past Surgical History:  Procedure Laterality Date  . TOE SURGERY     right foot    There were no vitals filed for this visit.  Subjective Assessment - 03/20/19 1117    Subjective  Patient reports doing okay. Denies any pain currently. Denies any new falls;    Pertinent History  Pt. is an 84 y.o. male who was initially admitted to [facility] for 24 hours following a CVA on 11/19/18. Pt. was discharged, and one week later was admitted to [facility] with a CVA presenting with right sided weakness. Pt. received inpatient therapy services, and [facility] rehab services upon discharge. He is now being referred for outpatient rehab. Patient has already started outpatient OT to address RUE weakness and ADLs. 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 He has also been referred to orthopedics in January 2021 due to RUE frozen shoulder/weakness and pain. He presents to therapy with tripod base cane which he reports using intermittently to reduce fall risk. He denies  any recent falls. He denies any numbness/tingling    Limitations  Standing;Walking    How long can you sit comfortably?  NA    How long can you stand comfortably?  5-10 min, with fatigue    How long can you walk comfortably?  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  No/denies    Multiple Pain Sites  No        TREATMENT:  [facility] PT Assessment - 03/20/19 0001      Berg Balance Test   Sit to Stand  Able to stand without using hands and stabilize independently    Standing Unsupported  Able to stand safely 2 minutes    Sitting with Back Unsupported but Feet Supported on Floor or Stool  Able to sit safely and securely 2 minutes    Stand to Sit  Sits safely with minimal use of hands    Transfers  Able to transfer safely, minor use of hands    Standing Unsupported with Eyes Closed  Able to stand 10 seconds with supervision    Standing Unsupported with Feet Together  Able to place feet together independently and stand for  1 minute with supervision    From Standing, Reach Forward with Outstretched Arm  Can reach forward >12 cm safely (5")    From Standing Position, Pick up Object from [place] to pick up shoe, needs supervision    From Standing Position, Turn to Look Behind Over each Shoulder  Looks behind one side only/other side shows less weight shift    Turn 360 Degrees  Able to turn 360 degrees safely but slowly    Standing Unsupported, Alternately Place Feet on Step/Stool  Needs assistance to keep from falling or unable to try    Standing Unsupported, One Foot in [place] to take small step independently and hold 30 seconds    Standing on One Leg  Tries to lift leg/unable to hold 3 seconds but remains standing independently    Total Score  40    Berg comment:  high fall risk         TREATMENT:  Warm up on [place] BUE/BLE  level 2 x4 min (unbilled);  Instructed patient in [place], see above;   Standing with green tband around BLE: -Hip Extension - 10 reps - 2 sets - -Hip Abduction - 10 reps - 2 sets  -Seated Ankle Dorsiflexion with Resistance - 10 reps RLE only; Sit to Stand - 10 reps  Patient required min-moderate verbal/tactile cues for correct exercise technique. Patient provided with written HEP for better adherence  NMR: Resisted walking 12.5# forward/backward x3 laps with tripod base cane with min A for safety and cues to increase step length for better dynamic balance safety;  Response to treatment: Tolerated well. Patient does report moderate fatigue at end of session. He exhibits less impulsiveness this session but did require min A for safety. Patient also required min VCs for proper exercise technique and positioning for optimal strengthening and balance.                  PT Education - 03/20/19 1117    Education Details  balance/strength, HEP    Person(s) Educated  Patient    Methods  Explanation;Verbal cues    Comprehension  Verbalized understanding;Returned demonstration;Verbal cues required;Need further instruction       PT Short Term Goals - 03/17/19 1245      PT SHORT TERM GOAL #1   Title  Patient will be adherent to HEP at least 3x a week to improve functional strength and balance for better safety at home.    Time  4    Period  Weeks    Status  New    Target Date  04/14/19      PT SHORT TERM GOAL #2   Title  Patient will increase BLE gross strength to 4/5 particularly in right hip as to improve functional strength for independent gait, increased standing tolerance and increased ADL ability    Time  4    Period  Weeks    Status  New    Target Date  04/14/19      PT SHORT TERM GOAL #3   Title  Patient will be mod I for all bed mobility including rolling and supine to sit without difficulty to increase independence at home.    Time  4    Period   Weeks    Status  New    Target Date  04/14/19        PT Long Term Goals - 03/17/19 1248      PT LONG TERM GOAL #  1   Title  Patient (> 71 years old) will complete five times sit to stand test in < 15 seconds indicating an increased LE strength and improved balance.    Time  8    Period  Weeks    Status  New    Target Date  05/12/19      PT LONG TERM GOAL #2   Title  Patient will demonstrate an improved Berg Balance Score of >48/56 as to demonstrate improved balance with ADLs such as sitting/standing and transfer balance and reduced fall risk.    Time  8    Period  Weeks    Status  New    Target Date  05/12/19      PT LONG TERM GOAL #3   Title  Patient will increase six minute walk test distance to >1000 for progression to community ambulator and improve gait ability    Time  8    Period  Weeks    Status  New    Target Date  05/12/19      PT LONG TERM GOAL #4   Title  Patient will tolerate 5 seconds of single leg stance without loss of balance to improve ability to get in and out of shower safely.    Time  8    Period  Weeks    Status  New    Target Date  05/12/19            Plan - 03/20/19 1218    Clinical Impression Statement  Patient late to session. Patient instructed in [place] to address fall risk. He did test at an increased risk for falls. He exhibits most unsteadiness with dynamic movement especially without assistive device. Patient instructed in HEP for LE strengthening. He does require min VCs for correct exercise technique and positioning. Patient denies any pain with advanced exercise. He would benefit from additional skilled PT intervention to improve strength, balance and mobility;    Personal Factors and Comorbidities  Comorbidity 3+;Age;Time since onset of injury/illness/exacerbation    Comorbidities  recent stroke, DM x2 (on medication), HTN (somewhat controlled), CKD, HLD, RA (worse in right hand)    Examination-Activity Limitations   Bed Mobility;Caring for Others;Carry;Dressing;Lift;Locomotion Level;Reach Overhead;Squat;Stairs;Stand;Transfers    Examination-Participation Restrictions  Church;Cleaning;Community Activity;Driving;Laundry;Meal Prep;School;Shop;Volunteer;Yard Work    Stability/Clinical Decision Making  Stable/Uncomplicated    Rehab Potential  Good    PT Frequency  2x / week    PT Duration  8 weeks    PT Treatment/Interventions  ADLs/Self Care Home Management;Cryotherapy;Electrical Stimulation;Moist Heat;Gait training;Functional mobility Scientist, forensic;Therapeutic activities;Therapeutic exercise;Balance training;Neuromuscular re-education;Patient/family education;Orthotic Fit/Training;Energy conservation    PT Next Visit Plan  initiate HEP    PT Home Exercise Plan  will address next session    Consulted and Agree with Plan of Care  Patient;Family member/caregiver    Family Member Consulted  spouse       Patient will benefit from skilled therapeutic intervention in order to improve the following deficits and impairments:  Abnormal gait, Decreased balance, Decreased endurance, Decreased mobility, Difficulty walking, Impaired perceived functional ability, Cardiopulmonary status limiting activity, Decreased activity tolerance, Decreased safety awareness, Decreased strength  Visit Diagnosis: Muscle weakness (generalized)  Unsteadiness on feet  Other lack of coordination     Problem List Patient Active Problem List   Diagnosis Date Noted  . Stroke ([place]) 11/19/2018  . Syncope 10/21/2015  . Drug-induced pneumonitis 04/02/2015  . COPD (chronic obstructive pulmonary disease) ([place]) 04/02/2015  . Dyspnea 04/02/2015  .  COPD exacerbation ([place]) 03/11/2015  . CAFL (chronic airflow limitation) ([place]) 08/14/2014  . HLD (hyperlipidemia) 08/14/2014  . BP (high blood pressure) 08/14/2014  . Diabetes mellitus type 2, uncontrolled ([place]) 08/14/2014  . Chronic kidney disease (CKD), stage III (moderate)  01/27/2014  . 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

## 2019-03-24 ENCOUNTER — Ambulatory Visit: Payer: PPO | Admitting: Occupational Therapy

## 2019-03-24 ENCOUNTER — Other Ambulatory Visit: Payer: Self-pay

## 2019-03-24 ENCOUNTER — Encounter: Payer: Self-pay | Admitting: Occupational Therapy

## 2019-03-24 DIAGNOSIS — R278 Other lack of coordination: Secondary | ICD-10-CM

## 2019-03-24 DIAGNOSIS — R2681 Unsteadiness on feet: Secondary | ICD-10-CM

## 2019-03-24 DIAGNOSIS — M6281 Muscle weakness (generalized): Secondary | ICD-10-CM

## 2019-03-25 ENCOUNTER — Encounter: Payer: PPO | Admitting: Occupational Therapy

## 2019-03-25 NOTE — Therapy (Signed)
[place] MAIN [facility] SERVICES [address] [place], [place], [address] Phone: [phone]   Fax:  [phone]  Occupational Therapy Treatment  Patient Details  Name: Isaac Stevens MRN: [id number] Date of Birth: [birth year] Referring Provider (OT): Dorise Hiss, [place]   Encounter Date: 03/24/2019  OT End of Session - 03/25/19 1916    Visit Number  6    Number of Visits  24    Date for OT Re-Evaluation  05/28/19    Authorization Type  Progress report period starting 02/20/2019    OT Start Time  1020    OT Stop Time  1100    OT Time Calculation (min)  40 min    Activity Tolerance  Patient tolerated treatment well    Behavior During Therapy  [facility] for tasks assessed/performed       Past Medical History:  Diagnosis Date  . CAFL (chronic airflow limitation) (HCC)   . CKD (chronic kidney disease)   . Diabetes mellitus without complication ([place])   . Hyperlipidemia   . Hypertension   . Rheumatoid arthritis [employer])     Past Surgical History:  Procedure Laterality Date  . TOE SURGERY     right foot   Circumferential measurements taken of right hand and wrist and compared to left hand as follows in cm:  Right hand     Left  Thumb 8.0 cm    7.6 cm Index 8.6 cm    7.7 cm Across MCPs 23.8 cm  22.6 cm Dorsum of hand 24.3 cm   22.4 cm Wrist 20.0 cm    18.9 cm  Fitted for and issued isotoner glove with open fingers with instruction and demonstration to patient and wife on donning doffing, wear, care.  Patient to wear at night to decrease edema.  Can be worn during the day but also want patient to use his hand as much as possible.    Patient seen for picking up jumbo pegs and placing into grid with right hand, guiding and cues as needed. Drops items frequently.  Patient has difficulty with reaching patterns.  Patient seen for wrist flexion/extension with AAROM/AROM ,cues and assist as needed.   Self care: Issued and instructed on use of built up  foam handle for use on utensil for self feeding, worked towards hand to mouth patterns, patient able to complete with effort.     Response to tx: Patient continues to progress, edema still present in right hand and will benefit from use of compression edema glove to reduce edema to promote increased ROM and functional use.  Baseline circumferential measurements with comparison for right and left UEs. Patient able to start with grasping patterns and working towards improved coordination skills. Continue to work towards goals in plan of care to maximize safety and independence in daily tasks. Monitor use of compression glove and measurements of hand.                            OT Education - 03/25/19 1916    Education Details  use of isotoner glove for edema control    Person(s) Educated  Patient    Methods  Explanation;Demonstration    Comprehension  Verbalized understanding;Returned demonstration          OT Long Term Goals - 03/05/19 1548      OT LONG TERM GOAL #1   Title  Pt. will increase Right shoulder AROM  in order  to be able to reach into a cabinet.    Baseline  Eval: 0 degrees of isolated flexion (moves into 66 degrees of abduction with attempts for flexion)    Time  12    Period  Weeks    Status  New    Target Date  05/28/19      OT LONG TERM GOAL #2   Title  Pt. will increase right shoulder abduction to be able to independently wash his hair.    Baseline  Eval: Pt. is unable to use his Right hand to wash his hair. Abduction: 70 degrees.    Time  12    Period  Weeks    Status  New    Target Date  05/28/19      OT LONG TERM GOAL #3   Title  Pt. will perform self-feeding with his right hand with minA.    Baseline  Eval: Pt. is unable to use his right hand for self-feeding.    Time  12    Period  Weeks    Status  New    Target Date  05/28/19      OT LONG TERM GOAL #4   Title  Pt. will increase right wrist extension by 10 degrees in preparation  for reaching for a glass.    Baseline  Eval: -21 degrees. Pt. is unable.    Time  12    Period  Weeks    Status  New    Target Date  05/28/19      OT LONG TERM GOAL #5   Title  Pt. will independently use his right hand to wash his face.    Baseline  Eval: Pt. is unable to perform the task with his right hand.    Time  12    Period  Weeks    Status  New    Target Date  05/28/19      Long Term Additional Goals   Additional Long Term Goals  Yes      OT LONG TERM GOAL #6   Title  Pt. will be indpendent with UE dressing    Baseline  Eval: Pt. requires modA    Time  12    Period  Weeks    Status  New    Target Date  05/28/19      OT LONG TERM GOAL #7   Title  Pt. will require minA LE dressing    Baseline  Eval: Pt. requires modA    Time  12    Period  Weeks    Status  New    Target Date  05/28/19            Plan - 03/25/19 1917    Clinical Impression Statement  Patient continues to progress, edema still present in right hand and will benefit from use of compression edema glove to reduce edema to promote increased ROM and functional use.  Baseline circumferential measurements with comparison for right and left UEs. Patient able to start with grasping patterns and working towards improved coordination skills. Continue to work towards goals in plan of care to maximize safety and independence in daily tasks. Monitor use of compression glove and measurements of hand.    Occupational performance deficits (Please refer to evaluation for details):  ADL's;IADL's;Education    Rehab Potential  Good    Clinical Decision Making  Several treatment options, min-mod task modification necessary    Comorbidities Affecting Occupational Performance:  May have comorbidities  impacting occupational performance    Modification or Assistance to Complete Evaluation   Min-Moderate modification of tasks or assist with assess necessary to complete eval    OT Frequency  2x / week    OT Duration  12  weeks    OT Treatment/Interventions  Self-care/ADL training;Patient/family education;Therapeutic activities;DME and/or AE instruction;Neuromuscular education;Therapeutic exercise    Consulted and Agree with Plan of Care  Patient;Family member/caregiver       Patient will benefit from skilled therapeutic intervention in order to improve the following deficits and impairments:           Visit Diagnosis: Muscle weakness (generalized)  Other lack of coordination  Unsteadiness on feet    Problem List Patient Active Problem List   Diagnosis Date Noted  . Stroke ([place]) 11/19/2018  . Syncope 10/21/2015  . Drug-induced pneumonitis 04/02/2015  . COPD (chronic obstructive pulmonary disease) ([place]) 04/02/2015  . Dyspnea 04/02/2015  . COPD exacerbation ([place]) 03/11/2015  . CAFL (chronic airflow limitation) ([place]) 08/14/2014  . HLD (hyperlipidemia) 08/14/2014  . BP (high blood pressure) 08/14/2014  . Diabetes mellitus type 2, uncontrolled ([place]) 08/14/2014  . Chronic kidney disease (CKD), stage III (moderate) 01/27/2014  . 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 [place], [place], [address] Phone: [phone]   Fax:  [phone]  Name: Isaac Stevens MRN: [id number] Date of Birth: Nov 21, 1935

## 2019-03-27 ENCOUNTER — Ambulatory Visit: Payer: PPO | Admitting: Occupational Therapy

## 2019-03-31 ENCOUNTER — Ambulatory Visit: Payer: PPO | Admitting: Occupational Therapy

## 2019-03-31 ENCOUNTER — Encounter: Payer: Self-pay | Admitting: Occupational Therapy

## 2019-03-31 ENCOUNTER — Other Ambulatory Visit: Payer: Self-pay

## 2019-03-31 DIAGNOSIS — M6281 Muscle weakness (generalized): Secondary | ICD-10-CM | POA: Diagnosis not present

## 2019-03-31 DIAGNOSIS — R2681 Unsteadiness on feet: Secondary | ICD-10-CM

## 2019-03-31 DIAGNOSIS — R278 Other lack of coordination: Secondary | ICD-10-CM

## 2019-03-31 NOTE — Therapy (Signed)
[place] MAIN [facility] SERVICES [address] [place], [place], [address] Phone: [phone]   Fax:  [phone]  Occupational Therapy Treatment  Patient Details  Name: Isaac Stevens MRN: [id number] Date of Birth: 11/08/1935 Referring Provider (OT): Dorise Hiss, [place]   Encounter Date: 03/31/2019  OT End of Session - 03/31/19 1737    Visit Number  7    Number of Visits  24    Date for OT Re-Evaluation  05/28/19    Authorization Type  Progress report period starting 02/20/2019    OT Start Time  1019    OT Stop Time  1100    OT Time Calculation (min)  41 min    Activity Tolerance  Patient tolerated treatment well    Behavior During Therapy  [facility] for tasks assessed/performed       Past Medical History:  Diagnosis Date  . CAFL (chronic airflow limitation) (HCC)   . CKD (chronic kidney disease)   . Diabetes mellitus without complication ([place])   . Hyperlipidemia   . Hypertension   . Rheumatoid arthritis [facility])     Past Surgical History:  Procedure Laterality Date  . TOE SURGERY     right foot    There were no vitals filed for this visit.  Subjective Assessment - 03/31/19 1729    Subjective   Patient reports he has done well with hand to mouth exercises and able to now touch mouth with right hand.  He is pleased with his progress this past week.  He reports wearing his compression glove a few nights but not all.  Edema appears decreased, will take measurements.    Pertinent History  Pt. is an 84 y.o. male who was initially admitted to [facility] for 24 hours following a CVA. Pt. was discharged, and one week later was admitted to [facility] with a CVA presenting with right sidedl weakness. Pt. received inpatient therapy services, and HHOT services upon discharge. Pt. is now ready for outpatient OT services.    Patient Stated Goals  To regain the use of his right arm.    Currently in Pain?  No/denies    Multiple Pain Sites  No      Patient denies any  pain this date.   He reports performing Hand to mouth patterns at home as a part of his homework and is doing well, able to demonstrate touching mouth with right hand today! Patient has been using compression glove a few nights but is not sure if it has made any difference.   Circumferential measurements of right hand taken as follows: Right hand                                           Left  Thumb 8.0 cm                                     7.6 cm Index 8.5 cm                                       7.7 cm Across MCPs 23.3 cm  22.6 cm Dorsum of hand 23.8 cm                    22.4 cm Wrist 19.5 cm                           18.9 cm  Edema decreased in some areas up to  .5 cm, recommend patient wear glove nightly for the next few nights since he is seeing results.   PROM followed by [facility] with therapist guding and supporting right arm for shoulder flexion to 90 degrees, elbow flexion and extension, ER, wrist flex/ext and digit flexion/ext.   Patient performing Push and pull against therapist with right UE.   Patient working towards Reaching to opposite shoulder with right UE for 2 sets of 5 reps each, therapist guiding and assisting with end range. Patient reaching right Ear and opposite ear with right hand with some guiding and assist as needed.   Picking up and attempts to manipulate 1 inch Velcro squares with right hand.  Cues for prehension patterns.   Difficulty  to remove without hooking the finger onto square when working against resistance of velcro, when there is no resistance, patient is working towards picking up with tip to tip prehension pattern.   Washcloth under right hand and patient performing wiping table forward, right, left, across, circular patterns with occasional guiding and cues.    Response to tx: Patient demonstrates decreased edema in right hand especially across MP joints, dorsum of the hand and wrist with circumferential measurements and visually  has looser skin in this area.  Hand to mouth patterns on right have improved significantly and patient to continue to work towards using built up utensil on the right at home.  Active movement increased since last session and patient is pleased with his progress.  Continue to work towards goals to increase independence in daily tasks.                      OT Education - 03/31/19 1737    Education Details  continued focus on edema control, hand to mouth patterns and use of right hand to pick up objects    Person(s) Educated  Patient    Methods  Explanation;Demonstration    Comprehension  Verbalized understanding;Returned demonstration          OT Long Term Goals - 03/05/19 1548      OT LONG TERM GOAL #1   Title  Pt. will increase Right shoulder AROM  in order to be able to reach into a cabinet.    Baseline  Eval: 0 degrees of isolated flexion (moves into 66 degrees of abduction with attempts for flexion)    Time  12    Period  Weeks    Status  New    Target Date  05/28/19      OT LONG TERM GOAL #2   Title  Pt. will increase right shoulder abduction to be able to independently wash his hair.    Baseline  Eval: Pt. is unable to use his Right hand to wash his hair. Abduction: 70 degrees.    Time  12    Period  Weeks    Status  New    Target Date  05/28/19      OT LONG TERM GOAL #3   Title  Pt. will perform self-feeding with his right hand with minA.    Baseline  Eval: Pt. is unable to use his right hand for self-feeding.    Time  12    Period  Weeks    Status  New    Target Date  05/28/19      OT LONG TERM GOAL #4   Title  Pt. will increase right wrist extension by 10 degrees in preparation for reaching for a glass.    Baseline  Eval: -21 degrees. Pt. is unable.    Time  12    Period  Weeks    Status  New    Target Date  05/28/19      OT LONG TERM GOAL #5   Title  Pt. will independently use his right hand to wash his face.    Baseline  Eval: Pt. is  unable to perform the task with his right hand.    Time  12    Period  Weeks    Status  New    Target Date  05/28/19      Long Term Additional Goals   Additional Long Term Goals  Yes      OT LONG TERM GOAL #6   Title  Pt. will be indpendent with UE dressing    Baseline  Eval: Pt. requires modA    Time  12    Period  Weeks    Status  New    Target Date  05/28/19      OT LONG TERM GOAL #7   Title  Pt. will require minA LE dressing    Baseline  Eval: Pt. requires modA    Time  12    Period  Weeks    Status  New    Target Date  05/28/19            Plan - 03/31/19 1738    Clinical Impression Statement  Patient demonstrates decreased edema in right hand especially across MP joints, dorsum of the hand and wrist with circumferential measurements and visually has looser skin in this area.  Hand to mouth patterns on right have improved significantly and patient to continue to work towards using built up utensil on the right at home.  Active movement increased since last session and patient is pleased with his progress.  Continue to work towards goals to increase independence in daily tasks.    Occupational performance deficits (Please refer to evaluation for details):  ADL's;IADL's;Education    Rehab Potential  Good    Clinical Decision Making  Several treatment options, min-mod task modification necessary    Comorbidities Affecting Occupational Performance:  May have comorbidities impacting occupational performance    Modification or Assistance to Complete Evaluation   Min-Moderate modification of tasks or assist with assess necessary to complete eval    OT Frequency  2x / week    OT Duration  12 weeks    OT Treatment/Interventions  Self-care/ADL training;Patient/family education;Therapeutic activities;DME and/or AE instruction;Neuromuscular education;Therapeutic exercise    Consulted and Agree with Plan of Care  Patient       Patient will benefit from skilled therapeutic  intervention in order to improve the following deficits and impairments:           Visit Diagnosis: Muscle weakness (generalized)  Other lack of coordination  Unsteadiness on feet    Problem List Patient Active Problem List   Diagnosis Date Noted  . Stroke ([place]) 11/19/2018  . Syncope 10/21/2015  . Drug-induced pneumonitis 04/02/2015  . COPD (chronic obstructive pulmonary disease) ([place]) 04/02/2015  .  Dyspnea 04/02/2015  . COPD exacerbation ([place]) 03/11/2015  . CAFL (chronic airflow limitation) ([place]) 08/14/2014  . HLD (hyperlipidemia) 08/14/2014  . BP (high blood pressure) 08/14/2014  . Diabetes mellitus type 2, uncontrolled ([place]) 08/14/2014  . Chronic kidney disease (CKD), stage III (moderate) 01/27/2014  . 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 SERVICES [address] [place], [place], [address] Phone: [phone]   Fax:  [phone]  Name: MATIS DEMICK MRN: [id number] Date of Birth: 1935/07/17

## 2019-04-01 ENCOUNTER — Encounter: Payer: PPO | Admitting: Occupational Therapy

## 2019-04-03 ENCOUNTER — Other Ambulatory Visit: Payer: Self-pay

## 2019-04-03 ENCOUNTER — Encounter: Payer: Self-pay | Admitting: Occupational Therapy

## 2019-04-03 ENCOUNTER — Ambulatory Visit: Payer: PPO | Admitting: Occupational Therapy

## 2019-04-03 DIAGNOSIS — R278 Other lack of coordination: Secondary | ICD-10-CM

## 2019-04-03 DIAGNOSIS — M6281 Muscle weakness (generalized): Secondary | ICD-10-CM | POA: Diagnosis not present

## 2019-04-03 NOTE — Therapy (Signed)
[place] MAIN [facility] SERVICES [address] [place], [place], [address] Phone: [phone]   Fax:  [phone]  Occupational Therapy Treatment  Patient Details  Name: Isaac Stevens MRN: [id number] Date of Birth: 13-Mar-1935 Referring Provider (OT): Dorise Hiss, [place]   Encounter Date: 04/03/2019  OT End of Session - 04/03/19 1447    Visit Number  8    Number of Visits  24    Date for OT Re-Evaluation  05/28/19    Authorization Type  Progress report period starting 02/20/2019    OT Start Time  1202    OT Stop Time  1230    OT Time Calculation (min)  28 min    Activity Tolerance  Patient tolerated treatment well    Behavior During Therapy  [facility] for tasks assessed/performed       Past Medical History:  Diagnosis Date  . CAFL (chronic airflow limitation) (HCC)   . CKD (chronic kidney disease)   . Diabetes mellitus without complication ([place])   . Hyperlipidemia   . Hypertension   . Rheumatoid arthritis [facility])     Past Surgical History:  Procedure Laterality Date  . TOE SURGERY     right foot    There were no vitals filed for this visit.  Subjective Assessment - 04/03/19 1446    Subjective   Pt. reports that they were runninglate today.    Patient is accompanied by:  Family member    Pertinent History  Pt. is an 84 y.o. male who was initially admitted to [facility] for 24 hours following a CVA. Pt. was discharged, and one week later was admitted to [facility] with a CVA presenting with right sidedl weakness. Pt. received inpatient therapy services, and HHOT services upon discharge. Pt. is now ready for outpatient OT services.    Currently in Pain?  No/denies      OT TREATMENT    Therapeutic Exercise:  Pt. Tolerated shoulder AAROM for shoulder flexion, abduction, and horizontal abduction, and adduction. Pt. worked on tabletop stretches with RUE shoulder flexion, and elbow extension.  Selfcare:   Pt. Worked on initiating formulating written  text holding a pen. Pt. Was able to hold a large grip pen in his right hand, and was able to initiate formulating his initials.  With 50% legibility, and his name with 25% legibility. Pt. Formulates the letters while moving his hand, and wrist and one unit.     Manual Therapy:  Tolerated retograde massage for edema control to the right hand with the Right hand elevated.  Response to Treatment:  Pt. was late for the session today. Pt. continues to present with edema in the right hand, and digits.  Circumferencial Measurements MCPs: Right: 23.5 cm, Left: 22.5 cm, Wrist: Right: 20cm, Left: 18.5 cm. Pt. has started to use his right hand to initiate writing at home.  Pt. reports being able to write his initials Pt. continues to be able to perform hand to mouth patterns consistently with his right hand, however is unable to reach higher than his mouth. Pt. continues to work on improving RUE strength, and [facility] skills in order to work towards using his RUE more during ADLs, and IADL tasks.                      OT Education - 04/03/19 1447    Education Details  Edema control, right UE ther ex.    Person(s) Educated  Patient    Methods  Explanation;Demonstration    Comprehension  Verbalized understanding;Returned demonstration          OT Long Term Goals - 03/05/19 1548      OT LONG TERM GOAL #1   Title  Pt. will increase Right shoulder AROM  in order to be able to reach into a cabinet.    Baseline  Eval: 0 degrees of isolated flexion (moves into 66 degrees of abduction with attempts for flexion)    Time  12    Period  Weeks    Status  New    Target Date  05/28/19      OT LONG TERM GOAL #2   Title  Pt. will increase right shoulder abduction to be able to independently wash his hair.    Baseline  Eval: Pt. is unable to use his Right hand to wash his hair. Abduction: 70 degrees.    Time  12    Period  Weeks    Status  New    Target Date  05/28/19      OT LONG TERM GOAL  #3   Title  Pt. will perform self-feeding with his right hand with minA.    Baseline  Eval: Pt. is unable to use his right hand for self-feeding.    Time  12    Period  Weeks    Status  New    Target Date  05/28/19      OT LONG TERM GOAL #4   Title  Pt. will increase right wrist extension by 10 degrees in preparation for reaching for a glass.    Baseline  Eval: -21 degrees. Pt. is unable.    Time  12    Period  Weeks    Status  New    Target Date  05/28/19      OT LONG TERM GOAL #5   Title  Pt. will independently use his right hand to wash his face.    Baseline  Eval: Pt. is unable to perform the task with his right hand.    Time  12    Period  Weeks    Status  New    Target Date  05/28/19      Long Term Additional Goals   Additional Long Term Goals  Yes      OT LONG TERM GOAL #6   Title  Pt. will be indpendent with UE dressing    Baseline  Eval: Pt. requires modA    Time  12    Period  Weeks    Status  New    Target Date  05/28/19      OT LONG TERM GOAL #7   Title  Pt. will require minA LE dressing    Baseline  Eval: Pt. requires modA    Time  12    Period  Weeks    Status  New    Target Date  05/28/19            Plan - 04/03/19 1447    Clinical Impression Statement Pt. was late for the session today. Pt. continues to present with edema in the right hand, and digits.  Circumferencial Measurements MCPs: Right: 23.5 cm, Left: 22.5 cm, Wrist: Right: 20cm, Left: 18.5 cm. Pt. has started to use his right hand to initiate writing at home.  Pt. reports being able to write his initials Pt. continues to be able to perform hand to mouth patterns consistently with his right hand, however is unable to reach  higher than his mouth. Pt. continues to work on improving RUE strength, and [facility] skills in order to work towards using his RUE more during ADLs, and IADL tasks.    Occupational performance deficits (Please refer to evaluation for details):  ADL's;IADL's;Education    Rehab  Potential  Good    Clinical Decision Making  Several treatment options, min-mod task modification necessary    Comorbidities Affecting Occupational Performance:  May have comorbidities impacting occupational performance    Modification or Assistance to Complete Evaluation   Min-Moderate modification of tasks or assist with assess necessary to complete eval    OT Frequency  2x / week    OT Duration  12 weeks    OT Treatment/Interventions  Self-care/ADL training;Patient/family education;Therapeutic activities;DME and/or AE instruction;Neuromuscular education;Therapeutic exercise    Consulted and Agree with Plan of Care  Patient    Family Member Consulted  wife       Patient will benefit from skilled therapeutic intervention in order to improve the following deficits and impairments:           Visit Diagnosis: Muscle weakness (generalized)  Other lack of coordination    Problem List Patient Active Problem List   Diagnosis Date Noted  . Stroke ([place]) 11/19/2018  . Syncope 10/21/2015  . Drug-induced pneumonitis 04/02/2015  . COPD (chronic obstructive pulmonary disease) ([place]) 04/02/2015  . Dyspnea 04/02/2015  . COPD exacerbation ([place]) 03/11/2015  . CAFL (chronic airflow limitation) ([place]) 08/14/2014  . HLD (hyperlipidemia) 08/14/2014  . BP (high blood pressure) 08/14/2014  . Diabetes mellitus type 2, uncontrolled ([place]) 08/14/2014  . Chronic kidney disease (CKD), stage III (moderate) 01/27/2014  . 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 [place], [place], [address] Phone: [phone]   Fax:  [phone]  Name: Isaac Stevens MRN: [id number] Date of Birth: 11-Feb-1935

## 2019-04-07 ENCOUNTER — Ambulatory Visit: Payer: PPO | Admitting: Occupational Therapy

## 2019-04-07 ENCOUNTER — Ambulatory Visit: Payer: PPO

## 2019-04-08 ENCOUNTER — Encounter: Payer: PPO | Admitting: Occupational Therapy

## 2019-04-09 DIAGNOSIS — E538 Deficiency of other specified B group vitamins: Secondary | ICD-10-CM | POA: Diagnosis not present

## 2019-04-09 DIAGNOSIS — I10 Essential (primary) hypertension: Secondary | ICD-10-CM | POA: Diagnosis not present

## 2019-04-09 DIAGNOSIS — E118 Type 2 diabetes mellitus with unspecified complications: Secondary | ICD-10-CM | POA: Diagnosis not present

## 2019-04-09 DIAGNOSIS — E7849 Other hyperlipidemia: Secondary | ICD-10-CM | POA: Diagnosis not present

## 2019-04-09 DIAGNOSIS — E1165 Type 2 diabetes mellitus with hyperglycemia: Secondary | ICD-10-CM | POA: Diagnosis not present

## 2019-04-10 ENCOUNTER — Encounter: Payer: Self-pay | Admitting: Occupational Therapy

## 2019-04-10 ENCOUNTER — Other Ambulatory Visit: Payer: Self-pay

## 2019-04-10 ENCOUNTER — Ambulatory Visit: Payer: PPO | Attending: Orthopedic Surgery | Admitting: Occupational Therapy

## 2019-04-10 DIAGNOSIS — R2681 Unsteadiness on feet: Secondary | ICD-10-CM | POA: Insufficient documentation

## 2019-04-10 DIAGNOSIS — R278 Other lack of coordination: Secondary | ICD-10-CM | POA: Diagnosis not present

## 2019-04-10 DIAGNOSIS — M6281 Muscle weakness (generalized): Secondary | ICD-10-CM | POA: Insufficient documentation

## 2019-04-10 NOTE — Therapy (Addendum)
[place] MAIN [facility] SERVICES [address] [place], [place], [address] Phone: [phone]   Fax:  [phone]  Occupational Therapy Treatment  Patient Details  Name: Isaac Stevens MRN: [id number] Date of Birth: 1935-09-12 Referring Provider (OT): Dorise Hiss, [place]   Encounter Date: 04/10/2019  OT End of Session - 04/10/19 1613    Visit Number  9    Number of Visits  24    Date for OT Re-Evaluation  05/28/19    Authorization Type  Progress report period starting 02/20/2019    OT Start Time  1150    OT Stop Time  1230    OT Time Calculation (min)  40 min    Activity Tolerance  Patient tolerated treatment well    Behavior During Therapy  [facility] for tasks assessed/performed       Past Medical History:  Diagnosis Date  . CAFL (chronic airflow limitation) (HCC)   . CKD (chronic kidney disease)   . Diabetes mellitus without complication ([place])   . Hyperlipidemia   . Hypertension   . Rheumatoid arthritis [facility])     Past Surgical History:  Procedure Laterality Date  . TOE SURGERY     right foot    There were no vitals filed for this visit.  Subjective Assessment - 04/10/19 1612    Subjective   Pt. reports he is doing well today.    Patient is accompanied by:  Family member    Pertinent History  Pt. is an 84 y.o. male who was initially admitted to [facility] for 24 hours following a CVA. Pt. was discharged, and one week later was admitted to [facility] with a CVA presenting with right sidedl weakness. Pt. received inpatient therapy services, and HHOT services upon discharge. Pt. is now ready for outpatient OT services.    Special Tests  FOTO score: 43    Patient Stated Goals  To regain the use of his right arm.    Currently in Pain?  No/denies      OT TREATMENT    Neuro muscular re-education:  Pt. worked on using his right hand to grasp, and manipulate 171/2" [place] style discs. Pt. worked on [employer] discs high with reaching up with his  RUE to stack the discs.  Therapeutic Exercise:   Pt. Tolerated right shoulder UE AAROM with PROM stretching to the end range for shoulder flexion, abduction, AROM for right elbow flexion, extension.    Manual Therapy:  Pt. Tolerated retrograde massage to the right hand, and digits. Pt. responded well with decreased edema noted in the right hand following. Pt. Was able to formulate gross composite fist following with the second digit unable to reach the [facility]. Pt. worked with the right hand, and forearm elevated. Manual techniques were perfromed independent of, and in preparation for ROM.  Pt. presents with increased edema in the right wrist, hand, and digits today. Pt. reports that he has been wearing the glove some, and trying to use his hand more during tasks at home. Pt. continues to present with RUE weakness, limited ROM, RUE strength, limited motor control, and [facility] skills. Pt. continues to work on these skills in order to improve RUE functioning dueing ADLs, and IADL tasks.                         OT Education - 04/10/19 1613    Education Details  Edema control, right UE ther ex.    Person(s)  Educated  Patient    Methods  Explanation;Demonstration    Comprehension  Verbalized understanding;Returned demonstration          OT Long Term Goals - 03/05/19 1548      OT LONG TERM GOAL #1   Title  Pt. will increase Right shoulder AROM  in order to be able to reach into a cabinet.    Baseline  Eval: 0 degrees of isolated flexion (moves into 66 degrees of abduction with attempts for flexion)    Time  12    Period  Weeks    Status  New    Target Date  05/28/19      OT LONG TERM GOAL #2   Title  Pt. will increase right shoulder abduction to be able to independently wash his hair.    Baseline  Eval: Pt. is unable to use his Right hand to wash his hair. Abduction: 70 degrees.    Time  12    Period  Weeks    Status  New    Target Date  05/28/19      OT LONG TERM  GOAL #3   Title  Pt. will perform self-feeding with his right hand with minA.    Baseline  Eval: Pt. is unable to use his right hand for self-feeding.    Time  12    Period  Weeks    Status  New    Target Date  05/28/19      OT LONG TERM GOAL #4   Title  Pt. will increase right wrist extension by 10 degrees in preparation for reaching for a glass.    Baseline  Eval: -21 degrees. Pt. is unable.    Time  12    Period  Weeks    Status  New    Target Date  05/28/19      OT LONG TERM GOAL #5   Title  Pt. will independently use his right hand to wash his face.    Baseline  Eval: Pt. is unable to perform the task with his right hand.    Time  12    Period  Weeks    Status  New    Target Date  05/28/19      Long Term Additional Goals   Additional Long Term Goals  Yes      OT LONG TERM GOAL #6   Title  Pt. will be indpendent with UE dressing    Baseline  Eval: Pt. requires modA    Time  12    Period  Weeks    Status  New    Target Date  05/28/19      OT LONG TERM GOAL #7   Title  Pt. will require minA LE dressing    Baseline  Eval: Pt. requires modA    Time  12    Period  Weeks    Status  New    Target Date  05/28/19            Plan - 04/10/19 1614    Clinical Impression Statement Pt. presents with increased edema in the right wrist, hand, and digits today. Pt. reports that he has been wearing the glove some, and trying to use his hand more during tasks at home. Pt. continues to present with RUE weakness, limited ROM, RUE strength, limited motor control, and [facility] skills. Pt. continues to work on these skills in order to improve RUE functioning dueing ADLs, and IADL tasks.  Occupational performance deficits (Please refer to evaluation for details):  ADL's;IADL's;Education    Rehab Potential  Good    Clinical Decision Making  Several treatment options, min-mod task modification necessary    Comorbidities Affecting Occupational Performance:  May have comorbidities  impacting occupational performance    Modification or Assistance to Complete Evaluation   Min-Moderate modification of tasks or assist with assess necessary to complete eval    OT Frequency  2x / week    OT Duration  12 weeks    OT Treatment/Interventions  Self-care/ADL training;Patient/family education;Therapeutic activities;DME and/or AE instruction;Neuromuscular education;Therapeutic exercise    Consulted and Agree with Plan of Care  Patient    Family Member Consulted  wife       Patient will benefit from skilled therapeutic intervention in order to improve the following deficits and impairments:           Visit Diagnosis: Muscle weakness (generalized)  Other lack of coordination    Problem List Patient Active Problem List   Diagnosis Date Noted  . Stroke ([place]) 11/19/2018  . Syncope 10/21/2015  . Drug-induced pneumonitis 04/02/2015  . COPD (chronic obstructive pulmonary disease) ([place]) 04/02/2015  . Dyspnea 04/02/2015  . COPD exacerbation ([place]) 03/11/2015  . CAFL (chronic airflow limitation) ([place]) 08/14/2014  . HLD (hyperlipidemia) 08/14/2014  . BP (high blood pressure) 08/14/2014  . Diabetes mellitus type 2, uncontrolled ([place]) 08/14/2014  . Chronic kidney disease (CKD), stage III (moderate) 01/27/2014  . 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 [place], [place], [address] Phone: [phone]   Fax:  [phone]  Name: Isaac Stevens MRN: [id number] Date of Birth: Jun 19, 1935

## 2019-04-14 ENCOUNTER — Other Ambulatory Visit: Payer: Self-pay

## 2019-04-14 ENCOUNTER — Ambulatory Visit: Payer: PPO | Admitting: Occupational Therapy

## 2019-04-14 ENCOUNTER — Ambulatory Visit: Payer: PPO

## 2019-04-14 DIAGNOSIS — M6281 Muscle weakness (generalized): Secondary | ICD-10-CM

## 2019-04-14 DIAGNOSIS — R278 Other lack of coordination: Secondary | ICD-10-CM

## 2019-04-14 NOTE — Therapy (Signed)
[place] MAIN [facility] SERVICES [address] [place], [place], [address] Phone: [phone]   Fax:  [phone]  Occupational Therapy Progress Note  Dates of reporting period  02/20/2019   to   04/14/2019  Patient Details  Name: Isaac Stevens MRN: [id number] Date of Birth: Sep 18, 1935 Referring Provider (OT): Dorise Hiss, [place]   Encounter Date: 04/14/2019  OT End of Session - 04/14/19 2021    Visit Number  10    Number of Visits  24    Date for OT Re-Evaluation  05/28/19    Authorization Type  Progress report period starting 02/20/2019    OT Start Time  1015    OT Stop Time  1100    OT Time Calculation (min)  45 min    Activity Tolerance  Patient tolerated treatment well    Behavior During Therapy  [facility] for tasks assessed/performed       Past Medical History:  Diagnosis Date  . CAFL (chronic airflow limitation) (HCC)   . CKD (chronic kidney disease)   . Diabetes mellitus without complication ([place])   . Hyperlipidemia   . Hypertension   . Rheumatoid arthritis [facility])     Past Surgical History:  Procedure Laterality Date  . TOE SURGERY     right foot    There were no vitals filed for this visit.  Subjective Assessment - 04/14/19 2021    Subjective   Pt. reports that he can tell hisright hand is improving.    Patient is accompanied by:  Family member    Pertinent History  Pt. is an 84 y.o. male who was initially admitted to [facility] for 24 hours following a CVA. Pt. was discharged, and one week later was admitted to [facility] with a CVA presenting with right sidedl weakness. Pt. received inpatient therapy services, and HHOT services upon discharge. Pt. is now ready for outpatient OT services.    Patient Stated Goals  To regain the use of his right arm.    Currently in Pain?  No/denies         [facility] OT Assessment - 04/14/19 0001      Coordination   Right 9 Hole Peg Test  3 min. & 3 sec.    Left 9 Hole Peg Test  30 sec.      PROM   Overall PROM Comments  Right Shoulder: flexion 20(115), Abduction: 62(95), elbow 15(0)-136, wrist extension --15(30)      Hand Function   Right Hand Grip (lbs)  6#    Right Hand Lateral Pinch  5 lbs      Measurements were obtained, and goals were reviewed with the pt.                  OT Education - 04/14/19 2021    Education Details  Edema control, right UE ther ex.    Person(s) Educated  Patient    Methods  Explanation;Demonstration    Comprehension  Verbalized understanding;Returned demonstration          OT Long Term Goals - 04/14/19 1055      OT LONG TERM GOAL #1   Title  Pt. will increase Right shoulder AROM  in order to be able to reach into a cabinet.    Baseline  04/14/2019: 20 degrees of isolated flexion. Pt. is unable to reach up into cabinets.    Time  12    Period  Weeks    Status  On-going  Target Date  05/28/19      OT LONG TERM GOAL #2   Title  Pt. will increase right shoulder abduction to be able to independently wash his hair.    Baseline  04/14/2019  Pt. presents with limited abduction, and is unable to reach up to his head to wash his hair.    Time  12    Period  Weeks    Status  On-going    Target Date  05/28/19      OT LONG TERM GOAL #3   Title  Pt. will perform self-feeding with his right hand with minA.    Baseline  Pt. requires modA, and is able to assist with using a fork with the right hand  when eating an eggs.    Time  12    Period  Weeks    Status  On-going    Target Date  05/28/19      OT LONG TERM GOAL #4   Title  Pt. will increase right wrist extension by 10 degrees in preparation for reaching for a glass.    Baseline  04/14/2019: -15 degrees. Pt. has difficulty reaching for a glass.    Time  12    Period  Weeks    Status  On-going    Target Date  05/28/19      OT LONG TERM GOAL #5   Title  Pt. will independently use his right hand to wash his face.    Baseline  04/14/2019: Pt. is now able to reach his face with  his right hand. Pt.is unable to wash his face.    Time  12    Period  Weeks    Status  On-going    Target Date  05/28/19      OT LONG TERM GOAL #6   Title  Pt. will be independent with UE dressing    Baseline  04/14/2019 Pt. requires modA    Time  12    Period  Weeks    Status  On-going    Target Date  05/28/19      OT LONG TERM GOAL #7   Title  Pt. will require minA LE dressing    Baseline  04/14/2019: pt. requires minA donning socks. Pt. requires modA with pants, and shoes.    Time  12    Period  Weeks    Target Date  05/28/19            Plan - 04/14/19 2022    Clinical Impression Statement Pt. is making steady progress overall. Pt. Is improving with UE ,and LE dressing skills.  Pt. is now able to use his right hand to reach his face, initiate performing hand to face patterns, and is starting to perform self-feeding skills.  Pt. has improved with right grip strength, pinch strength,  and was able to complete the 9-hole peg test with increased time. FOTO score: 52 (Initial intake score: 43) Pt. continues to present with increased edema in the right hand. Circumferential measurements at the right wrist: 19.5 cm, MCPs: 24 cm. Pt. continues to work on improving RUE functioning in order to work towards maximizing independence with ADLs, and IADLs.    Occupational performance deficits (Please refer to evaluation for details):  ADL's;IADL's;Education    Rehab Potential  Good    Clinical Decision Making  Several treatment options, min-mod task modification necessary    Comorbidities Affecting Occupational Performance:  May have comorbidities impacting occupational performance  Modification or Assistance to Complete Evaluation   Min-Moderate modification of tasks or assist with assess necessary to complete eval    OT Frequency  2x / week    OT Duration  12 weeks    OT Treatment/Interventions  Self-care/ADL training;Patient/family education;Therapeutic activities;DME and/or AE  instruction;Neuromuscular education;Therapeutic exercise    Consulted and Agree with Plan of Care  Patient    Family Member Consulted  wife       Patient will benefit from skilled therapeutic intervention in order to improve the following deficits and impairments:           Visit Diagnosis: Muscle weakness (generalized)  Other lack of coordination    Problem List Patient Active Problem List   Diagnosis Date Noted  . Stroke ([place]) 11/19/2018  . Syncope 10/21/2015  . Drug-induced pneumonitis 04/02/2015  . COPD (chronic obstructive pulmonary disease) ([place]) 04/02/2015  . Dyspnea 04/02/2015  . COPD exacerbation ([place]) 03/11/2015  . CAFL (chronic airflow limitation) ([place]) 08/14/2014  . HLD (hyperlipidemia) 08/14/2014  . BP (high blood pressure) 08/14/2014  . Diabetes mellitus type 2, uncontrolled ([place]) 08/14/2014  . Chronic kidney disease (CKD), stage III (moderate) 01/27/2014  . 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 [place], [place], [address] Phone: [phone]   Fax:  [phone]  Name: Isaac Stevens MRN: [id number] Date of Birth: 08/12/1935

## 2019-04-15 ENCOUNTER — Encounter: Payer: PPO | Admitting: Occupational Therapy

## 2019-04-15 DIAGNOSIS — I10 Essential (primary) hypertension: Secondary | ICD-10-CM | POA: Diagnosis not present

## 2019-04-15 DIAGNOSIS — E538 Deficiency of other specified B group vitamins: Secondary | ICD-10-CM | POA: Diagnosis not present

## 2019-04-15 DIAGNOSIS — J449 Chronic obstructive pulmonary disease, unspecified: Secondary | ICD-10-CM | POA: Diagnosis not present

## 2019-04-15 DIAGNOSIS — E7849 Other hyperlipidemia: Secondary | ICD-10-CM | POA: Diagnosis not present

## 2019-04-15 DIAGNOSIS — D692 Other nonthrombocytopenic purpura: Secondary | ICD-10-CM | POA: Diagnosis not present

## 2019-04-15 DIAGNOSIS — Z85820 Personal history of malignant melanoma of skin: Secondary | ICD-10-CM | POA: Diagnosis not present

## 2019-04-15 DIAGNOSIS — M064 Inflammatory polyarthropathy: Secondary | ICD-10-CM | POA: Diagnosis not present

## 2019-04-15 DIAGNOSIS — E118 Type 2 diabetes mellitus with unspecified complications: Secondary | ICD-10-CM | POA: Diagnosis not present

## 2019-04-15 DIAGNOSIS — E1165 Type 2 diabetes mellitus with hyperglycemia: Secondary | ICD-10-CM | POA: Diagnosis not present

## 2019-04-15 DIAGNOSIS — Z8673 Personal history of transient ischemic attack (TIA), and cerebral infarction without residual deficits: Secondary | ICD-10-CM | POA: Diagnosis not present

## 2019-04-15 DIAGNOSIS — Z9889 Other specified postprocedural states: Secondary | ICD-10-CM | POA: Diagnosis not present

## 2019-04-15 DIAGNOSIS — N1831 Chronic kidney disease, stage 3a: Secondary | ICD-10-CM | POA: Diagnosis not present

## 2019-04-16 DIAGNOSIS — E119 Type 2 diabetes mellitus without complications: Secondary | ICD-10-CM | POA: Diagnosis not present

## 2019-04-17 ENCOUNTER — Ambulatory Visit: Payer: PPO | Admitting: Physical Therapy

## 2019-04-17 ENCOUNTER — Encounter: Payer: Self-pay | Admitting: Occupational Therapy

## 2019-04-17 ENCOUNTER — Other Ambulatory Visit: Payer: Self-pay

## 2019-04-17 ENCOUNTER — Encounter: Payer: Self-pay | Admitting: Physical Therapy

## 2019-04-17 ENCOUNTER — Ambulatory Visit: Payer: PPO | Admitting: Occupational Therapy

## 2019-04-17 DIAGNOSIS — M6281 Muscle weakness (generalized): Secondary | ICD-10-CM

## 2019-04-17 DIAGNOSIS — R278 Other lack of coordination: Secondary | ICD-10-CM

## 2019-04-17 DIAGNOSIS — R2681 Unsteadiness on feet: Secondary | ICD-10-CM

## 2019-04-17 NOTE — Therapy (Signed)
[place] MAIN [facility] SERVICES [address] [place], [place], [address] Phone: [phone]   Fax:  [phone]  Physical Therapy Treatment  Patient Details  Name: Isaac Stevens MRN: [id number] Date of Birth: 1935-06-20 Referring Provider (PT): Rachelle Hora, MD   Encounter Date: 04/17/2019  PT End of Session - 04/17/19 1148    Visit Number  3    Number of Visits  17    Date for PT Re-Evaluation  05/12/19    PT Start Time  1106    PT Stop Time  1145    PT Time Calculation (min)  39 min    Equipment Utilized During Treatment  Gait belt    Activity Tolerance  Patient tolerated treatment well    Behavior During Therapy  [facility] for tasks assessed/performed       Past Medical History:  Diagnosis Date  . CAFL (chronic airflow limitation) (HCC)   . CKD (chronic kidney disease)   . Diabetes mellitus without complication ([place])   . Hyperlipidemia   . Hypertension   . Rheumatoid arthritis [facility])     Past Surgical History:  Procedure Laterality Date  . TOE SURGERY     right foot    There were no vitals filed for this visit.  Subjective Assessment - 04/17/19 1113    Subjective  Patient reports doing okay; reports adherence with HEP and states that the exercise are going okay. He reports that he thinks he is getting a little stronger;    Pertinent History  Pt. is an 84 y.o. male who was initially admitted to [facility] for 24 hours following a CVA on 11/19/18. Pt. was discharged, and one week later was admitted to [facility] with a CVA presenting with right sided weakness. Pt. received inpatient therapy services, and [facility] rehab services upon discharge. He is now being referred for outpatient rehab. Patient has already started outpatient OT to address RUE weakness and ADLs. 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 He has also been referred to orthopedics in January 2021 due to RUE frozen shoulder/weakness and pain. He presents to therapy with  tripod base cane which he reports using intermittently to reduce fall risk. He denies any recent falls. He denies any numbness/tingling    Limitations  Standing;Walking    How long can you sit comfortably?  NA    How long can you stand comfortably?  5-10 min, with fatigue    How long can you walk comfortably?  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  No/denies    Multiple Pain Sites  No          TREATMENT:   Warm up on Nustep BUE/BLE level 2 x4 min (unbilled);   Standing with 2# ankle weights: -Hip flexion march x15 bilaterally; -Hip abduction x15 bilaterally; -Hip extension x15 bilaterally; -Heel/toe raises x15 reps bilaterally;  Seated with 2# ankle weight x10 reps bilaterally; Required min VCs for proper positioning and exercise technique for optimal strengthening;    NMR: Standing on airex pad: Alternate toe taps from airex to 4 inch step with 1 rail assist x10 reps bilaterally;  Standing one foot on airex, one foot on 4 inch step:  Unsupported standing 15 sec hold x1 rep;  Unsupported standing with head turns side/side  x5 reps each direction;   Feet apart: Side/side weight shift unsupported x10 reps; Forward/backward weight shift unsupported x10 reps; Eyes open 30 sec hold, eyes closed 15 sec hold x2 reps; Head turns side/side x5 reps, up/down x5 reps; Patient required min VCs for balance stability, including to increase trunk control for less loss of balance with smaller base of support; He exhibits increased lean to LLE (strong side) requiring cues for neutral weight shift;  Resisted walking 12.5# forward/backward x3 laps, side stepping x2 laps with tripod base cane with min A for safety and cues to increase step length for better dynamic balance safety;   Response to treatment: Tolerated well. Patient does report  moderate fatigue at end of session. He exhibits less impulsiveness this session but did require min A for safety. Patient also required min VCs for proper exercise technique and positioning for optimal strengthening and balance.                         PT Education - 04/17/19 1148    Education Details  balance/strength, HEP    Person(s) Educated  Patient    Methods  Explanation;Verbal cues    Comprehension  Verbalized understanding;Returned demonstration;Verbal cues required;Need further instruction       PT Short Term Goals - 03/17/19 1245      PT SHORT TERM GOAL #1   Title  Patient will be adherent to HEP at least 3x a week to improve functional strength and balance for better safety at home.    Time  4    Period  Weeks    Status  New    Target Date  04/14/19      PT SHORT TERM GOAL #2   Title  Patient will increase BLE gross strength to 4/5 particularly in right hip as to improve functional strength for independent gait, increased standing tolerance and increased ADL ability    Time  4    Period  Weeks    Status  New    Target Date  04/14/19      PT SHORT TERM GOAL #3   Title  Patient will be mod I for all bed mobility including rolling and supine to sit without difficulty to increase independence at home.    Time  4    Period  Weeks    Status  New    Target Date  04/14/19        PT Long Term Goals - 03/17/19 1248      PT LONG TERM GOAL #1   Title  Patient (> 28 years old) will complete five times sit to stand test in < 15 seconds indicating an increased LE strength and improved balance.    Time  8    Period  Weeks    Status  New    Target Date  05/12/19      PT LONG TERM GOAL #2   Title  Patient will demonstrate an improved Berg Balance Score of >48/56 as to demonstrate improved balance with ADLs such as sitting/standing and transfer balance and reduced fall risk.    Time  8    Period  Weeks    Status  New    Target Date  05/12/19      PT  LONG TERM GOAL #3   Title  Patient will increase six minute walk test distance to >1000 for progression to community ambulator and improve gait ability    Time  8    Period  Weeks    Status  New    Target Date  05/12/19      PT LONG TERM GOAL #4   Title  Patient will tolerate 5 seconds of single leg stance without loss of balance to improve ability to get in and out of shower safely.    Time  8    Period  Weeks    Status  New    Target Date  05/12/19            Plan - 04/17/19 1151    Clinical Impression Statement  Patient motivated and participated well within session. He reports adherence with HEP; He was instructed in advanced LE strengthening exercise. He does require min VCs for proper positioning/exercise technique for optimal strengthening. Patient does report increased fatigue with prolonged standing. Patient does require short seated rest breaks due to fatigue. He was instructed in advanced balance exercises. He did have difficulty with standing on airex pad requiring CGA to min A for safety; Patient exhibits increased lean to LLE (strong side) having difficulty shifting to RLE due to imbalance/weakness. He would benefit from addtional skilled PT intervention to improve strength, balance and mobility;    Personal Factors and Comorbidities  Comorbidity 3+;Age;Time since onset of injury/illness/exacerbation    Comorbidities  recent stroke, DM x2 (on medication), HTN (somewhat controlled), CKD, HLD, RA (worse in right hand)    Examination-Activity Limitations  Bed Mobility;Caring for Others;Carry;Dressing;Lift;Locomotion Level;Reach Overhead;Squat;Stairs;Stand;Transfers    Examination-Participation Restrictions  Church;Cleaning;Community Activity;Driving;Laundry;Meal Prep;School;Shop;Volunteer;Yard Work    Stability/Clinical Decision Making  Stable/Uncomplicated    Rehab Potential  Good    PT Frequency  2x / week    PT Duration  8 weeks    PT Treatment/Interventions  ADLs/Self  Care Home Management;Cryotherapy;Electrical Stimulation;Moist Heat;Gait training;Functional mobility Scientist, forensic;Therapeutic activities;Therapeutic exercise;Balance training;Neuromuscular re-education;Patient/family education;Orthotic Fit/Training;Energy conservation    PT Next Visit Plan  initiate HEP    PT Home Exercise Plan  will address next session    Consulted and Agree with Plan of Care  Patient;Family member/caregiver    Family Member Consulted  spouse       Patient will benefit from skilled therapeutic intervention in order to improve the following deficits and impairments:  Abnormal gait, Decreased balance, Decreased endurance, Decreased mobility, Difficulty walking, Impaired perceived functional ability, Cardiopulmonary status limiting activity, Decreased activity tolerance, Decreased safety awareness, Decreased strength  Visit Diagnosis: Muscle weakness (generalized)  Other lack of coordination  Unsteadiness on feet     Problem List Patient Active Problem List   Diagnosis Date Noted  . Stroke ([place]) 11/19/2018  . Syncope 10/21/2015  . Drug-induced pneumonitis 04/02/2015  . COPD (chronic obstructive pulmonary disease) ([place]) 04/02/2015  . Dyspnea 04/02/2015  . COPD exacerbation ([place]) 03/11/2015  . CAFL (chronic airflow limitation) ([place]) 08/14/2014  . HLD (hyperlipidemia) 08/14/2014  . BP (high blood pressure) 08/14/2014  . Diabetes mellitus type 2, uncontrolled ([place]) 08/14/2014  . Chronic kidney disease (CKD), stage III (moderate) 01/27/2014  . 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

## 2019-04-17 NOTE — Therapy (Signed)
[place] MAIN [facility] SERVICES [address] [place], [place], [address] Phone: [phone]   Fax:  [phone]  Occupational Therapy Treatment  Patient Details  Name: Isaac Stevens MRN: [id number] Date of Birth: 1935/06/06 Referring Provider (OT): Dorise Hiss, [place]   Encounter Date: 04/17/2019  OT End of Session - 04/17/19 1558    Visit Number  11    Number of Visits  24    Date for OT Re-Evaluation  05/28/19    Authorization Type  Progress report period starting 02/20/2019    OT Start Time  1145    OT Stop Time  1230    OT Time Calculation (min)  45 min    Activity Tolerance  Patient tolerated treatment well    Behavior During Therapy  [facility] for tasks assessed/performed       Past Medical History:  Diagnosis Date  . CAFL (chronic airflow limitation) (HCC)   . CKD (chronic kidney disease)   . Diabetes mellitus without complication ([place])   . Hyperlipidemia   . Hypertension   . Rheumatoid arthritis [facility])     Past Surgical History:  Procedure Laterality Date  . TOE SURGERY     right foot    There were no vitals filed for this visit.  Subjective Assessment - 04/17/19 1557    Subjective   Pt. reports that he wore his compression glove more since the last session.    Patient is accompanied by:  Family member    Pertinent History  Pt. is an 84 y.o. male who was initially admitted to [facility] for 24 hours following a CVA. Pt. was discharged, and one week later was admitted to [facility] with a CVA presenting with right sidedl weakness. Pt. received inpatient therapy services, and HHOT services upon discharge. Pt. is now ready for outpatient OT services.    Currently in Pain?  Yes    Pain Score  5     Pain Location  Shoulder    Pain Orientation  Right    Pain Descriptors / Indicators  Aching      OT TREATMENT    Neuro muscular re-education:  Pt. worked on using his right hand to grasp, and manipulate 2" x 1/2" Pt. Worked on turning the  discs in the palm of his hand. Pt. Was able to engage his thumb to attempt turning them, however was unable to engage his 2nd. Pt. Turned the discs primarily utilizing his wrist, and support from the table. Pt. required verbal cues, and cues for visual demonstration. Pt. worked on stacking discs at the tabletop surface with further challenging the UE reach when attempting to reach up to place the discs into the container at various elevated positions.  Therapeutic Exercise:   Pt. tolerated right shoulder UE AAROM with PROM stretching to the end range for shoulder flexion, abduction, AROM for right elbow flexion, extension. Pt. worked on [employer] at [employer], and increasing the incline wedge to work towards performing shoulder flexion against gravity. Support was required at the elbow. Pt. Worked on 3pt. Pinch strength with a red resistive clip. Pt. Was able to pinch the clip with support at his right hand. Pt. Required assist the reposition the clip when attempting to pick it up from the table.    Manual Therapy:  Pt. Tolerated retrograde massage to the right hand, and digits for edema control. Pt. responded well with decreased edema noted in the right hand following retrograde massage. Pt. Education was provided  about performing retrograde massage on himself with cues, and assist required. Retrograde massage was performed with his right forearm elevated.Pt. Was able to formulate gross composite fist following with the second digit unable to reach the [facility]. Pt. Tolerated soft tissue mobilizations for carpal, and metacarpal spread stretches to prepare the right hand for ROM, and edema control techniques. Manual techniques were performed independent of, and in preparation for ROM  Pt.'s right hand edema was improved today. Circumferential measurements at the wrist: 20 cm, MCPs: 23 cm. Pt. reports wearing the the edema compression glove more over the past few days. Pt. has 5/10 pain in the right shoulder  with abduction. pt. conitnues to present with limited proximal right shoulder AROM, RUE ROM, and limited [facility] skills. Pt. continues to work on improving RUE strength, and [facility] skills in order to be able to engage his dominant RUE for more daily self-care tasks including self feeding, grooming, and dressing tasks.                     OT Education - 04/17/19 1558    Education Details  Edema control, right UE ther ex.    Person(s) Educated  Patient    Methods  Explanation;Demonstration    Comprehension  Verbalized understanding;Returned demonstration          OT Long Term Goals - 04/14/19 1055      OT LONG TERM GOAL #1   Title  Pt. will increase Right shoulder AROM  in order to be able to reach into a cabinet.    Baseline  04/14/2019: 20 degrees of isolated flexion. Pt. is unable to reach up into cabinets.    Time  12    Period  Weeks    Status  On-going    Target Date  05/28/19      OT LONG TERM GOAL #2   Title  Pt. will increase right shoulder abduction to be able to independently wash his hair.    Baseline  04/14/2019  Pt. presents with limited abduction, and is unable to reach up to his head to wash his hair.    Time  12    Period  Weeks    Status  On-going    Target Date  05/28/19      OT LONG TERM GOAL #3   Title  Pt. will perform self-feeding with his right hand with minA.    Baseline  Pt. requires modA, and is able to assist with using a fork with the right hand  when eating an eggs.    Time  12    Period  Weeks    Status  On-going    Target Date  05/28/19      OT LONG TERM GOAL #4   Title  Pt. will increase right wrist extension by 10 degrees in preparation for reaching for a glass.    Baseline  04/14/2019: -15 degrees. Pt. has difficulty reaching for a glass.    Time  12    Period  Weeks    Status  On-going    Target Date  05/28/19      OT LONG TERM GOAL #5   Title  Pt. will independently use his right hand to wash his face.    Baseline  04/14/2019:  Pt. is now able to reach his face with his right hand. Pt.is unable to wash his face.    Time  12    Period  Weeks    Status  On-going    Target Date  05/28/19      OT LONG TERM GOAL #6   Title  Pt. will be independent with UE dressing    Baseline  04/14/2019 Pt. requires modA    Time  12    Period  Weeks    Status  On-going    Target Date  05/28/19      OT LONG TERM GOAL #7   Title  Pt. will require minA LE dressing    Baseline  04/14/2019: pt. requires minA donning socks. Pt. requires modA with pants, and shoes.    Time  12    Period  Weeks    Target Date  05/28/19            Plan - 04/17/19 1559    Clinical Impression Statement Pt.'s right hand edema was improved today. Circumferential measurements at the wrist: 20 cm, MCPs: 23 cm. Pt. reports wearing the the edema compression glove more over the past few days. Pt. has 5/10 pain in the right shoulder with abduction. pt. conitnues to present with limited proximal right shoulder AROM, RUE ROM, and limited [facility] skills. Pt. continues to work on improving RUE strength, and [facility] skills in order to be able to engage his dominant RUE for more daily self-care tasks including self feeding, grooming, and dressing tasks.    Occupational performance deficits (Please refer to evaluation for details):  ADL's;IADL's;Education    Rehab Potential  Good    Clinical Decision Making  Several treatment options, min-mod task modification necessary    Comorbidities Affecting Occupational Performance:  May have comorbidities impacting occupational performance    Modification or Assistance to Complete Evaluation   Min-Moderate modification of tasks or assist with assess necessary to complete eval    OT Frequency  2x / week    OT Duration  12 weeks    OT Treatment/Interventions  Self-care/ADL training;Patient/family education;Therapeutic activities;DME and/or AE instruction;Neuromuscular education;Therapeutic exercise    Consulted and Agree with Plan of  Care  Patient    Family Member Consulted  --       Patient will benefit from skilled therapeutic intervention in order to improve the following deficits and impairments:           Visit Diagnosis: Muscle weakness (generalized)  Other lack of coordination    Problem List Patient Active Problem List   Diagnosis Date Noted  . Stroke ([place]) 11/19/2018  . Syncope 10/21/2015  . Drug-induced pneumonitis 04/02/2015  . COPD (chronic obstructive pulmonary disease) ([place]) 04/02/2015  . Dyspnea 04/02/2015  . COPD exacerbation ([place]) 03/11/2015  . CAFL (chronic airflow limitation) ([place]) 08/14/2014  . HLD (hyperlipidemia) 08/14/2014  . BP (high blood pressure) 08/14/2014  . Diabetes mellitus type 2, uncontrolled ([place]) 08/14/2014  . Chronic kidney disease (CKD), stage III (moderate) 01/27/2014  . 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 [place], [place], [address] Phone: [phone]   Fax:  [phone]  Name: Isaac Stevens MRN: [id number] Date of Birth: May 11, 1935

## 2019-04-21 ENCOUNTER — Ambulatory Visit: Payer: PPO

## 2019-04-21 ENCOUNTER — Ambulatory Visit: Payer: PPO | Admitting: Occupational Therapy

## 2019-04-21 ENCOUNTER — Other Ambulatory Visit: Payer: Self-pay

## 2019-04-21 DIAGNOSIS — M6281 Muscle weakness (generalized): Secondary | ICD-10-CM

## 2019-04-21 DIAGNOSIS — R278 Other lack of coordination: Secondary | ICD-10-CM

## 2019-04-21 DIAGNOSIS — R2681 Unsteadiness on feet: Secondary | ICD-10-CM

## 2019-04-21 NOTE — Therapy (Signed)
[place] MAIN [facility] SERVICES [address] [place], [place], [address] Phone: [phone]   Fax:  [phone]  Physical Therapy Treatment  Patient Details  Name: Isaac Stevens MRN: [id number] Date of Birth: May 10, 1935 Referring Provider (PT): Rachelle Hora, MD   Encounter Date: 04/21/2019  PT End of Session - 04/21/19 0942    Visit Number  4    Number of Visits  17    Date for PT Re-Evaluation  05/12/19    PT Start Time  0939    PT Stop Time  1015    PT Time Calculation (min)  36 min    Equipment Utilized During Treatment  Gait belt    Activity Tolerance  Patient tolerated treatment well    Behavior During Therapy  [facility] for tasks assessed/performed       Past Medical History:  Diagnosis Date  . CAFL (chronic airflow limitation) (HCC)   . CKD (chronic kidney disease)   . Diabetes mellitus without complication ([place])   . Hyperlipidemia   . Hypertension   . Rheumatoid arthritis [facility])     Past Surgical History:  Procedure Laterality Date  . TOE SURGERY     right foot    There were no vitals filed for this visit.  Subjective Assessment - 04/21/19 0943    Subjective  Patient reports he is tired this morning and "feeling a little weak." He denies any falls or stumbles since last therapy session. No changes in health or medication. Reports "I'm trying" when asked about whether he has performed his HEP. No specific questions or concerns upon arrival.    Pertinent History  Pt. is an 84 y.o. male who was initially admitted to [facility] for 24 hours following a CVA on 11/19/18. Pt. was discharged, and one week later was admitted to [facility] with a CVA presenting with right sided weakness. Pt. received inpatient therapy services, and [facility] rehab services upon discharge. He is now being referred for outpatient rehab. Patient has already started outpatient OT to address RUE weakness and ADLs. 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 He has  also been referred to orthopedics in January 2021 due to RUE frozen shoulder/weakness and pain. He presents to therapy with tripod base cane which he reports using intermittently to reduce fall risk. He denies any recent falls. He denies any numbness/tingling    Limitations  Standing;Walking    How long can you sit comfortably?  NA    How long can you stand comfortably?  5-10 min, with fatigue    How long can you walk comfortably?  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  No/denies         TREATMENT   Ther-ex  Warm up on Nustep BUE/BLE level 2 x 5 min (unbilled);  Seated with 2# ankle weights: Marching x 15 bilateral; LAQ x 15 bilateral;  Seated clams with green tband x 15 bilateral Seated adductor ball squeeze x 15 bilateral; Seated HS curls with green tband x 15 bilateral;  Standing with 2# ankle weights: Hip flexion march x15 bilaterally; Hip abduction x15 bilaterally; Heel/toe raises x15 reps bilaterally;   Neuromuscular Re-education  Standing on airex pad: Alternating toe taps from airex to 4 inch step with 1/0 rail assist x 10  reps bilaterally;  Standing one foot on airex, one foot on 4 inch step no UE x 30s on each side, added horizontal head turns 30s on each side;  NBOS eyes closed x 30s; WBOS velcro dart toss using LUE, attempted RUE however pt is unable to perform x 10;   Pt educated throughout session about proper posture and technique with exercises. Improved exercise technique, movement at target joints, use of target muscles after min to mod verbal, visual, tactile cues.    Patient motivated and participated well within session. He arrived late so session was somewhat abbreviated. He was instructed in advanced LE strengthening and balance exercises during session. He does require min VCs for proper  positioning/exercise technique for optimal strengthening. He requires intermittent seated rest breaks provided due to fatigue. Pt struggles with balance on unstable surfaces. Attempted velcro dart toss with RUE however he does not have adequate coordination and power to perform. He would benefit from addtional skilled PT intervention to improve strength, balance and mobility.                         PT Short Term Goals - 03/17/19 1245      PT SHORT TERM GOAL #1   Title  Patient will be adherent to HEP at least 3x a week to improve functional strength and balance for better safety at home.    Time  4    Period  Weeks    Status  New    Target Date  04/14/19      PT SHORT TERM GOAL #2   Title  Patient will increase BLE gross strength to 4/5 particularly in right hip as to improve functional strength for independent gait, increased standing tolerance and increased ADL ability    Time  4    Period  Weeks    Status  New    Target Date  04/14/19      PT SHORT TERM GOAL #3   Title  Patient will be mod I for all bed mobility including rolling and supine to sit without difficulty to increase independence at home.    Time  4    Period  Weeks    Status  New    Target Date  04/14/19        PT Long Term Goals - 03/17/19 1248      PT LONG TERM GOAL #1   Title  Patient (> 42 years old) will complete five times sit to stand test in < 15 seconds indicating an increased LE strength and improved balance.    Time  8    Period  Weeks    Status  New    Target Date  05/12/19      PT LONG TERM GOAL #2   Title  Patient will demonstrate an improved Berg Balance Score of >48/56 as to demonstrate improved balance with ADLs such as sitting/standing and transfer balance and reduced fall risk.    Time  8    Period  Weeks    Status  New    Target Date  05/12/19      PT LONG TERM GOAL #3   Title  Patient will increase six minute walk test distance to >1000 for progression to  community ambulator and improve gait ability    Time  8    Period  Weeks    Status  New    Target Date  05/12/19  PT LONG TERM GOAL #4   Title  Patient will tolerate 5 seconds of single leg stance without loss of balance to improve ability to get in and out of shower safely.    Time  8    Period  Weeks    Status  New    Target Date  05/12/19            Plan - 04/21/19 0942    Clinical Impression Statement  Patient motivated and participated well within session. He arrived late so session was somewhat abbreviated. He was instructed in advanced LE strengthening and balance exercises during session. He does require min VCs for proper positioning/exercise technique for optimal strengthening. He requires intermittent seated rest breaks provided due to fatigue. Pt struggles with balance on unstable surfaces. Attempted velcro dart toss with RUE however he does not have adequate coordination and power to perform. He would benefit from addtional skilled PT intervention to improve strength, balance and mobility.    Personal Factors and Comorbidities  Comorbidity 3+;Age;Time since onset of injury/illness/exacerbation    Comorbidities  recent stroke, DM x2 (on medication), HTN (somewhat controlled), CKD, HLD, RA (worse in right hand)    Examination-Activity Limitations  Bed Mobility;Caring for Others;Carry;Dressing;Lift;Locomotion Level;Reach Overhead;Squat;Stairs;Stand;Transfers    Examination-Participation Restrictions  Church;Cleaning;Community Activity;Driving;Laundry;Meal Prep;School;Shop;Volunteer;Yard Work    Stability/Clinical Decision Making  Stable/Uncomplicated    Rehab Potential  Good    PT Frequency  2x / week    PT Duration  8 weeks    PT Treatment/Interventions  ADLs/Self Care Home Management;Cryotherapy;Electrical Stimulation;Moist Heat;Gait training;Functional mobility Scientist, forensic;Therapeutic activities;Therapeutic exercise;Balance training;Neuromuscular  re-education;Patient/family education;Orthotic Fit/Training;Energy conservation    PT Next Visit Plan  initiate HEP    PT Home Exercise Plan  will address next session    Consulted and Agree with Plan of Care  Patient;Family member/caregiver    Family Member Consulted  spouse       Patient will benefit from skilled therapeutic intervention in order to improve the following deficits and impairments:  Abnormal gait, Decreased balance, Decreased endurance, Decreased mobility, Difficulty walking, Impaired perceived functional ability, Cardiopulmonary status limiting activity, Decreased activity tolerance, Decreased safety awareness, Decreased strength  Visit Diagnosis: Muscle weakness (generalized)  Unsteadiness on feet     Problem List Patient Active Problem List   Diagnosis Date Noted  . Stroke ([place]) 11/19/2018  . Syncope 10/21/2015  . Drug-induced pneumonitis 04/02/2015  . COPD (chronic obstructive pulmonary disease) ([place]) 04/02/2015  . Dyspnea 04/02/2015  . COPD exacerbation ([place]) 03/11/2015  . CAFL (chronic airflow limitation) ([place]) 08/14/2014  . HLD (hyperlipidemia) 08/14/2014  . BP (high blood pressure) 08/14/2014  . Diabetes mellitus type 2, uncontrolled ([place]) 08/14/2014  . Chronic kidney disease (CKD), stage III (moderate) 01/27/2014  . 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

## 2019-04-22 ENCOUNTER — Encounter: Payer: PPO | Admitting: Occupational Therapy

## 2019-04-23 ENCOUNTER — Encounter: Payer: Self-pay | Admitting: Occupational Therapy

## 2019-04-23 NOTE — Therapy (Signed)
[place] MAIN [facility] SERVICES [address] [place], [place], [address] Phone: [phone]   Fax:  [phone]  Occupational Therapy Treatment  Patient Details  Name: Isaac Stevens MRN: [id number] Date of Birth: [birth year] Referring Provider (OT): Dorise Hiss, [place]   Encounter Date: 04/21/2019  OT End of Session - 04/23/19 1234    Visit Number  12    Number of Visits  24    Date for OT Re-Evaluation  05/28/19    Authorization Type  Progress report period starting 02/20/2019    OT Start Time  1015    OT Stop Time  1100    OT Time Calculation (min)  45 min    Activity Tolerance  Patient tolerated treatment well    Behavior During Therapy  [facility] for tasks assessed/performed       Past Medical History:  Diagnosis Date  . CAFL (chronic airflow limitation) (HCC)   . CKD (chronic kidney disease)   . Diabetes mellitus without complication ([place])   . Hyperlipidemia   . Hypertension   . Rheumatoid arthritis [facility])     Past Surgical History:  Procedure Laterality Date  . TOE SURGERY     right foot    There were no vitals filed for this visit.  Subjective Assessment - 04/23/19 1233    Subjective   Patient reports he hasn't worn the glove much in the last couple days.  These appointments are too early for me, its hard to get ready and get here.    Pertinent History  Pt. is an 84 y.o. male who was initially admitted to [facility] for 24 hours following a CVA. Pt. was discharged, and one week later was admitted to [facility] with a CVA presenting with right sidedl weakness. Pt. received inpatient therapy services, and HHOT services upon discharge. Pt. is now ready for outpatient OT services.    Special Tests  FOTO score: 43    Patient Stated Goals  To regain the use of his right arm.    Currently in Pain?  Yes    Pain Score  4     Pain Location  Shoulder    Pain Orientation  Right    Pain Descriptors / Indicators  Aching    Pain Type  Chronic pain     Pain Onset  More than a month ago    Pain Frequency  Intermittent        Patient reports Pain 4/10 at times during exercises,  difficulty with place and hold and controlling on the way down.  Supine:  Patient seen for manual skills for scapular retraction, protraction, ABD/ADD, upwards rotation followed by PROM of RUE all joints and planes.  AAROM with shoulder flexion to 90 degrees with place and hold for 3 secs with attempts to control motion when returning from flexion.  Active assistive shoulder protraction with cues and guiding as needed.  Decreased control for external perturbations to arm.  Active elbow extension with arm placed in 90 degrees of shoulder flexion, moving hand to forehead and extend.  All performed for 5 repetitions for 2-3 sets.    In Sitting with use of cane, patient performing forward reach with cues, side to side, circles forwards and backwards, patient to add to home program.    Neuromuscular Reeducation:  Manipulation of ball pegs removing, attempting to pick up from table but has difficulty.  Once handed the peg from therapist he is able to place into board and remove.  Focused on isolated finger movements to attempt turning objects.    Response to tx:   Patient with some mild pain in right shoulder with exercises.  Continues to work towards improving normal movement patterns to engage in daily tasks.  Added exercises with cane for RUE for home program.  Continues to demonstrate difficulty with manipulation skills.  Continue OT to maximize safety and independence in daily tasks.                       OT Education - 04/23/19 1234    Education Details  Edema control, right UE ther ex.    Person(s) Educated  Patient    Methods  Explanation;Demonstration    Comprehension  Verbalized understanding;Returned demonstration          OT Long Term Goals - 04/14/19 1055      OT LONG TERM GOAL #1   Title  Pt. will increase Right shoulder AROM  in  order to be able to reach into a cabinet.    Baseline  04/14/2019: 20 degrees of isolated flexion. Pt. is unable to reach up into cabinets.    Time  12    Period  Weeks    Status  On-going    Target Date  05/28/19      OT LONG TERM GOAL #2   Title  Pt. will increase right shoulder abduction to be able to independently wash his hair.    Baseline  04/14/2019  Pt. presents with limited abduction, and is unable to reach up to his head to wash his hair.    Time  12    Period  Weeks    Status  On-going    Target Date  05/28/19      OT LONG TERM GOAL #3   Title  Pt. will perform self-feeding with his right hand with minA.    Baseline  Pt. requires modA, and is able to assist with using a fork with the right hand  when eating an eggs.    Time  12    Period  Weeks    Status  On-going    Target Date  05/28/19      OT LONG TERM GOAL #4   Title  Pt. will increase right wrist extension by 10 degrees in preparation for reaching for a glass.    Baseline  04/14/2019: -15 degrees. Pt. has difficulty reaching for a glass.    Time  12    Period  Weeks    Status  On-going    Target Date  05/28/19      OT LONG TERM GOAL #5   Title  Pt. will independently use his right hand to wash his face.    Baseline  04/14/2019: Pt. is now able to reach his face with his right hand. Pt.is unable to wash his face.    Time  12    Period  Weeks    Status  On-going    Target Date  05/28/19      OT LONG TERM GOAL #6   Title  Pt. will be independent with UE dressing    Baseline  04/14/2019 Pt. requires modA    Time  12    Period  Weeks    Status  On-going    Target Date  05/28/19      OT LONG TERM GOAL #7   Title  Pt. will require minA LE dressing    Baseline  04/14/2019:  pt. requires minA donning socks. Pt. requires modA with pants, and shoes.    Time  12    Period  Weeks    Target Date  05/28/19            Plan - 04/23/19 1234    Clinical Impression Statement  Patient with some mild pain in right  shoulder with exercises.  Continues to work towards improving normal movement patterns to engage in daily tasks.  Added exercises with cane for RUE for home program.  Continues to demonstrate difficulty with manipulation skills.  Continue OT to maximize safety and independence in daily tasks.    Occupational performance deficits (Please refer to evaluation for details):  ADL's;IADL's;Education    Rehab Potential  Good    Clinical Decision Making  Several treatment options, min-mod task modification necessary    Comorbidities Affecting Occupational Performance:  May have comorbidities impacting occupational performance    Modification or Assistance to Complete Evaluation   Min-Moderate modification of tasks or assist with assess necessary to complete eval    OT Frequency  2x / week    OT Duration  12 weeks    OT Treatment/Interventions  Self-care/ADL training;Patient/family education;Therapeutic activities;DME and/or AE instruction;Neuromuscular education;Therapeutic exercise    Consulted and Agree with Plan of Care  Patient       Patient will benefit from skilled therapeutic intervention in order to improve the following deficits and impairments:           Visit Diagnosis: Muscle weakness (generalized)  Other lack of coordination    Problem List Patient Active Problem List   Diagnosis Date Noted  . Stroke ([place]) 11/19/2018  . Syncope 10/21/2015  . Drug-induced pneumonitis 04/02/2015  . COPD (chronic obstructive pulmonary disease) ([place]) 04/02/2015  . Dyspnea 04/02/2015  . COPD exacerbation ([place]) 03/11/2015  . CAFL (chronic airflow limitation) ([place]) 08/14/2014  . HLD (hyperlipidemia) 08/14/2014  . BP (high blood pressure) 08/14/2014  . Diabetes mellitus type 2, uncontrolled ([place]) 08/14/2014  . Chronic kidney disease (CKD), stage III (moderate) 01/27/2014  . 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 [place], [place], [address] Phone: [phone]   Fax:  [phone]  Name: Isaac Stevens MRN: [id number] Date of Birth: 1935/09/24

## 2019-04-24 ENCOUNTER — Other Ambulatory Visit: Payer: Self-pay

## 2019-04-24 ENCOUNTER — Ambulatory Visit: Payer: PPO

## 2019-04-24 ENCOUNTER — Ambulatory Visit: Payer: PPO | Admitting: Occupational Therapy

## 2019-04-24 ENCOUNTER — Encounter: Payer: Self-pay | Admitting: Occupational Therapy

## 2019-04-24 DIAGNOSIS — R278 Other lack of coordination: Secondary | ICD-10-CM

## 2019-04-24 DIAGNOSIS — M6281 Muscle weakness (generalized): Secondary | ICD-10-CM

## 2019-04-24 DIAGNOSIS — R2681 Unsteadiness on feet: Secondary | ICD-10-CM

## 2019-04-24 NOTE — Therapy (Signed)
[place] MAIN [facility] SERVICES [address] [place], [place], [address] Phone: [phone]   Fax:  [phone]  Physical Therapy Treatment  Patient Details  Name: Isaac Stevens MRN: [id number] Date of Birth: Jun 08, 1935 Referring Provider (PT): Rachelle Hora, MD   Encounter Date: 04/24/2019  PT End of Session - 04/24/19 1109    Visit Number  5    Number of Visits  17    Date for PT Re-Evaluation  05/12/19    PT Start Time  1101    PT Stop Time  1145    PT Time Calculation (min)  44 min    Equipment Utilized During Treatment  Gait belt    Activity Tolerance  Patient tolerated treatment well    Behavior During Therapy  [facility] for tasks assessed/performed       Past Medical History:  Diagnosis Date  . CAFL (chronic airflow limitation) (HCC)   . CKD (chronic kidney disease)   . Diabetes mellitus without complication ([place])   . Hyperlipidemia   . Hypertension   . Rheumatoid arthritis [facility])     Past Surgical History:  Procedure Laterality Date  . TOE SURGERY     right foot    There were no vitals filed for this visit.  Subjective Assessment - 04/24/19 1108    Subjective  Patient reports he is doing well today. He denies any falls or stumbles since last therapy session. No changes in health or medication. Denies pain at arrival. No specific questions or concerns upon arrival.    Pertinent History  Pt. is an 84 y.o. male who was initially admitted to [facility] for 24 hours following a CVA on 11/19/18. Pt. was discharged, and one week later was admitted to [facility] with a CVA presenting with right sided weakness. Pt. received inpatient therapy services, and [facility] rehab services upon discharge. He is now being referred for outpatient rehab. Patient has already started outpatient OT to address RUE weakness and ADLs. 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 He has also been referred to orthopedics in January 2021 due to RUE frozen  shoulder/weakness and pain. He presents to therapy with tripod base cane which he reports using intermittently to reduce fall risk. He denies any recent falls. He denies any numbness/tingling    Limitations  Standing;Walking    How long can you sit comfortably?  NA    How long can you stand comfortably?  5-10 min, with fatigue    How long can you walk comfortably?  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  No/denies         TREATMENT   Ther-ex  Warm up on Nustep BUE/BLE level 2 x 5 min (unbilled);  Seated with 3# ankle weights: Marching x 20 bilateral; LAQ x 20 bilateral;  Seated clams with green tband x 20 bilateral Seated adductor ball squeeze x 20 bilateral; Seated HS curls with green tband x 20 bilateral;  Standing with 3# ankle weights: Hip flexion march x 20 bilaterally; Hip abduction x 20 bilaterally; Heel raises x 20 reps bilaterally;  Sit to stand without UE support with LLE on 2" Airex pad to bias RLE x 10; 6" step ups alternating leading LE x 10 each with LUE support on // bars throughout;  Neuromuscular Re-education  Standing on airex pad: Alternating toe taps from airex to 4 inch step with no UE assist x 10 reps bilaterally;  Standing one foot on airex, one foot on 4 inch step no UE x 30s on each side,  Airex NBOS horizontal/vertical head turns 30s each;  NBOS eyes closed x 30s;   Pt educated throughout session about proper posture and technique with exercises. Improved exercise technique, movement at target joints, use of target muscles after min to mod verbal, visual, tactile cues.    Patient motivated and participated well within session. He was instructed in advanced LE strengthening and balance exercises during session. Ankle resistance increased today as well as repetition numbers. He does  require min VCs for proper positioning/exercise technique for optimal strengthening. He requires intermittent seated rest breaks provided due to fatigue. Pt struggles with balance on unstable surfaces. He would benefit from addtional skilled PT intervention to improve strength, balance and mobility.                         PT Short Term Goals - 03/17/19 1245      PT SHORT TERM GOAL #1   Title  Patient will be adherent to HEP at least 3x a week to improve functional strength and balance for better safety at home.    Time  4    Period  Weeks    Status  New    Target Date  04/14/19      PT SHORT TERM GOAL #2   Title  Patient will increase BLE gross strength to 4/5 particularly in right hip as to improve functional strength for independent gait, increased standing tolerance and increased ADL ability    Time  4    Period  Weeks    Status  New    Target Date  04/14/19      PT SHORT TERM GOAL #3   Title  Patient will be mod I for all bed mobility including rolling and supine to sit without difficulty to increase independence at home.    Time  4    Period  Weeks    Status  New    Target Date  04/14/19        PT Long Term Goals - 03/17/19 1248      PT LONG TERM GOAL #1   Title  Patient (> 62 years old) will complete five times sit to stand test in < 15 seconds indicating an increased LE strength and improved balance.    Time  8    Period  Weeks    Status  New    Target Date  05/12/19      PT LONG TERM GOAL #2   Title  Patient will demonstrate an improved Berg Balance Score of >48/56 as to demonstrate improved balance with ADLs such as sitting/standing and transfer balance and reduced fall risk.    Time  8    Period  Weeks    Status  New    Target Date  05/12/19      PT LONG TERM GOAL #3   Title  Patient will increase six minute walk test distance to >1000 for progression to community ambulator and improve gait ability    Time  8    Period  Weeks     Status  New    Target Date  05/12/19      PT LONG TERM GOAL #4   Title  Patient will tolerate 5 seconds of single leg stance without loss of balance to improve ability to get in and out of shower safely.    Time  8    Period  Weeks    Status  New    Target Date  05/12/19            Plan - 04/24/19 1109    Clinical Impression Statement  Patient motivated and participated well within session. He was instructed in advanced LE strengthening and balance exercises during session. Ankle resistance increased today as well as repetition numbers. He does require min VCs for proper positioning/exercise technique for optimal strengthening. He requires intermittent seated rest breaks provided due to fatigue. Pt struggles with balance on unstable surfaces. He would benefit from addtional skilled PT intervention to improve strength, balance and mobility.    Personal Factors and Comorbidities  Comorbidity 3+;Age;Time since onset of injury/illness/exacerbation    Comorbidities  recent stroke, DM x2 (on medication), HTN (somewhat controlled), CKD, HLD, RA (worse in right hand)    Examination-Activity Limitations  Bed Mobility;Caring for Others;Carry;Dressing;Lift;Locomotion Level;Reach Overhead;Squat;Stairs;Stand;Transfers    Examination-Participation Restrictions  Church;Cleaning;Community Activity;Driving;Laundry;Meal Prep;School;Shop;Volunteer;Yard Work    Stability/Clinical Decision Making  Stable/Uncomplicated    Rehab Potential  Good    PT Frequency  2x / week    PT Duration  8 weeks    PT Treatment/Interventions  ADLs/Self Care Home Management;Cryotherapy;Electrical Stimulation;Moist Heat;Gait training;Functional mobility Scientist, forensic;Therapeutic activities;Therapeutic exercise;Balance training;Neuromuscular re-education;Patient/family education;Orthotic Fit/Training;Energy conservation    PT Next Visit Plan  initiate HEP    PT Home Exercise Plan  will address next session    Consulted  and Agree with Plan of Care  Patient;Family member/caregiver    Family Member Consulted  spouse       Patient will benefit from skilled therapeutic intervention in order to improve the following deficits and impairments:  Abnormal gait, Decreased balance, Decreased endurance, Decreased mobility, Difficulty walking, Impaired perceived functional ability, Cardiopulmonary status limiting activity, Decreased activity tolerance, Decreased safety awareness, Decreased strength  Visit Diagnosis: Muscle weakness (generalized)  Unsteadiness on feet     Problem List Patient Active Problem List   Diagnosis Date Noted  . Stroke ([place]) 11/19/2018  . Syncope 10/21/2015  . Drug-induced pneumonitis 04/02/2015  . COPD (chronic obstructive pulmonary disease) ([place]) 04/02/2015  . Dyspnea 04/02/2015  . COPD exacerbation ([place]) 03/11/2015  . CAFL (chronic airflow limitation) ([place]) 08/14/2014  . HLD (hyperlipidemia) 08/14/2014  . BP (high blood pressure) 08/14/2014  . Diabetes mellitus type 2, uncontrolled ([place]) 08/14/2014  . Chronic kidney disease (CKD), stage III (moderate) 01/27/2014  . 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 [place], [place], [address] Phone: [phone]   Fax:  [phone]  Name: Isaac Stevens MRN: [id number] Date of Birth: [birth year]

## 2019-04-24 NOTE — Therapy (Signed)
[place] MAIN [facility] SERVICES [address] [place], [place], [address] Phone: [phone]   Fax:  [phone]  Occupational Therapy Treatment  Patient Details  Name: Isaac Stevens MRN: [id number] Date of Birth: 1936-01-27 Referring Provider (OT): Dorise Hiss, [place]   Encounter Date: 04/24/2019  OT End of Session - 04/24/19 1441    Visit Number  13    Number of Visits  24    Date for OT Re-Evaluation  05/28/19    Authorization Type  Progress report period starting 02/20/2019    OT Start Time  1100    OT Stop Time  1145    OT Time Calculation (min)  45 min    Activity Tolerance  Patient tolerated treatment well    Behavior During Therapy  [facility] for tasks assessed/performed       Past Medical History:  Diagnosis Date  . CAFL (chronic airflow limitation) (HCC)   . CKD (chronic kidney disease)   . Diabetes mellitus without complication ([place])   . Hyperlipidemia   . Hypertension   . Rheumatoid arthritis [facility])     Past Surgical History:  Procedure Laterality Date  . TOE SURGERY     right foot    There were no vitals filed for this visit.  Subjective Assessment - 04/24/19 1440    Subjective   Pt. reports that he is trying to wear edema gloves.    Patient is accompanied by:  Family member    Pertinent History  Pt. is an 84 y.o. male who was initially admitted to [facility] for 24 hours following a CVA. Pt. was discharged, and one week later was admitted to [facility] with a CVA presenting with right sidedl weakness. Pt. received inpatient therapy services, and HHOT services upon discharge. Pt. is now ready for outpatient OT services.    Currently in Pain?  No/denies       OT TREATMENT   Neuro muscular re-education:  Pt. worked on right hand [facility] skills grasping one inch resistive cubes alternating thumb opposition to the tip of the 2nd digit. The board was positioned flat at the tabletop surface. Pt. worked on pressing them back into place  while isolating 2nd digit.  Therapeutic Exercise:  Pt. Tolerated AAROM/AROM with the RUE. In all joint ranges with PROM to the end ranges. Pt. worked on right shoulder stabilization exercises in supine with his shoulders flexed to 90 degrees with his elbows extended. Pt. performed shoulder protraction with support at the he right elbow. Pt. Worked on shoulder flexion while stabilizing a foam brick between his hands while in supine with his elbows extended.  Manual Therapy:  Pt. Tolerated retrograde massage to the right hand, and digits for edema control. Pt. responded well with decreased edema noted in the right hand following retrograde massage. Pt. Education was provided about performing retrograde massage on himself with cues, and assist required. Retrograde massage was performed with his right forearm elevated.  Pt. was able to formulate gross composite fist following with the second digit unable to reach the [facility].   Pt. reports that he has been wearing the edema glove more over the past few days. Circumferential measurements at the wrist: 20 cm, MCPs: 24cm. Pt. continues to present with limited proximal right shoulder AROM, shoulder stabilization, RUE ROM, and limited [facility] skills. Pt. is able to perform basic hand to mouth patterns in preparation for self feeding tasks. Pt. continues to work on improving RUE strength, and [facility] skills in order  to be able to engage his dominant RUE for more daily self-care tasks including self feeding, grooming, and dressing tasks.                       OT Education - 04/24/19 1441    Education Details  Edema control, right UE ther ex.    Person(s) Educated  Patient    Methods  Explanation;Demonstration    Comprehension  Verbalized understanding;Returned demonstration          OT Long Term Goals - 04/14/19 1055      OT LONG TERM GOAL #1   Title  Pt. will increase Right shoulder AROM  in order to be able to reach into a cabinet.     Baseline  04/14/2019: 20 degrees of isolated flexion. Pt. is unable to reach up into cabinets.    Time  12    Period  Weeks    Status  On-going    Target Date  05/28/19      OT LONG TERM GOAL #2   Title  Pt. will increase right shoulder abduction to be able to independently wash his hair.    Baseline  04/14/2019  Pt. presents with limited abduction, and is unable to reach up to his head to wash his hair.    Time  12    Period  Weeks    Status  On-going    Target Date  05/28/19      OT LONG TERM GOAL #3   Title  Pt. will perform self-feeding with his right hand with minA.    Baseline  Pt. requires modA, and is able to assist with using a fork with the right hand  when eating an eggs.    Time  12    Period  Weeks    Status  On-going    Target Date  05/28/19      OT LONG TERM GOAL #4   Title  Pt. will increase right wrist extension by 10 degrees in preparation for reaching for a glass.    Baseline  04/14/2019: -15 degrees. Pt. has difficulty reaching for a glass.    Time  12    Period  Weeks    Status  On-going    Target Date  05/28/19      OT LONG TERM GOAL #5   Title  Pt. will independently use his right hand to wash his face.    Baseline  04/14/2019: Pt. is now able to reach his face with his right hand. Pt.is unable to wash his face.    Time  12    Period  Weeks    Status  On-going    Target Date  05/28/19      OT LONG TERM GOAL #6   Title  Pt. will be independent with UE dressing    Baseline  04/14/2019 Pt. requires modA    Time  12    Period  Weeks    Status  On-going    Target Date  05/28/19      OT LONG TERM GOAL #7   Title  Pt. will require minA LE dressing    Baseline  04/14/2019: pt. requires minA donning socks. Pt. requires modA with pants, and shoes.    Time  12    Period  Weeks    Target Date  05/28/19            Plan - 04/24/19 1442    Clinical  Impression Statement Pt. reports that he has been wearing the edema glove more over the past few days.  Circumferential measurements at the wrist: 20 cm, MCPs: 24cm. Pt. continues to present with limited proximal right shoulder AROM, shoulder stabilization, RUE ROM, and limited [facility] skills. Pt. is able to perform basic hand to mouth patterns in preparation for self feeding tasks. Pt. continues to work on improving RUE strength, and [facility] skills in order to be able to engage his dominant RUE for more daily self-care tasks including self feeding, grooming, and dressing tasks.     Occupational performance deficits (Please refer to evaluation for details):  ADL's;IADL's;Education    Rehab Potential  Good    Clinical Decision Making  Several treatment options, min-mod task modification necessary    Comorbidities Affecting Occupational Performance:  May have comorbidities impacting occupational performance    Modification or Assistance to Complete Evaluation   Min-Moderate modification of tasks or assist with assess necessary to complete eval    OT Frequency  2x / week    OT Duration  12 weeks    OT Treatment/Interventions  Self-care/ADL training;Patient/family education;Therapeutic activities;DME and/or AE instruction;Neuromuscular education;Therapeutic exercise    Consulted and Agree with Plan of Care  Patient       Patient will benefit from skilled therapeutic intervention in order to improve the following deficits and impairments:           Visit Diagnosis: Muscle weakness (generalized)  Other lack of coordination    Problem List Patient Active Problem List   Diagnosis Date Noted  . Stroke ([place]) 11/19/2018  . Syncope 10/21/2015  . Drug-induced pneumonitis 04/02/2015  . COPD (chronic obstructive pulmonary disease) ([place]) 04/02/2015  . Dyspnea 04/02/2015  . COPD exacerbation ([place]) 03/11/2015  . CAFL (chronic airflow limitation) ([place]) 08/14/2014  . HLD (hyperlipidemia) 08/14/2014  . BP (high blood pressure) 08/14/2014  . Diabetes mellitus type 2, uncontrolled ([place]) 08/14/2014  . Chronic  kidney disease (CKD), stage III (moderate) 01/27/2014  . 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 [place], [place], [address] Phone: [phone]   Fax:  [phone]  Name: MALLIE CAROSELLA MRN: [id number] Date of Birth: [birth year]

## 2019-04-28 ENCOUNTER — Encounter: Payer: Self-pay | Admitting: Occupational Therapy

## 2019-04-28 ENCOUNTER — Encounter: Payer: Self-pay | Admitting: Physical Therapy

## 2019-04-28 ENCOUNTER — Ambulatory Visit: Payer: PPO | Admitting: Physical Therapy

## 2019-04-28 ENCOUNTER — Other Ambulatory Visit: Payer: Self-pay

## 2019-04-28 ENCOUNTER — Ambulatory Visit: Payer: PPO | Admitting: Occupational Therapy

## 2019-04-28 DIAGNOSIS — R278 Other lack of coordination: Secondary | ICD-10-CM

## 2019-04-28 DIAGNOSIS — R2681 Unsteadiness on feet: Secondary | ICD-10-CM

## 2019-04-28 DIAGNOSIS — M6281 Muscle weakness (generalized): Secondary | ICD-10-CM | POA: Diagnosis not present

## 2019-04-28 NOTE — Therapy (Signed)
[place] MAIN [facility] SERVICES [address] [place], [place], [address] Phone: [phone]   Fax:  [phone]  Physical Therapy Treatment  Patient Details  Name: Isaac Stevens MRN: [id number] Date of Birth: [birth year] Referring Provider (PT): Rachelle Hora, MD   Encounter Date: 04/28/2019  PT End of Session - 04/28/19 0949    Visit Number  6    Number of Visits  17    Date for PT Re-Evaluation  05/12/19    PT Start Time  [id number]    PT Stop Time  1115    PT Time Calculation (min)  30 min    Equipment Utilized During Treatment  Gait belt    Activity Tolerance  Patient tolerated treatment well    Behavior During Therapy  [facility] for tasks assessed/performed       Past Medical History:  Diagnosis Date  . CAFL (chronic airflow limitation) (HCC)   . CKD (chronic kidney disease)   . Diabetes mellitus without complication (Isaac Stevens)   . Hyperlipidemia   . Hypertension   . Rheumatoid arthritis [facility])     Past Surgical History:  Procedure Laterality Date  . TOE SURGERY     right foot    There were no vitals filed for this visit.  Subjective Assessment - 04/28/19 0948    Subjective  Patient is doing well, he is late because the upstairs was busy today.    Pertinent History  Pt. is an 84 y.o. male who was initially admitted to [facility] for 24 hours following a CVA on 11/19/18. Pt. was discharged, and one week later was admitted to [facility] with a CVA presenting with right sided weakness. Pt. received inpatient therapy services, and [facility] rehab services upon discharge. He is now being referred for outpatient rehab. Patient has already started outpatient OT to address RUE weakness and ADLs. 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 He has also been referred to orthopedics in January 2021 due to RUE frozen shoulder/weakness and pain. He presents to therapy with tripod base cane which he reports using intermittently to reduce fall risk. He denies any  recent falls. He denies any numbness/tingling    Limitations  Standing;Walking    How long can you sit comfortably?  NA    How long can you stand comfortably?  5-10 min, with fatigue    How long can you walk comfortably?  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  No/denies    Pain Score  0-No pain    Pain Onset  More than a month ago       Treatment: Octane fitness x 5 mins L 4  Hooklying Abd/ER with BTB x 36 reps Bridges x 10 x 2  hooklying knees together and trunk rotation x 20  SLR x 10 x 2 BLE sidelying hip abd x 10 x 2 BLE  Pt educated throughout session about proper posture and technique with exercises. Improved exercise technique, movement at target joints, use of target muscles after min to mod verbal, visual, tactile cues.                        PT Education - 04/28/19 0949    Education Details  HEP    Person(s) Educated  Patient    Methods  Explanation    Comprehension  Verbalized understanding;Need further instruction       PT Short Term Goals - 03/17/19 1245      PT SHORT TERM GOAL #1   Title  Patient will be adherent to HEP at least 3x a week to improve functional strength and balance for better safety at home.    Time  4    Period  Weeks    Status  New    Target Date  04/14/19      PT SHORT TERM GOAL #2   Title  Patient will increase BLE gross strength to 4/5 particularly in right hip as to improve functional strength for independent gait, increased standing tolerance and increased ADL ability    Time  4    Period  Weeks    Status  New    Target Date  04/14/19      PT SHORT TERM GOAL #3   Title  Patient will be mod I for all bed mobility including rolling and supine to sit without difficulty to increase independence at home.    Time  4    Period  Weeks    Status  New    Target  Date  04/14/19        PT Long Term Goals - 03/17/19 1248      PT LONG TERM GOAL #1   Title  Patient (> 19 years old) will complete five times sit to stand test in < 15 seconds indicating an increased LE strength and improved balance.    Time  8    Period  Weeks    Status  New    Target Date  05/12/19      PT LONG TERM GOAL #2   Title  Patient will demonstrate an improved Berg Balance Score of >48/56 as to demonstrate improved balance with ADLs such as sitting/standing and transfer balance and reduced fall risk.    Time  8    Period  Weeks    Status  New    Target Date  05/12/19      PT LONG TERM GOAL #3   Title  Patient will increase six minute walk test distance to >1000 for progression to community ambulator and improve gait ability    Time  8    Period  Weeks    Status  New    Target Date  05/12/19      PT LONG TERM GOAL #4   Title  Patient will tolerate 5 seconds of single leg stance without loss of balance to improve ability to get in and out of shower safely.    Time  8    Period  Weeks    Status  New    Target Date  05/12/19            Plan - 04/28/19 0950    Clinical Impression Statement  Patient arrived late due to a busy area to enter the hospital. He arrives in wc and was instructed in safe transfers to get in and out of wc. He performs open chair exercises bilaterally with some fatigue with a second set. Patient reports no pain and needs mod VC for correct technique and position. Patient will continue to benefit from skilled PT to improve strength and mobility.    Personal Factors and Comorbidities  Comorbidity 3+;Age;Time since onset of injury/illness/exacerbation    Comorbidities  recent stroke, DM x2 (on medication), HTN (somewhat  controlled), CKD, HLD, RA (worse in right hand)    Examination-Activity Limitations  Bed Mobility;Caring for Others;Carry;Dressing;Lift;Locomotion Level;Reach Overhead;Squat;Stairs;Stand;Transfers    Examination-Participation  Restrictions  Church;Cleaning;Community Activity;Driving;Laundry;Meal Prep;School;Shop;Volunteer;Yard Work    Stability/Clinical Decision Making  Stable/Uncomplicated    Rehab Potential  Good    PT Frequency  2x / week    PT Duration  8 weeks    PT Treatment/Interventions  ADLs/Self Care Home Management;Cryotherapy;Electrical Stimulation;Moist Heat;Gait training;Functional mobility Scientist, forensic;Therapeutic activities;Therapeutic exercise;Balance training;Neuromuscular re-education;Patient/family education;Orthotic Fit/Training;Energy conservation    PT Next Visit Plan  initiate HEP    PT Home Exercise Plan  will address next session    Consulted and Agree with Plan of Care  Patient;Family member/caregiver    Family Member Consulted  spouse       Patient will benefit from skilled therapeutic intervention in order to improve the following deficits and impairments:  Abnormal gait, Decreased balance, Decreased endurance, Decreased mobility, Difficulty walking, Impaired perceived functional ability, Cardiopulmonary status limiting activity, Decreased activity tolerance, Decreased safety awareness, Decreased strength  Visit Diagnosis: Muscle weakness (generalized)  Unsteadiness on feet  Other lack of coordination     Problem List Patient Active Problem List   Diagnosis Date Noted  . Stroke ([place]) 11/19/2018  . Syncope 10/21/2015  . Drug-induced pneumonitis 04/02/2015  . COPD (chronic obstructive pulmonary disease) ([place]) 04/02/2015  . Dyspnea 04/02/2015  . COPD exacerbation ([place]) 03/11/2015  . CAFL (chronic airflow limitation) ([place]) 08/14/2014  . HLD (hyperlipidemia) 08/14/2014  . BP (high blood pressure) 08/14/2014  . Diabetes mellitus type 2, uncontrolled ([place]) 08/14/2014  . Chronic kidney disease (CKD), stage III (moderate) 01/27/2014  . 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

## 2019-04-28 NOTE — Therapy (Signed)
[place] MAIN [facility] SERVICES [address] [place], [place], [address] Phone: [phone]   Fax:  [phone]  Occupational Therapy Treatment  Patient Details  Name: Isaac Stevens MRN: [id number] Date of Birth: 07/14/1935 Referring Provider (OT): Dorise Hiss, [place]   Encounter Date: 04/28/2019  OT End of Session - 04/28/19 1038    Visit Number  14    Number of Visits  24    Date for OT Re-Evaluation  05/28/19    Authorization Type  Progress report period starting 02/20/2019    OT Start Time  1015    OT Stop Time  1100    OT Time Calculation (min)  45 min    Activity Tolerance  Patient tolerated treatment well    Behavior During Therapy  [facility] for tasks assessed/performed       Past Medical History:  Diagnosis Date  . CAFL (chronic airflow limitation) (HCC)   . CKD (chronic kidney disease)   . Diabetes mellitus without complication ([place])   . Hyperlipidemia   . Hypertension   . Rheumatoid arthritis [facility])     Past Surgical History:  Procedure Laterality Date  . TOE SURGERY     right foot    There were no vitals filed for this visit.  Subjective Assessment - 04/28/19 1036    Subjective   Pt. reports that he hasn't worn his edema glove in a few days.    Patient is accompanied by:  Family member    Pertinent History  Pt. is an 84 y.o. male who was initially admitted to [facility] for 24 hours following a CVA. Pt. was discharged, and one week later was admitted to [facility] with a CVA presenting with right sidedl weakness. Pt. received inpatient therapy services, and HHOT services upon discharge. Pt. is now ready for outpatient OT services.    Special Tests  FOTO score: 43    Patient Stated Goals  To regain the use of his right arm.    Currently in Pain?  No/denies      OT TREATMENT    Neuro muscular re-education:  Pt. worked on using his right hand to grasp, and manipulate 2" x 1/2" [place] style discs. Pt. Worked on turning the discs  in the palm of his hand. Pt. Had difficulty engaging his thumb, and 2nd digit when attempting to turn them. Pt. Turned the discs primarily utilizing his wrist, and support from the table. Pt. required verbal cues, and cues for visual demonstration. Pt. worked on stacking discs at the tabletop surface with further challenging the UE reach when attempting to reach up to place the discs into the container at various elevated positions with cues to avoid compensating with hiking his shoulder. Pt. worked on formulating a 3pt. grasp With his right hand while manipulating 1" circular spheres from a dish, and placing them onto a pegboard placed at a tabletop surface.  Therapeutic Exercise:  Pt. worked on [employer] at the tabletop, and increasing the incline wedge 35 degrees, followed by 50 degrees with support being required at the elbow. Pt. Worked on 3pt. Pinch strength with a red resistive clip. Pt. was able to pinch the clip with support at his right hand. Pt. required assist the reposition the clip when attempting to pick it up from the table.   Self-care:  Pt. worked on Mudlogger. Pt. writes with using his fingers, wrist, hand, and UE  as one unit when formulating the letters. Pt. formulated the  letters with 50% legibility in printed form. Pt. Utilized a larger width pen, however had difficulty formulating a tripod grasp.  Manual Therapy:  Pt. Tolerated retrograde massage to the right hand, and digits for edema control. Pt. responded well with decreased edema noted in the right hand following retrograde massage. Pt. Education was provided about performing retrograde massage on himself with cues, and assist required. Retrograde massage was performed with his right forearm elevated.Pt. was able to formulate gross composite fist following with the second digit unable to reach the [facility]. Pt. tolerated soft tissue mobilizations for carpal, and metacarpal spread stretches to prepare the right hand  for ROM, and edema control techniques. Manual techniques were performed independent of, and in preparation for ROM. Manual techniques were performed in preparation for, and independent of there. Ex.  Pt.'s right hand edema was improved today. Circumferential measurements at the wrist: 19.5cm, MCPs: 23 cm. Pt. reports not wearing the the edema compression glove over the past few days. Pt. continues to present with limited proximal right shoulder AROM, RUE ROM, and limited [facility] skills. Pt. Presents with increased compensation proximally with right shoulder hiking during shoulder exercises, and [place] tasks. Pt. Continues to work on Mudlogger, and formulating a tripod grasp on the pen.  Pt. continues to work on improving RUE strength, and [facility] skills in order to be able to engage his dominant RUE for more daily self-care tasks including self feeding, grooming, and dressing tasks.                          OT Education - 04/28/19 1038    Education Details  Edema control, right UE ther ex.    Person(s) Educated  Patient    Methods  Explanation;Demonstration    Comprehension  Verbalized understanding;Returned demonstration          OT Long Term Goals - 04/14/19 1055      OT LONG TERM GOAL #1   Title  Pt. will increase Right shoulder AROM  in order to be able to reach into a cabinet.    Baseline  04/14/2019: 20 degrees of isolated flexion. Pt. is unable to reach up into cabinets.    Time  12    Period  Weeks    Status  On-going    Target Date  05/28/19      OT LONG TERM GOAL #2   Title  Pt. will increase right shoulder abduction to be able to independently wash his hair.    Baseline  04/14/2019  Pt. presents with limited abduction, and is unable to reach up to his head to wash his hair.    Time  12    Period  Weeks    Status  On-going    Target Date  05/28/19      OT LONG TERM GOAL #3   Title  Pt. will perform self-feeding with his right hand with minA.     Baseline  Pt. requires modA, and is able to assist with using a fork with the right hand  when eating an eggs.    Time  12    Period  Weeks    Status  On-going    Target Date  05/28/19      OT LONG TERM GOAL #4   Title  Pt. will increase right wrist extension by 10 degrees in preparation for reaching for a glass.    Baseline  04/14/2019: -15 degrees. Pt. has difficulty  reaching for a glass.    Time  12    Period  Weeks    Status  On-going    Target Date  05/28/19      OT LONG TERM GOAL #5   Title  Pt. will independently use his right hand to wash his face.    Baseline  04/14/2019: Pt. is now able to reach his face with his right hand. Pt.is unable to wash his face.    Time  12    Period  Weeks    Status  On-going    Target Date  05/28/19      OT LONG TERM GOAL #6   Title  Pt. will be independent with UE dressing    Baseline  04/14/2019 Pt. requires modA    Time  12    Period  Weeks    Status  On-going    Target Date  05/28/19      OT LONG TERM GOAL #7   Title  Pt. will require minA LE dressing    Baseline  04/14/2019: pt. requires minA donning socks. Pt. requires modA with pants, and shoes.    Time  12    Period  Weeks    Target Date  05/28/19            Plan - 04/28/19 1040    Clinical Impression Statement  Pt.'s right hand edema was improved today. Circumferential measurements at the wrist: 19.5cm, MCPs: 23 cm. Pt. reports not wearing the the edema compression glove over the past few days. Pt. continues to present with limited proximal right shoulder AROM, RUE ROM, and limited [place] skills. Pt. Presents with increased compensation proximally with right shoulder hiking during shoulder exercises, and [place] tasks. Pt. Continues to work on Mudlogger, and formulating a tripod grasp on the pen.  Pt. continues to work on improving RUE strength, and [facility] skills in order to be able to engage his dominant RUE for more daily self-care tasks including self feeding, grooming,  and dressing tasks.    Occupational performance deficits (Please refer to evaluation for details):  ADL's;IADL's;Education    Body Structure / Function / Physical Skills  ADL;FMC;IADL;UE functional use    Rehab Potential  Good    Clinical Decision Making  Several treatment options, min-mod task modification necessary    Comorbidities Affecting Occupational Performance:  May have comorbidities impacting occupational performance    Modification or Assistance to Complete Evaluation   Min-Moderate modification of tasks or assist with assess necessary to complete eval    OT Frequency  2x / week    OT Duration  12 weeks    OT Treatment/Interventions  Self-care/ADL training;Patient/family education;Therapeutic activities;DME and/or AE instruction;Neuromuscular education;Therapeutic exercise    Consulted and Agree with Plan of Care  Patient       Patient will benefit from skilled therapeutic intervention in order to improve the following deficits and impairments:   Body Structure / Function / Physical Skills: ADL, FMC, IADL, UE functional use       Visit Diagnosis: Muscle weakness (generalized)  Other lack of coordination    Problem List Patient Active Problem List   Diagnosis Date Noted  . Stroke ([place]) 11/19/2018  . Syncope 10/21/2015  . Drug-induced pneumonitis 04/02/2015  . COPD (chronic obstructive pulmonary disease) ([place]) 04/02/2015  . Dyspnea 04/02/2015  . COPD exacerbation ([place]) 03/11/2015  . CAFL (chronic airflow limitation) ([place]) 08/14/2014  . HLD (hyperlipidemia) 08/14/2014  . BP (high blood pressure) 08/14/2014  .  Diabetes mellitus type 2, uncontrolled ([place]) 08/14/2014  . Chronic kidney disease (CKD), stage III (moderate) 01/27/2014  . 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 [place], [place], [address] Phone: [phone]    Fax:  [phone]  Name: EULALIO GIARD MRN: [id number] Date of Birth: 26-Sep-1935

## 2019-04-29 ENCOUNTER — Encounter: Payer: PPO | Admitting: Occupational Therapy

## 2019-05-01 ENCOUNTER — Encounter: Payer: Self-pay | Admitting: Occupational Therapy

## 2019-05-01 ENCOUNTER — Ambulatory Visit: Payer: PPO | Admitting: Occupational Therapy

## 2019-05-01 ENCOUNTER — Other Ambulatory Visit: Payer: Self-pay

## 2019-05-01 DIAGNOSIS — M6281 Muscle weakness (generalized): Secondary | ICD-10-CM

## 2019-05-01 NOTE — Therapy (Signed)
[place] MAIN [facility] SERVICES [address] [place], [place], [address] Phone: [phone]   Fax:  [phone]  Occupational Therapy Treatment  Patient Details  Name: Isaac Stevens MRN: [id number] Date of Birth: [birth year] Referring Provider (OT): Dorise Hiss, [place]   Encounter Date: 05/01/2019  OT End of Session - 05/01/19 1223    Visit Number  15    Number of Visits  24    Date for OT Re-Evaluation  05/28/19    Authorization Type  Progress report period starting 02/20/2019    OT Start Time  1200    OT Stop Time  1230    OT Time Calculation (min)  30 min    Activity Tolerance  Patient tolerated treatment well    Behavior During Therapy  [facility] for tasks assessed/performed       Past Medical History:  Diagnosis Date  . CAFL (chronic airflow limitation) (HCC)   . CKD (chronic kidney disease)   . Diabetes mellitus without complication ([place])   . Hyperlipidemia   . Hypertension   . Rheumatoid arthritis [facility])     Past Surgical History:  Procedure Laterality Date  . TOE SURGERY     right foot    There were no vitals filed for this visit.  Subjective Assessment - 05/01/19 1222    Subjective   Pt. reports that he hasn't worn his edema glove in a few days.    Patient is accompanied by:  Family member    Pertinent History  Pt. is an 84 y.o. male who was initially admitted to [facility] for 24 hours following a CVA. Pt. was discharged, and one week later was admitted to [facility] with a CVA presenting with right sidedl weakness. Pt. received inpatient therapy services, and HHOT services upon discharge. Pt. is now ready for outpatient OT services.    Special Tests  FOTO score: 43    Patient Stated Goals  To regain the use of his right arm.    Currently in Pain?  No/denies       OT TREATMENT    Therapeutic Exercise:  Pt. worked on AAROM at the tabletop, and increasing the incline wedge 35 degrees, followed by 50 degrees with support being  required at the elbow. Pt. Worked on pinch strengthening in the left hand for lateral, and 3pt. pinch using yellow, red, green, and blue resistive clips. Pt. worked on placing the clips at a horizontal angle. Tactile and verbal cues were required for eliciting the desired movement.  Manual Therapy:  Pt. Tolerated retrograde massage to the right hand, and digits for edema control. Pt. responded well with decreased edema noted in the right hand followingretrograde massage. Pt. Education was provided about performing retrograde massage on himself with cues, and assist required. Retrograde massage was performed with his right forearm elevated.Pt. was able to formulate gross composite fist following with the second digit unable to reach the [facility].Pt. tolerated soft tissue mobilizations for carpal, and metacarpal spread stretches to prepare the right hand for ROM, and edema control techniques.Manual techniques were performed independent of, and in preparation for ROM. Manual techniques were performed in preparation for, and independent of there. Ex.  Pt. Was late for the appointment today. Pt. Continues to make steady progress. Circumferential measurements at the wrist: 19.5cm, MCPs: 23 cm following retrograde massage. Pt. reports not wearing the the edema compression glove over the past few days. Pt. continues to present with limited proximal right shoulder AROM, RUE ROM, and  limited [facility] skills. Pt. Presents with increased compensation proximally with right shoulder hiking during shoulder exercises, and [place] tasks.  Pt. continues to work on improving RUE strength, and [facility] skills in order to be able to engage his dominant RUE for more daily self-care tasks including self feeding, grooming, and dressing tasks.                       OT Education - 05/01/19 1223    Education Details  Edema control, right UE ther ex.    Person(s) Educated  Patient    Methods  Explanation;Demonstration     Comprehension  Verbalized understanding;Returned demonstration          OT Long Term Goals - 04/14/19 1055      OT LONG TERM GOAL #1   Title  Pt. will increase Right shoulder AROM  in order to be able to reach into a cabinet.    Baseline  04/14/2019: 20 degrees of isolated flexion. Pt. is unable to reach up into cabinets.    Time  12    Period  Weeks    Status  On-going    Target Date  05/28/19      OT LONG TERM GOAL #2   Title  Pt. will increase right shoulder abduction to be able to independently wash his hair.    Baseline  04/14/2019  Pt. presents with limited abduction, and is unable to reach up to his head to wash his hair.    Time  12    Period  Weeks    Status  On-going    Target Date  05/28/19      OT LONG TERM GOAL #3   Title  Pt. will perform self-feeding with his right hand with minA.    Baseline  Pt. requires modA, and is able to assist with using a fork with the right hand  when eating an eggs.    Time  12    Period  Weeks    Status  On-going    Target Date  05/28/19      OT LONG TERM GOAL #4   Title  Pt. will increase right wrist extension by 10 degrees in preparation for reaching for a glass.    Baseline  04/14/2019: -15 degrees. Pt. has difficulty reaching for a glass.    Time  12    Period  Weeks    Status  On-going    Target Date  05/28/19      OT LONG TERM GOAL #5   Title  Pt. will independently use his right hand to wash his face.    Baseline  04/14/2019: Pt. is now able to reach his face with his right hand. Isaac Stevensis unable to wash his face.    Time  12    Period  Weeks    Status  On-going    Target Date  05/28/19      OT LONG TERM GOAL #6   Title  Pt. will be independent with UE dressing    Baseline  04/14/2019 Pt. requires modA    Time  12    Period  Weeks    Status  On-going    Target Date  05/28/19      OT LONG TERM GOAL #7   Title  Pt. will require minA LE dressing    Baseline  04/14/2019: pt. requires minA donning socks. Pt. requires modA  with pants, and shoes.    Time  12    Period  Weeks    Target Date  05/28/19            Plan - 05/01/19 1223    Clinical Impression Statement  Pt. Was late for the appointment today. Pt. Continues to make steady progress. Circumferential measurements at the wrist: 19.5cm, MCPs: 23 cm following retrograde massage. Pt. reports not wearing the the edema compression glove over the past few days. Pt. continues to present with limited proximal right shoulder AROM, RUE ROM, and limited [facility] skills. Pt. Presents with increased compensation proximally with right shoulder hiking during shoulder exercises, and [place] tasks.  Pt. continues to work on improving RUE strength, and [facility] skills in order to be able to engage his dominant RUE for more daily self-care tasks including self feeding, grooming, and dressing tasks.   Occupational performance deficits (Please refer to evaluation for details):  ADL's;IADL's;Education    Body Structure / Function / Physical Skills  ADL;FMC;IADL;UE functional use    Clinical Decision Making  Several treatment options, min-mod task modification necessary    Comorbidities Affecting Occupational Performance:  May have comorbidities impacting occupational performance    Modification or Assistance to Complete Evaluation   Min-Moderate modification of tasks or assist with assess necessary to complete eval    OT Frequency  2x / week    OT Duration  12 weeks    OT Treatment/Interventions  Self-care/ADL training;Patient/family education;Therapeutic activities;DME and/or AE instruction;Neuromuscular education;Therapeutic exercise    Consulted and Agree with Plan of Care  Patient    Family Member Consulted  wife       Patient will benefit from skilled therapeutic intervention in order to improve the following deficits and impairments:   Body Structure / Function / Physical Skills: ADL, FMC, IADL, UE functional use       Visit Diagnosis: Muscle weakness  (generalized)    Problem List Patient Active Problem List   Diagnosis Date Noted  . Stroke ([place]) 11/19/2018  . Syncope 10/21/2015  . Drug-induced pneumonitis 04/02/2015  . COPD (chronic obstructive pulmonary disease) ([place]) 04/02/2015  . Dyspnea 04/02/2015  . COPD exacerbation ([place]) 03/11/2015  . CAFL (chronic airflow limitation) ([place]) 08/14/2014  . HLD (hyperlipidemia) 08/14/2014  . BP (high blood pressure) 08/14/2014  . Diabetes mellitus type 2, uncontrolled ([place]) 08/14/2014  . Chronic kidney disease (CKD), stage III (moderate) 01/27/2014  . 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

## 2019-05-05 ENCOUNTER — Other Ambulatory Visit: Payer: Self-pay

## 2019-05-05 ENCOUNTER — Encounter: Payer: Self-pay | Admitting: Occupational Therapy

## 2019-05-05 ENCOUNTER — Ambulatory Visit: Payer: PPO | Admitting: Occupational Therapy

## 2019-05-05 ENCOUNTER — Ambulatory Visit: Payer: PPO

## 2019-05-05 DIAGNOSIS — M6281 Muscle weakness (generalized): Secondary | ICD-10-CM

## 2019-05-05 DIAGNOSIS — R278 Other lack of coordination: Secondary | ICD-10-CM

## 2019-05-05 DIAGNOSIS — R2681 Unsteadiness on feet: Secondary | ICD-10-CM

## 2019-05-05 NOTE — Therapy (Addendum)
[place] MAIN [facility] SERVICES [address] [place], [place], [address] Phone: [phone]   Fax:  [phone]  Occupational Therapy Treatment  Patient Details  Name: Isaac Stevens MRN: [id number] Date of Birth: 1935/07/06 Referring Provider (OT): Dorise Hiss, [place]   Encounter Date: 05/05/2019  OT End of Session - 05/05/19 1049    Visit Number  16    Number of Visits  24    Date for OT Re-Evaluation  05/28/19    Authorization Type FOTO  Progress report period starting 02/20/2019    OT Start Time  1015    OT Stop Time  1100    OT Time Calculation (min)  45 min    Activity Tolerance  Patient tolerated treatment well    Behavior During Therapy  [facility] for tasks assessed/performed       Past Medical History:  Diagnosis Date  . CAFL (chronic airflow limitation) (HCC)   . CKD (chronic kidney disease)   . Diabetes mellitus without complication ([place])   . Hyperlipidemia   . Hypertension   . Rheumatoid arthritis [facility])     Past Surgical History:  Procedure Laterality Date  . TOE SURGERY     right foot    There were no vitals filed for this visit.  Subjective Assessment - 05/05/19 1046    Subjective   Pt. reports that he hasn't worn his edema glove in 3 or 4    Patient is accompanied by:  Isaac Stevens    Pertinent History  Pt. is an 84 y.o. male who was initially admitted to [facility] for 24 hours following a CVA. Pt. was discharged, and one week later was admitted to [facility] with a CVA presenting with right sidedl weakness. Pt. received inpatient therapy services, and HHOT services upon discharge. Pt. is now ready for outpatient OT services.    Special Tests  FOTO score: 43    Patient Stated Goals  To regain the use of his right arm.    Currently in Pain?  No/denies      OT TREATMENT  Therapeutic Exercise:  Pt. Tolerated AROM followed by PROM  To the end range of motion in the RUE for shoulder flexion, abduction, horizontal abduction.  Pt. worked on hand to mouth patterns with the RUE in preparation for self-feeding, and  grooming skills with support proximally.  Manual Therapy:  Pt. Tolerated retrograde massage to the right hand, and digits for edema control. Pt. responded well with decreased edema noted in the right hand followingretrograde massage. Pt. Education was provided about performing retrograde massage on himself with cues, and assist required. Retrograde massage was performed with his right forearm elevated.Pt.was able to formulate gross composite fist following with the second digit unable to reach the [facility].Pt.tolerated soft tissue mobilizations for carpal, and metacarpal spread stretches to prepare the right hand for ROM, and edema control techniques.Manual techniques were performed independent of, and in preparation for ROM.Manual techniques were performed in preparation for, and independent of there. Ex.  Pt. Reports that he is now back to driving his Gator vehicle around his property. Pt. Reports being so active prior to his stroke. Right Circumferential measurements prior to treatment: wrist 22cm, MCPs: 25 cm, after treatment: wrist: 21cm, after: 23cm following retrograde massage. Pt. reportsnotwearing the edema compression glove over the past 3-4 days. Pt.continues to present with limited proximal right shoulder AROM, RUE ROM, and limited [facility] skills.Pt. Presents with increased compensation proximally with right shoulder hiking during shoulder exercises,  and [facility] tasks.Pt. continues to work on improving RUE strength, and [facility] skills in order to be able to engage his dominant RUE for more daily self-care tasks including self feeding, grooming, and dressing tasks.                    OT Education - 05/05/19 1048    Education Details  Edema control, right UE ther ex.    Person(s) Educated  Patient    Methods  Explanation;Demonstration    Comprehension  Verbalized understanding;Returned  demonstration          OT Long Term Goals - 04/14/19 1055      OT LONG TERM GOAL #1   Title  Pt. will increase Right shoulder AROM  in order to be able to reach into a cabinet.    Baseline  04/14/2019: 20 degrees of isolated flexion. Pt. is unable to reach up into cabinets.    Time  12    Period  Weeks    Status  On-going    Target Date  05/28/19      OT LONG TERM GOAL #2   Title  Pt. will increase right shoulder abduction to be able to independently wash his hair.    Baseline  04/14/2019  Pt. presents with limited abduction, and is unable to reach up to his head to wash his hair.    Time  12    Period  Weeks    Status  On-going    Target Date  05/28/19      OT LONG TERM GOAL #3   Title  Pt. will perform self-feeding with his right hand with minA.    Baseline  Pt. requires modA, and is able to assist with using a fork with the right hand  when eating an eggs.    Time  12    Period  Weeks    Status  On-going    Target Date  05/28/19      OT LONG TERM GOAL #4   Title  Pt. will increase right wrist extension by 10 degrees in preparation for reaching for a glass.    Baseline  04/14/2019: -15 degrees. Pt. has difficulty reaching for a glass.    Time  12    Period  Weeks    Status  On-going    Target Date  05/28/19      OT LONG TERM GOAL #5   Title  Pt. will independently use his right hand to wash his face.    Baseline  04/14/2019: Pt. is now able to reach his face with his right hand. Pt.is unable to wash his face.    Time  12    Period  Weeks    Status  On-going    Target Date  05/28/19      OT LONG TERM GOAL #6   Title  Pt. will be independent with UE dressing    Baseline  04/14/2019 Pt. requires modA    Time  12    Period  Weeks    Status  On-going    Target Date  05/28/19      OT LONG TERM GOAL #7   Title  Pt. will require minA LE dressing    Baseline  04/14/2019: pt. requires minA donning socks. Pt. requires modA with pants, and shoes.    Time  12    Period   Weeks    Target Date  05/28/19  Plan - 05/05/19 1050    Clinical Impression Statement  Pt. Reports that he is now back to driving his Gator vehicle around his property. Pt. Reports being so active prior to his stroke. Right Circumferential measurements prior to treatment: wrist 22cm, MCPs: 25 cm, after treatment: wrist: 21cm, after: 23cm following retrograde massage. Pt. reportsnotwearing the edema compression glove over the past 3-4 days. Pt.continues to present with limited proximal right shoulder AROM, RUE ROM, and limited [facility] skills.Pt. Presents with increased compensation proximally with right shoulder hiking during shoulder exercises, and [place] tasks.Pt. continues to work on improving RUE strength, and [facility] skills in order to be able to engage his dominant RUE for more daily self-care tasks including self feeding, grooming, and dressing tasks.   Occupational performance deficits (Please refer to evaluation for details):  ADL's;IADL's;Education    Body Structure / Function / Physical Skills  ADL;FMC;IADL;UE functional use    Rehab Potential  Good    Clinical Decision Making  Several treatment options, min-mod task modification necessary    Comorbidities Affecting Occupational Performance:  May have comorbidities impacting occupational performance    Modification or Assistance to Complete Evaluation   Min-Moderate modification of tasks or assist with assess necessary to complete eval    OT Frequency  2x / week    OT Duration  12 weeks    OT Treatment/Interventions  Self-care/ADL training;Patient/Isaac education;Therapeutic activities;DME and/or AE instruction;Neuromuscular education;Therapeutic exercise    Consulted and Agree with Plan of Care  Patient    Isaac Stevens Consulted  wife       Patient will benefit from skilled therapeutic intervention in order to improve the following deficits and impairments:   Body Structure / Function / Physical Skills: ADL, FMC, IADL, UE  functional use       Visit Diagnosis: Muscle weakness (generalized)  Other lack of coordination    Problem List Patient Active Problem List   Diagnosis Date Noted  . Stroke ([place]) 11/19/2018  . Syncope 10/21/2015  . Drug-induced pneumonitis 04/02/2015  . COPD (chronic obstructive pulmonary disease) ([place]) 04/02/2015  . Dyspnea 04/02/2015  . COPD exacerbation ([place]) 03/11/2015  . CAFL (chronic airflow limitation) ([place]) 08/14/2014  . HLD (hyperlipidemia) 08/14/2014  . BP (high blood pressure) 08/14/2014  . Diabetes mellitus type 2, uncontrolled ([place]) 08/14/2014  . Chronic kidney disease (CKD), stage III (moderate) 01/27/2014  . 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 [place], [place], [address] Phone: [phone]   Fax:  [phone]  Name: QUAVIS BANDUCCI MRN: [id number] Date of Birth: 1935-10-30

## 2019-05-05 NOTE — Therapy (Signed)
[place] MAIN [facility] SERVICES [address] [place], [place], [address] Phone: [phone]   Fax:  [phone]  Physical Therapy Treatment  Patient Details  Name: Isaac Stevens MRN: [id number] Date of Birth: 1935/12/15 Referring Provider (PT): Rachelle Hora, MD   Encounter Date: 05/05/2019  PT End of Session - 05/05/19 0938    Visit Number  7    Number of Visits  17    Date for PT Re-Evaluation  05/12/19    PT Start Time  0930    PT Stop Time  1015    PT Time Calculation (min)  45 min    Equipment Utilized During Treatment  Gait belt    Activity Tolerance  Patient tolerated treatment well    Behavior During Therapy  [facility] for tasks assessed/performed       Past Medical History:  Diagnosis Date  . CAFL (chronic airflow limitation) (HCC)   . CKD (chronic kidney disease)   . Diabetes mellitus without complication ([place])   . Hyperlipidemia   . Hypertension   . Rheumatoid arthritis [facility])     Past Surgical History:  Procedure Laterality Date  . TOE SURGERY     right foot    There were no vitals filed for this visit.  Subjective Assessment - 05/05/19 0931    Subjective  Patient is doing well today with the exception of feeling "a little weak." Denies any falls or stumbles since last therapy session. No pain upon arrival. No specific questions or concerns.    Pertinent History  Pt. is an 84 y.o. male who was initially admitted to [facility] for 24 hours following a CVA on 11/19/18. Pt. was discharged, and one week later was admitted to [facility] with a CVA presenting with right sided weakness. Pt. received inpatient therapy services, and [facility] rehab services upon discharge. He is now being referred for outpatient rehab. Patient has already started outpatient OT to address RUE weakness and ADLs. 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 He has also been referred to orthopedics in January 2021 due to RUE frozen shoulder/weakness and pain. He  presents to therapy with tripod base cane which he reports using intermittently to reduce fall risk. He denies any recent falls. He denies any numbness/tingling    Limitations  Standing;Walking    How long can you sit comfortably?  NA    How long can you stand comfortably?  5-10 min, with fatigue    How long can you walk comfortably?  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  No/denies    Pain Onset  --              TREATMENT   Ther-ex Warm up on Octane xRide L4 x 4 min for cardiovascular challenge and warm-up (unbilled);   Seated with 3# ankle weights: Marching x 20 bilateral; LAQ x 20 bilateral;  Seated clams with green tband x 20 bilateral Seated adductor ball squeeze x 20 bilateral; Seated HS curls with green tband x 20 bilateral;  Standing with 3# ankle weights: Hip flexion march x 20 bilaterally; Hip abduction x 20 bilaterally; Heel raises x 20 reps bilaterally;  Mini Squats with BUE support x 10; Sit to stand without UE support with LLE on 2" Airex pad to  bias RLE x 10; 6" step ups alternating leading LE x 10 each with LUE support on // bars throughout;    Neuromuscular Re-education 6" orange hurdle step-overs without UE support x 10 bilateral; Standing on airex pad alternatingtoe taps from airex to 6 inch step with no UE assist x 10 reps bilaterally;    Pt educated throughout session about proper posture and technique with exercises. Improved exercise technique, movement at target joints, use of target muscles after min to mod verbal, visual, tactile cues.   Patient motivated and participated well within session.He was instructed in advanced LE strengthening and balanceexercises during session. Initiated standing squats today which he performs well. He continues to demonstrate decreased RLE  strength compared to L side. He does require min VCs for proper positioning/exercise technique for optimal strengthening.He requires intermittent seated rest breaks provided due to fatigue. Pt struggles with balance on unstable surfaces. He would benefit from addtional skilled PT intervention to improve strength, balance and mobility.                       PT Short Term Goals - 03/17/19 1245      PT SHORT TERM GOAL #1   Title  Patient will be adherent to HEP at least 3x a week to improve functional strength and balance for better safety at home.    Time  4    Period  Weeks    Status  New    Target Date  04/14/19      PT SHORT TERM GOAL #2   Title  Patient will increase BLE gross strength to 4/5 particularly in right hip as to improve functional strength for independent gait, increased standing tolerance and increased ADL ability    Time  4    Period  Weeks    Status  New    Target Date  04/14/19      PT SHORT TERM GOAL #3   Title  Patient will be mod I for all bed mobility including rolling and supine to sit without difficulty to increase independence at home.    Time  4    Period  Weeks    Status  New    Target Date  04/14/19        PT Long Term Goals - 03/17/19 1248      PT LONG TERM GOAL #1   Title  Patient (> 85 years old) will complete five times sit to stand test in < 15 seconds indicating an increased LE strength and improved balance.    Time  8    Period  Weeks    Status  New    Target Date  05/12/19      PT LONG TERM GOAL #2   Title  Patient will demonstrate an improved Berg Balance Score of >48/56 as to demonstrate improved balance with ADLs such as sitting/standing and transfer balance and reduced fall risk.    Time  8    Period  Weeks    Status  New    Target Date  05/12/19      PT LONG TERM GOAL #3   Title  Patient will increase six minute walk test distance to >1000 for progression to community ambulator and improve gait ability     Time  8    Period  Weeks    Status  New    Target Date  05/12/19      PT LONG TERM GOAL #4  Title  Patient will tolerate 5 seconds of single leg stance without loss of balance to improve ability to get in and out of shower safely.    Time  8    Period  Weeks    Status  New    Target Date  05/12/19            Plan - 05/05/19 [id number]    Clinical Impression Statement  Patient motivated and participated well within session. He was instructed in advanced LE strengthening and balance exercises during session. Initiated standing squats today which he performs well. He continues to demonstrate decreased RLE strength compared to L side. He does require min VCs for proper positioning/exercise technique for optimal strengthening. He requires intermittent seated rest breaks provided due to fatigue. Pt struggles with balance on unstable surfaces. He would benefit from addtional skilled PT intervention to improve strength, balance and mobility.    Personal Factors and Comorbidities  Comorbidity 3+;Age;Time since onset of injury/illness/exacerbation    Comorbidities  recent stroke, DM x2 (on medication), HTN (somewhat controlled), CKD, HLD, RA (worse in right hand)    Examination-Activity Limitations  Bed Mobility;Caring for Others;Carry;Dressing;Lift;Locomotion Level;Reach Overhead;Squat;Stairs;Stand;Transfers    Examination-Participation Restrictions  Church;Cleaning;Community Activity;Driving;Laundry;Meal Prep;School;Shop;Volunteer;Yard Work    Stability/Clinical Decision Making  Stable/Uncomplicated    Rehab Potential  Good    PT Frequency  2x / week    PT Duration  8 weeks    PT Treatment/Interventions  ADLs/Self Care Home Management;Cryotherapy;Electrical Stimulation;Moist Heat;Gait training;Functional mobility Scientist, forensic;Therapeutic activities;Therapeutic exercise;Balance training;Neuromuscular re-education;Patient/family education;Orthotic Fit/Training;Energy conservation    PT Next  Visit Plan  continue with balance and strengthening    Consulted and Agree with Plan of Care  Patient;Family member/caregiver    Family Member Consulted  spouse       Patient will benefit from skilled therapeutic intervention in order to improve the following deficits and impairments:  Abnormal gait, Decreased balance, Decreased endurance, Decreased mobility, Difficulty walking, Impaired perceived functional ability, Cardiopulmonary status limiting activity, Decreased activity tolerance, Decreased safety awareness, Decreased strength  Visit Diagnosis: Muscle weakness (generalized)  Unsteadiness on feet     Problem List Patient Active Problem List   Diagnosis Date Noted  . Stroke ([place]) 11/19/2018  . Syncope 10/21/2015  . Drug-induced pneumonitis 04/02/2015  . COPD (chronic obstructive pulmonary disease) ([place]) 04/02/2015  . Dyspnea 04/02/2015  . COPD exacerbation ([place]) 03/11/2015  . CAFL (chronic airflow limitation) ([place]) 08/14/2014  . HLD (hyperlipidemia) 08/14/2014  . BP (high blood pressure) 08/14/2014  . Diabetes mellitus type 2, uncontrolled ([place]) 08/14/2014  . Chronic kidney disease (CKD), stage III (moderate) 01/27/2014  . 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 [place], [place], [address] Phone: [phone]   Fax:  [phone]  Name: Isaac Stevens MRN: [id number] Date of Birth: 07-May-1935

## 2019-05-06 ENCOUNTER — Encounter: Payer: PPO | Admitting: Occupational Therapy

## 2019-05-08 ENCOUNTER — Other Ambulatory Visit: Payer: Self-pay

## 2019-05-08 ENCOUNTER — Encounter: Payer: Self-pay | Admitting: Physical Therapy

## 2019-05-08 ENCOUNTER — Encounter: Payer: Self-pay | Admitting: Occupational Therapy

## 2019-05-08 ENCOUNTER — Ambulatory Visit: Payer: PPO | Attending: Orthopedic Surgery | Admitting: Occupational Therapy

## 2019-05-08 ENCOUNTER — Ambulatory Visit: Payer: PPO | Admitting: Physical Therapy

## 2019-05-08 DIAGNOSIS — R278 Other lack of coordination: Secondary | ICD-10-CM

## 2019-05-08 DIAGNOSIS — M6281 Muscle weakness (generalized): Secondary | ICD-10-CM

## 2019-05-08 DIAGNOSIS — R2681 Unsteadiness on feet: Secondary | ICD-10-CM

## 2019-05-08 NOTE — Therapy (Signed)
[place] MAIN [facility] SERVICES [address] [place], [place], [address] Phone: [phone]   Fax:  [phone]  Occupational Therapy Treatment  Patient Details  Name: Isaac Stevens MRN: [id number] Date of Birth: [birth year] Referring Provider (OT): Dorise Hiss, [place]   Encounter Date: 05/08/2019  OT End of Session - 05/08/19 1232    Visit Number  17    Number of Visits  24    Date for OT Re-Evaluation  05/28/19    Authorization Type  FOTO   Progress report period starting 02/20/2019    OT Start Time  1145    OT Stop Time  1230    OT Time Calculation (min)  45 min    Activity Tolerance  Patient tolerated treatment well    Behavior During Therapy  [facility] for tasks assessed/performed       Past Medical History:  Diagnosis Date  . CAFL (chronic airflow limitation) (HCC)   . CKD (chronic kidney disease)   . Diabetes mellitus without complication ([place])   . Hyperlipidemia   . Hypertension   . Rheumatoid arthritis [facility])     Past Surgical History:  Procedure Laterality Date  . TOE SURGERY     right foot    There were no vitals filed for this visit.  Subjective Assessment - 05/08/19 1231    Subjective   Pt reports that he is doing fine today but still has swelling in his hand.    Patient is accompanied by:  Family member    Pertinent History  Pt. is an 84 y.o. male who was initially admitted to [facility] for 24 hours following a CVA. Pt. was discharged, and one week later was admitted to [facility] with a CVA presenting with right sidedl weakness. Pt. received inpatient therapy services, and HHOT services upon discharge. Pt. is now ready for outpatient OT services.    Special Tests  FOTO score: 43    Patient Stated Goals  To regain the use of his right arm.    Currently in Pain?  No/denies       OT TREATMENT  Neuromuscular re-ed:  Pt. Tolerated AROM followed by PROM  To the end range of motion in the RUE for shoulder flexion, abduction,  horizontal abduction. Pt. worked on hand to mouth patterns with the RUE in preparation for self feeding.  Also worked on grasp and release of 1/2 inch and 1 inch washers to place on long wooden dowel with focus on thumb to index finger on R and not substitute with L hand scooping up the washer. rec he start wearing his compression glove since the edema is still in hand and wrist.  Self care skills: Pt given red foam utensil holder to work on using spoon at home for self feeding using R hand with easy foods like mashed potatoes.  Also rec using dycem or dycem like material to hold plate and bowl and reviewed adaptive equipment catalog for other options like swivel seat for car and extended handle for seat belt to increase independence for car transfers.  Pt. Reports that he is now back to driving his Gator vehicle around his property. Pt. Reports being so active prior to his stroke. Pt. reportsnotwearing the edema compression glove over the past several days. Pt.continues to present with limited proximal right shoulder AROM, RUE ROM, and limited [facility] skills.Pt. Presents with increased compensation proximally with right shoulder hiking during shoulder exercises, and [place] tasks.Pt. continues to work on improving RUE  strength, and [facility] skills in order to be able to engage his dominant RUE for more daily self-care tasks including self feeding, grooming, and dressing tasks                     OT Education - 05/08/19 1232    Education Details  Edema control, right UE ther ex.    Person(s) Educated  Patient    Methods  Explanation;Demonstration    Comprehension  Verbalized understanding;Returned demonstration          OT Long Term Goals - 04/14/19 1055      OT LONG TERM GOAL #1   Title  Pt. will increase Right shoulder AROM  in order to be able to reach into a cabinet.    Baseline  04/14/2019: 20 degrees of isolated flexion. Pt. is unable to reach up into cabinets.    Time  12     Period  Weeks    Status  On-going    Target Date  05/28/19      OT LONG TERM GOAL #2   Title  Pt. will increase right shoulder abduction to be able to independently wash his hair.    Baseline  04/14/2019  Pt. presents with limited abduction, and is unable to reach up to his head to wash his hair.    Time  12    Period  Weeks    Status  On-going    Target Date  05/28/19      OT LONG TERM GOAL #3   Title  Pt. will perform self-feeding with his right hand with minA.    Baseline  Pt. requires modA, and is able to assist with using a fork with the right hand  when eating an eggs.    Time  12    Period  Weeks    Status  On-going    Target Date  05/28/19      OT LONG TERM GOAL #4   Title  Pt. will increase right wrist extension by 10 degrees in preparation for reaching for a glass.    Baseline  04/14/2019: -15 degrees. Pt. has difficulty reaching for a glass.    Time  12    Period  Weeks    Status  On-going    Target Date  05/28/19      OT LONG TERM GOAL #5   Title  Pt. will independently use his right hand to wash his face.    Baseline  04/14/2019: Pt. is now able to reach his face with his right hand. Pt.is unable to wash his face.    Time  12    Period  Weeks    Status  On-going    Target Date  05/28/19      OT LONG TERM GOAL #6   Title  Pt. will be independent with UE dressing    Baseline  04/14/2019 Pt. requires modA    Time  12    Period  Weeks    Status  On-going    Target Date  05/28/19      OT LONG TERM GOAL #7   Title  Pt. will require minA LE dressing    Baseline  04/14/2019: pt. requires minA donning socks. Pt. requires modA with pants, and shoes.    Time  12    Period  Weeks    Target Date  05/28/19            Plan - 05/08/19 1232  Clinical Impression Statement  Pt. Reports that he is still driving his Gator vehicle around his property. Pt. Reports being so active prior to his stroke and expressed frustration with not being able to use his R hand very  well yet.. Pt. reports not wearing the edema compression glove over the past several days and was encouarged to wear it again as much as possible. Pt. continues to present with limited proximal right shoulder AROM, RUE ROM, and limited [facility] skills. Pt. Presents with increased compensation proximally with right shoulder hiking during shoulder exercises, and [place] tasks.  Pt. continues to work on improving RUE strength, and [facility] skills in order to be able to engage his dominant RUE for more daily self-care tasks including self feeding, grooming, and dressing tasks.    Occupational performance deficits (Please refer to evaluation for details):  ADL's;IADL's;Education    Body Structure / Function / Physical Skills  ADL;FMC;IADL;UE functional use    Rehab Potential  Good    Clinical Decision Making  Several treatment options, min-mod task modification necessary    Comorbidities Affecting Occupational Performance:  May have comorbidities impacting occupational performance    Modification or Assistance to Complete Evaluation   Min-Moderate modification of tasks or assist with assess necessary to complete eval    OT Frequency  2x / week    OT Duration  12 weeks    OT Treatment/Interventions  Self-care/ADL training;Patient/family education;Therapeutic activities;DME and/or AE instruction;Neuromuscular education;Therapeutic exercise    Consulted and Agree with Plan of Care  Patient       Patient will benefit from skilled therapeutic intervention in order to improve the following deficits and impairments:   Body Structure / Function / Physical Skills: ADL, FMC, IADL, UE functional use       Visit Diagnosis: Muscle weakness (generalized)  Other lack of coordination    Problem List Patient Active Problem List   Diagnosis Date Noted  . Stroke ([place]) 11/19/2018  . Syncope 10/21/2015  . Drug-induced pneumonitis 04/02/2015  . COPD (chronic obstructive pulmonary disease) ([place]) 04/02/2015  . Dyspnea  04/02/2015  . COPD exacerbation ([place]) 03/11/2015  . CAFL (chronic airflow limitation) ([place]) 08/14/2014  . HLD (hyperlipidemia) 08/14/2014  . BP (high blood pressure) 08/14/2014  . Diabetes mellitus type 2, uncontrolled ([place]) 08/14/2014  . Chronic kidney disease (CKD), stage III (moderate) 01/27/2014  . 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

## 2019-05-08 NOTE — Therapy (Signed)
Physical Therapy Treatment  Patient Details  Name: Isaac Stevens MRN: [id number] Date of Birth: 08-24-1935 Referring Provider (PT): Rachelle Hora, MD   Encounter Date: 05/08/2019  PT End of Session - 05/08/19 1115    Visit Number  8    Number of Visits  17    Date for PT Re-Evaluation  05/12/19    PT Start Time  1107    PT Stop Time  1145    PT Time Calculation (min)  38 min       Past Medical History:  Diagnosis Date  . CAFL (chronic airflow limitation) (HCC)   . CKD (chronic kidney disease)   . Diabetes mellitus without complication ([place])   . Hyperlipidemia   . Hypertension   . Rheumatoid arthritis [facility])     Past Surgical History:  Procedure Laterality Date  . TOE SURGERY     right foot    There were no vitals filed for this visit.  Subjective Assessment - 05/08/19 1115    Subjective  Patient is doing well today with the exception of feeling "a little weak." Denies any falls or stumbles since last therapy session. No pain upon arrival. No specific questions or concerns.    Pertinent History  Pt. is an 84 y.o. male who was initially admitted to [facility] for 24 hours following a CVA on 11/19/18. Pt. was discharged, and one week later was admitted to [facility] with a CVA presenting with right sided weakness. Pt. received inpatient therapy services, and [facility] rehab services upon discharge. He is now being referred for outpatient rehab. Patient has already started outpatient OT to address RUE weakness and ADLs. 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 He has also been referred to orthopedics in January 2021 due to RUE frozen shoulder/weakness and pain. He presents to therapy with tripod base cane which he reports using intermittently to reduce fall risk. He denies any recent falls. He denies any numbness/tingling    Limitations  Standing;Walking    How long can you sit comfortably?  NA    How long can you stand comfortably?  5-10 min, with fatigue    How long can you  walk comfortably?  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  No/denies    Pain Score  0-No pain    Pain Onset  More than a month ago         Ther-ex  Nu-step  x 5 mins  Hip flexion marches  x 10 bilateral; Sit to stand without UE support from regular height chair x 10 x 3 sets  Step ups to 6-inch stool x 20   Gait training with hurrycane x 100 feet with min A guard TM walking . 3 miles / hour x 2 mins , 3. 3 miles / hour x 7 mins  Patient needs occasional verbal cueing to improve posture and cueing to correctly perform exercises slowly, holding at end of range to increase motor firing of desired muscle to encourage fatigue. [place]                       PT Education - 05/08/19 1115    Education Details  HEP    Person(s) Educated  Patient    Methods  Explanation  Comprehension  Returned demonstration;Verbal cues required       PT Short Term Goals - 03/17/19 1245      PT SHORT TERM GOAL #1   Title  Patient will be adherent to HEP at least 3x a week to improve functional strength and balance for better safety at home.    Time  4    Period  Weeks    Status  New    Target Date  04/14/19      PT SHORT TERM GOAL #2   Title  Patient will increase BLE gross strength to 4/5 particularly in right hip as to improve functional strength for independent gait, increased standing tolerance and increased ADL ability    Time  4    Period  Weeks    Status  New    Target Date  04/14/19      PT SHORT TERM GOAL #3   Title  Patient will be mod I for all bed mobility including rolling and supine to sit without difficulty to increase independence at home.    Time  4    Period  Weeks    Status  New    Target Date  04/14/19        PT Long Term Goals - 03/17/19 1248      PT LONG TERM GOAL #1   Title  Patient (>  87 years old) will complete five times sit to stand test in < 15 seconds indicating an increased LE strength and improved balance.    Time  8    Period  Weeks    Status  New    Target Date  05/12/19      PT LONG TERM GOAL #2   Title  Patient will demonstrate an improved Berg Balance Score of >48/56 as to demonstrate improved balance with ADLs such as sitting/standing and transfer balance and reduced fall risk.    Time  8    Period  Weeks    Status  New    Target Date  05/12/19      PT LONG TERM GOAL #3   Title  Patient will increase six minute walk test distance to >1000 for progression to community ambulator and improve gait ability    Time  8    Period  Weeks    Status  New    Target Date  05/12/19      PT LONG TERM GOAL #4   Title  Patient will tolerate 5 seconds of single leg stance without loss of balance to improve ability to get in and out of shower safely.    Time  8    Period  Weeks    Status  New    Target Date  05/12/19            Plan - 05/08/19 1117    Clinical Impression Statement   Pt was able to perform all exercises today with CGA.Marland Kitchen Pt was able to perform all mobility  and strength exercises, demonstrating improvements in LE strength and stability.  Pt was able to complete gait on TM. Marland Kitchen  Pt requires verbal, visual and tactile cues during exercise in order to complete tasks with proper form and technique.  Pt would continue to benefit from skilled PT services in order to further strengthen LE's, improve static and dynamic balance, and improve coordination in order to increase functional mobility and decrease risk of falls   Personal Factors and Comorbidities  Comorbidity 3+;Age;Time since onset  of injury/illness/exacerbation    Comorbidities  recent stroke, DM x2 (on medication), HTN (somewhat controlled), CKD, HLD, RA (worse in right hand)    Examination-Activity Limitations  Bed Mobility;Caring for Others;Carry;Dressing;Lift;Locomotion Level;Reach  Overhead;Squat;Stairs;Stand;Transfers    Examination-Participation Restrictions  Church;Cleaning;Community Activity;Driving;Laundry;Meal Prep;School;Shop;Volunteer;Yard Work    Stability/Clinical Decision Making  Stable/Uncomplicated    Rehab Potential  Good    PT Frequency  2x / week    PT Duration  8 weeks    PT Treatment/Interventions  ADLs/Self Care Home Management;Cryotherapy;Electrical Stimulation;Moist Heat;Gait training;Functional mobility Scientist, forensic;Therapeutic activities;Therapeutic exercise;Balance training;Neuromuscular re-education;Patient/family education;Orthotic Fit/Training;Energy conservation    PT Next Visit Plan  continue with balance and strengthening    Consulted and Agree with Plan of Care  Patient;Family member/caregiver    Family Member Consulted  spouse       Patient will benefit from skilled therapeutic intervention in order to improve the following deficits and impairments:  Abnormal gait, Decreased balance, Decreased endurance, Decreased mobility, Difficulty walking, Impaired perceived functional ability, Cardiopulmonary status limiting activity, Decreased activity tolerance, Decreased safety awareness, Decreased strength  Visit Diagnosis: Muscle weakness (generalized)  Unsteadiness on feet  Other lack of coordination     Problem List Patient Active Problem List   Diagnosis Date Noted  . Stroke ([place]) 11/19/2018  . Syncope 10/21/2015  . Drug-induced pneumonitis 04/02/2015  . COPD (chronic obstructive pulmonary disease) ([place]) 04/02/2015  . Dyspnea 04/02/2015  . COPD exacerbation ([place]) 03/11/2015  . CAFL (chronic airflow limitation) ([place]) 08/14/2014  . HLD (hyperlipidemia) 08/14/2014  . BP (high blood pressure) 08/14/2014  . Diabetes mellitus type 2, uncontrolled ([place]) 08/14/2014  . Chronic kidney disease (CKD), stage III (moderate) 01/27/2014  . 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 [place], [place], [address] Phone: [phone]   Fax:  [phone]  Name: Isaac Stevens MRN: [id number] Date of Birth: [birth year]

## 2019-05-12 ENCOUNTER — Other Ambulatory Visit: Payer: Self-pay

## 2019-05-12 ENCOUNTER — Ambulatory Visit: Payer: PPO

## 2019-05-12 VITALS — BP 132/71 | HR 81

## 2019-05-12 DIAGNOSIS — R2681 Unsteadiness on feet: Secondary | ICD-10-CM

## 2019-05-12 DIAGNOSIS — M6281 Muscle weakness (generalized): Secondary | ICD-10-CM

## 2019-05-12 NOTE — Therapy (Signed)
[place] MAIN [facility] SERVICES [address] [place], [place], [address] Phone: [phone]   Fax:  [phone]  Physical Therapy Treatment/Recertification  Patient Details  Name: Isaac Stevens MRN: [id number] Date of Birth: [birth year] Referring Provider (PT): Rachelle Hora, MD   Encounter Date: 05/12/2019  PT End of Session - 05/12/19 1025    Visit Number  9    Number of Visits  33    Date for PT Re-Evaluation  07/07/19    PT Start Time  1020    PT Stop Time  1100    PT Time Calculation (min)  40 min    Equipment Utilized During Treatment  Gait belt    Activity Tolerance  Patient tolerated treatment well    Behavior During Therapy  [facility] for tasks assessed/performed       Past Medical History:  Diagnosis Date  . CAFL (chronic airflow limitation) (HCC)   . CKD (chronic kidney disease)   . Diabetes mellitus without complication ([place])   . Hyperlipidemia   . Hypertension   . Rheumatoid arthritis [facility])     Past Surgical History:  Procedure Laterality Date  . TOE SURGERY     right foot    Vitals:   05/12/19 1038  BP: 132/71  Pulse: 81  SpO2: 99%    Subjective Assessment - 05/12/19 1024    Subjective  Patient is doing well today. Denies any falls or stumbles since last therapy session. No pain upon arrival. No specific questions or concerns.    Pertinent History  Pt. is an 84 y.o. male who was initially admitted to [facility] for 24 hours following a CVA on 11/19/18. Pt. was discharged, and one week later was admitted to [facility] with a CVA presenting with right sided weakness. Pt. received inpatient therapy services, and [facility] rehab services upon discharge. He is now being referred for outpatient rehab. Patient has already started outpatient OT to address RUE weakness and ADLs. 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 He has also been referred to orthopedics in January 2021 due to RUE frozen shoulder/weakness and pain. He presents  to therapy with tripod base cane which he reports using intermittently to reduce fall risk. He denies any recent falls. He denies any numbness/tingling    Limitations  Standing;Walking    How long can you sit comfortably?  NA    How long can you stand comfortably?  5-10 min, with fatigue    How long can you walk comfortably?  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  No/denies         [facility] PT Assessment - 05/12/19 1032      Strength   Strength Assessment Site  Hip;Knee;Ankle    Right/Left Hip  Right;Left    Right Hip Flexion  4/5    Right Hip ABduction  4-/5    Right Hip ADduction  2+/5    Left Hip Flexion  5/5    Left Hip ABduction  4-/5    Left Hip ADduction  2+/5    Right/Left Knee  Right;Left    Right Knee Flexion  4/5    Right Knee Extension  4+/5    Left Knee Flexion  5/5    Left Knee Extension  5/5  Right/Left Ankle  Right;Left    Right Ankle Dorsiflexion  4/5    Left Ankle Dorsiflexion  5/5      6 Minute Walk- Baseline   6 Minute Walk- Baseline  yes      6 Minute walk- Post Test   6 Minute Walk Post Test  yes    BP (mmHg)  149/74    HR (bpm)  85    02 Sat (%RA)  100 %    Modified Borg Scale for Dyspnea  4- somewhat severe    Perceived Rate of Exertion (Borg)  13- Somewhat hard      6 minute walk test results    Aerobic Endurance Distance Walked  800    Endurance additional comments  with tripod base cane, min A for safety, less than 1000 feet is limited community ambulator      Standardized Balance Assessment   Five times sit to stand comments   16.3      Berg Balance Test   Sit to Stand  Able to stand without using hands and stabilize independently    Standing Unsupported  Able to stand safely 2 minutes    Sitting with Back Unsupported but Feet Supported on Floor or Stool  Able to sit safely and  securely 2 minutes    Stand to Sit  Sits safely with minimal use of hands    Transfers  Able to transfer safely, minor use of hands    Standing Unsupported with Eyes Closed  Able to stand 10 seconds safely    Standing Unsupported with Feet Together  Able to place feet together independently and stand 1 minute safely    From Standing, Reach Forward with Outstretched Arm  Can reach forward >12 cm safely (5")    From Standing Position, Pick up Object from Floor  Able to pick up shoe, needs supervision    From Standing Position, Turn to Look Behind Over each Shoulder  Looks behind one side only/other side shows less weight shift    Turn 360 Degrees  Able to turn 360 degrees safely one side only in 4 seconds or less    Standing Unsupported, Alternately Place Feet on Step/Stool  Able to stand independently and safely and complete 8 steps in 20 seconds    Standing Unsupported, One Foot in Front  Able to take small step independently and hold 30 seconds    Standing on One Leg  Tries to lift leg/unable to hold 3 seconds but remains standing independently    Total Score  47         TREATMENT   Ther-ex Updated outcome measures with patient including: 5TSTS: 16.3s (15.8s) BERG: 47/56 (40/56) 6MWT: 800' with minA+1 and tripod base cane Single leg balance: 1-2s on both sides, more limited on RLE; Bed mobility: modified independent Strength testing (see above);   Pt educated throughout session about proper posture and technique with exercises. Improved exercise technique, movement at target joints, use of target muscles after min to mod verbal, visual, tactile cues.   Patient motivated and participated well within session.Outcome measures and goals updated. 5TSTS is essentially unchanged since the initial evaluation. BERG improved from 40/56 to 47/56 today. 6MWT increased from 610' at initial evaluation to 800' today. Lower extremity strength is improving as tested with MMT. He is now  modified independent with bed mobility however his single leg balance remains limited, especially on his RLE. Will continue with advanced strengthening and balance exercises in future session. Pt  encouraged to continue his HEP however he is not entirely compliant with his home program. Will review more in depth at next session. Pt would benefit from addtional skilled PT intervention to improve strength, balance and mobility.                         PT Short Term Goals - 05/12/19 1026      PT SHORT TERM GOAL #1   Title  Patient will be adherent to HEP at least 3x a week to improve functional strength and balance for better safety at home.    Baseline  05/12/19: "I do it some of the time."    Time  4    Period  Weeks    Status  Partially Met    Target Date  06/09/19      PT SHORT TERM GOAL #2   Title  Patient will increase BLE gross strength to 4/5 particularly in right hip as to improve functional strength for independent gait, increased standing tolerance and increased ADL ability    Baseline  see visit note 05/12/19    Time  4    Period  Weeks    Status  Partially Met    Target Date  06/09/19      PT SHORT TERM GOAL #3   Title  Patient will be mod I for all bed mobility including rolling and supine to sit without difficulty to increase independence at home.    Baseline  05/12/19: Able to roll to both directions and move from sidelying to sitting, more challenging rolling to the R side and pushing up with RUE    Time  4    Period  Weeks    Status  Achieved    Target Date  --        PT Long Term Goals - 05/12/19 1026      PT LONG TERM GOAL #1   Title  Patient (> 7 years old) will complete five times sit to stand test in < 15 seconds indicating an increased LE strength and improved balance.    Baseline  05/12/19: 16.3s    Time  8    Period  Weeks    Status  On-going    Target Date  07/07/19      PT LONG TERM GOAL #2   Title  Patient will demonstrate an improved  Berg Balance Score of >48/56 as to demonstrate improved balance with ADLs such as sitting/standing and transfer balance and reduced fall risk.    Baseline  05/12/19: 47/56    Time  8    Period  Weeks    Status  Partially Met    Target Date  07/07/19      PT LONG TERM GOAL #3   Title  Patient will increase six minute walk test distance to >1000 for progression to community ambulator and improve gait ability    Baseline  05/12/19: 800'    Time  8    Period  Weeks    Status  Partially Met    Target Date  07/07/19      PT LONG TERM GOAL #4   Title  Patient will tolerate 5 seconds of single leg stance without loss of balance to improve ability to get in and out of shower safely.    Baseline  05/12/19: 1-2s    Time  8    Period  Weeks    Status  On-going  Target Date  07/07/19            Plan - 05/12/19 1025    Clinical Impression Statement  Patient motivated and participated well within session. Outcome measures and goals updated. 5TSTS is essentially unchanged since the initial evaluation. BERG improved from 40/56 to 47/56 today. 6MWT increased from 610' at initial evaluation to 800' today. Lower extremity strength is improving as tested with MMT. He is now modified independent with bed mobility however his single leg balance remains limited, especially on his RLE. Will continue with advanced strengthening and balance exercises in future session. Pt encouraged to continue his HEP however he is not entirely compliant with his home program. Will review more in depth at next session. Pt would benefit from addtional skilled PT intervention to improve strength, balance and mobility.    Personal Factors and Comorbidities  Comorbidity 3+;Age;Time since onset of injury/illness/exacerbation    Comorbidities  recent stroke, DM x2 (on medication), HTN (somewhat controlled), CKD, HLD, RA (worse in right hand)    Examination-Activity Limitations  Bed Mobility;Caring for  Others;Carry;Dressing;Lift;Locomotion Level;Reach Overhead;Squat;Stairs;Stand;Transfers    Examination-Participation Restrictions  Church;Cleaning;Community Activity;Driving;Laundry;Meal Prep;School;Shop;Volunteer;Yard Work    Stability/Clinical Decision Making  Stable/Uncomplicated    Rehab Potential  Good    PT Frequency  2x / week    PT Duration  8 weeks    PT Treatment/Interventions  ADLs/Self Care Home Management;Cryotherapy;Electrical Stimulation;Moist Heat;Gait training;Functional mobility Scientist, forensic;Therapeutic activities;Therapeutic exercise;Balance training;Neuromuscular re-education;Patient/family education;Orthotic Fit/Training;Energy conservation    PT Next Visit Plan  continue with balance and strengthening    Consulted and Agree with Plan of Care  Patient;Family member/caregiver    Family Member Consulted  spouse       Patient will benefit from skilled therapeutic intervention in order to improve the following deficits and impairments:  Abnormal gait, Decreased balance, Decreased endurance, Decreased mobility, Difficulty walking, Impaired perceived functional ability, Cardiopulmonary status limiting activity, Decreased activity tolerance, Decreased safety awareness, Decreased strength  Visit Diagnosis: Muscle weakness (generalized)  Unsteadiness on feet     Problem List Patient Active Problem List   Diagnosis Date Noted  . Stroke ([place]) 11/19/2018  . Syncope 10/21/2015  . Drug-induced pneumonitis 04/02/2015  . COPD (chronic obstructive pulmonary disease) ([place]) 04/02/2015  . Dyspnea 04/02/2015  . COPD exacerbation ([place]) 03/11/2015  . CAFL (chronic airflow limitation) ([place]) 08/14/2014  . HLD (hyperlipidemia) 08/14/2014  . BP (high blood pressure) 08/14/2014  . Diabetes mellitus type 2, uncontrolled ([place]) 08/14/2014  . Chronic kidney disease (CKD), stage III (moderate) 01/27/2014  . 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

## 2019-05-15 ENCOUNTER — Encounter: Payer: Self-pay | Admitting: Occupational Therapy

## 2019-05-15 ENCOUNTER — Other Ambulatory Visit: Payer: Self-pay

## 2019-05-15 ENCOUNTER — Ambulatory Visit: Payer: PPO | Admitting: Occupational Therapy

## 2019-05-15 ENCOUNTER — Ambulatory Visit: Payer: PPO

## 2019-05-15 DIAGNOSIS — R278 Other lack of coordination: Secondary | ICD-10-CM

## 2019-05-15 DIAGNOSIS — R2681 Unsteadiness on feet: Secondary | ICD-10-CM

## 2019-05-15 DIAGNOSIS — M6281 Muscle weakness (generalized): Secondary | ICD-10-CM

## 2019-05-15 NOTE — Therapy (Signed)
[place] MAIN [facility] SERVICES [address] [place], [place], [address] Phone: [phone]   Fax:  [phone]  Occupational Therapy Treatment  Patient Details  Name: RAYCEN LIBONATI MRN: [id number] Date of Birth: [birth year] Referring Provider (OT): Dorise Hiss, [place]   Encounter Date: 05/15/2019  OT End of Session - 05/15/19 1129    Visit Number  18    Number of Visits  24    Date for OT Re-Evaluation  05/28/19    Authorization Type  FOTO   Progress report period starting 02/20/2019    OT Start Time  1100    OT Stop Time  1145    OT Time Calculation (min)  45 min    Activity Tolerance  Patient tolerated treatment well    Behavior During Therapy  [facility] for tasks assessed/performed       Past Medical History:  Diagnosis Date  . CAFL (chronic airflow limitation) (HCC)   . CKD (chronic kidney disease)   . Diabetes mellitus without complication ([place])   . Hyperlipidemia   . Hypertension   . Rheumatoid arthritis [facility])     Past Surgical History:  Procedure Laterality Date  . TOE SURGERY     right foot    There were no vitals filed for this visit.  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 is an 84 y.o. male who was initially admitted to [facility] for 24 hours following a CVA. Pt. was discharged, and one week later was admitted to [facility] with a CVA presenting with right sidedl weakness. Pt. received inpatient therapy services, and HHOT services upon discharge. Pt. is now ready for outpatient OT services.    Special Tests  FOTO score: 43    Patient Stated Goals  To regain the use of his right arm.    Currently in Pain?  No/denies    Pain Score  0-No pain         [facility] OT Assessment - 05/15/19 1122      Coordination   Right 9 Hole Peg Test  2 min  5 sec      Hand Function   Right Hand Grip (lbs)  18    Right Hand Lateral Pinch  5 lbs    Right Hand 3 Point Pinch  6 lbs      Patient seen for reassessment of right hand function, please refer to flowsheet for details.    ROM with therapist guiding AAROM for shoulder flexion, ABD, elbow flexion, extension, hand to mouth patterns.  Grasping and releasing with resistive 1 inch velcro squares, cues for prehension pattern with index and thumb on each side of the block.  Then worked towards removing by loop with index with cues to bend at DIP joint to grasp loop.  Patient seen for index flexion place and hold for 3-5 secs.  Followed by finger extension, multiple trials completed. Opposition to index and MF only, unable to perform RF or SF Patient has been using left hand only for self feeding, encouraged patient to use right hand with red foam on handle to complete a portion of self feeding to reinforce hand to mouth patterns.   Response to tx: Patient has continued to make good progress, encouraged patient to use  hand more for daily tasks, especially with self feeding and hand to mouth patterns which will also help with grooming.  Patient improved 9 hole peg test by about a minute.  Grip significantly improved now to 18#.  Pinch remained about the same as measured last.  Patient continues to benefit from skilled OT services to maximize safety and independence in daily tasks.                              OT Education - 05/15/19 1129    Education Details  RUE ROM, prehension patterns.    Person(s) Educated  Patient    Methods  Explanation;Demonstration    Comprehension  Verbalized understanding;Returned demonstration          OT Long Term Goals - 04/14/19 1055      OT LONG TERM GOAL #1   Title  Pt. will increase Right shoulder AROM  in order to be able to reach into a cabinet.    Baseline  04/14/2019: 20 degrees of isolated flexion. Pt. is unable to reach up into  cabinets.    Time  12    Period  Weeks    Status  On-going    Target Date  05/28/19      OT LONG TERM GOAL #2   Title  Pt. will increase right shoulder abduction to be able to independently wash his hair.    Baseline  04/14/2019  Pt. presents with limited abduction, and is unable to reach up to his head to wash his hair.    Time  12    Period  Weeks    Status  On-going    Target Date  05/28/19      OT LONG TERM GOAL #3   Title  Pt. will perform self-feeding with his right hand with minA.    Baseline  Pt. requires modA, and is able to assist with using a fork with the right hand  when eating an eggs.    Time  12    Period  Weeks    Status  On-going    Target Date  05/28/19      OT LONG TERM GOAL #4   Title  Pt. will increase right wrist extension by 10 degrees in preparation for reaching for a glass.    Baseline  04/14/2019: -15 degrees. Pt. has difficulty reaching for a glass.    Time  12    Period  Weeks    Status  On-going    Target Date  05/28/19      OT LONG TERM GOAL #5   Title  Pt. will independently use his right hand to wash his face.    Baseline  04/14/2019: Pt. is now able to reach his face with his right hand. Pt.is unable to wash his face.    Time  12    Period  Weeks    Status  On-going    Target Date  05/28/19      OT LONG TERM GOAL #6   Title  Pt. will be independent with UE dressing    Baseline  04/14/2019 Pt. requires modA    Time  12    Period  Weeks    Status  On-going    Target Date  05/28/19      OT LONG TERM GOAL #7   Title  Pt. will require minA LE dressing    Baseline  04/14/2019: pt.  requires minA donning socks. Pt. requires modA with pants, and shoes.    Time  12    Period  Weeks    Target Date  05/28/19            Plan - 05/15/19 1130    Clinical Impression Statement  Patient has continued to make good progress, encouraged patient to use hand more for daily tasks, especially with self feeding and hand to mouth patterns which will also  help with grooming.  Patient improved 9 hole peg test by about a minute.  Grip significantly improved now to 18#.  Pinch remained about the same as measured last.  Patient continues to benefit from skilled OT services to maximize safety and independence in daily tasks.    Occupational performance deficits (Please refer to evaluation for details):  ADL's;IADL's;Education    Body Structure / Function / Physical Skills  ADL;FMC;IADL;UE functional use;Dexterity;Strength;ROM;Coordination    Rehab Potential  Good    Clinical Decision Making  Several treatment options, min-mod task modification necessary    Comorbidities Affecting Occupational Performance:  May have comorbidities impacting occupational performance    Modification or Assistance to Complete Evaluation   Min-Moderate modification of tasks or assist with assess necessary to complete eval    OT Frequency  2x / week    OT Duration  12 weeks    OT Treatment/Interventions  Self-care/ADL training;Patient/family education;Therapeutic activities;DME and/or AE instruction;Neuromuscular education;Therapeutic exercise    Consulted and Agree with Plan of Care  Patient       Patient will benefit from skilled therapeutic intervention in order to improve the following deficits and impairments:   Body Structure / Function / Physical Skills: ADL, FMC, IADL, UE functional use, Dexterity, Strength, ROM, Coordination       Visit Diagnosis: Muscle weakness (generalized)  Unsteadiness on feet  Other lack of coordination    Problem List Patient Active Problem List   Diagnosis Date Noted  . Stroke ([place]) 11/19/2018  . Syncope 10/21/2015  . Drug-induced pneumonitis 04/02/2015  . COPD (chronic obstructive pulmonary disease) ([place]) 04/02/2015  . Dyspnea 04/02/2015  . COPD exacerbation ([place]) 03/11/2015  . CAFL (chronic airflow limitation) ([place]) 08/14/2014  . HLD (hyperlipidemia) 08/14/2014  . BP (high blood pressure) 08/14/2014  . Diabetes mellitus  type 2, uncontrolled ([place]) 08/14/2014  . Chronic kidney disease (CKD), stage III (moderate) 01/27/2014  . 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 [place], [place], [address] Phone: [phone]   Fax:  [phone]  Name: REMBERTO LINEN MRN: [id number] Date of Birth: 1935/02/14

## 2019-05-15 NOTE — Therapy (Signed)
[place] MAIN [facility] SERVICES [address] [place], [place], [address] Phone: [phone]   Fax:  [phone]  Physical Therapy Treatment Physical Therapy Progress Note   Dates of reporting period  03/17/19  to   05/15/19   Patient Details  Name: Isaac Stevens MRN: [id number] Date of Birth: [birth year] Referring Provider (PT): Rachelle Hora, MD   Encounter Date: 05/15/2019  PT End of Session - 05/15/19 1041    Visit Number  10    Number of Visits  33    Date for PT Re-Evaluation  07/07/19    Authorization Type  Pn 05/15/19    PT Start Time  1019    PT Stop Time  1100    PT Time Calculation (min)  41 min    Equipment Utilized During Treatment  Gait belt    Activity Tolerance  Patient tolerated treatment well    Behavior During Therapy  WFL for tasks assessed/performed       Past Medical History:  Diagnosis Date  . CAFL (chronic airflow limitation) (HCC)   . CKD (chronic kidney disease)   . Diabetes mellitus without complication ([place])   . Hyperlipidemia   . Hypertension   . Rheumatoid arthritis [facility])     Past Surgical History:  Procedure Laterality Date  . TOE SURGERY     right foot    There were no vitals filed for this visit.  Subjective Assessment - 05/15/19 1025    Subjective  Patient reports no falls or LOB, reports this morning is going slow. No pain.    Pertinent History  Pt. is an 84 y.o. male who was initially admitted to [facility] for 24 hours following a CVA on 11/19/18. Pt. was discharged, and one week later was admitted to [facility] with a CVA presenting with right sided weakness. Pt. received inpatient therapy services, and [facility] rehab services upon discharge. He is now being referred for outpatient rehab. Patient has already started outpatient OT to address RUE weakness and ADLs. 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 He has also been referred to orthopedics in January 2021 due to RUE frozen shoulder/weakness and  pain. He presents to therapy with tripod base cane which he reports using intermittently to reduce fall risk. He denies any recent falls. He denies any numbness/tingling    Limitations  Standing;Walking    How long can you sit comfortably?  NA    How long can you stand comfortably?  5-10 min, with fatigue    How long can you walk comfortably?  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  No/denies               Ther-ex  Warm up on Octane xRide L4 x 31mn for cardiovascular challenge    Seated with 3# ankle weights: Marching x 20 bilateral; LAQ x 20 bilateral;   Seated LAQ with rainbow ball adduction between feet 15x   Standing with 3# ankle weights: Hip flexion march x 20 bilaterally; Hip abduction x 15 each LE  Hip extension x15 each LE Heel raises x 20 reps bilaterally;   Sit to stand without UE support with LLE on 2" Airex pad to bias RLE x 10;     Neuromuscular Re-education  airex pad: 6"  step modified tandem stance 30-45 seconds each position x2 trials each LE position airex pad: 6" step toe taps SUE support 12x each LE.    Pt educated throughout session about proper posture and technique with exercises. Improved exercise technique, movement at target joints, use of target muscles after min to mod verbal, visual, tactile cues.     Patient's condition has the potential to improve in response to therapy. Maximum improvement is yet to be obtained. The anticipated improvement is attainable and reasonable in a generally predictable time.  Patient reports he is feeling stronger but not yet at his normal abilities.    Please refer to goals during session 05/12/19 for information on progression towards functional goals. Patient presents to physical therapy with good motivation. His RLE continues to limit standing and strengthening  intervention due to limited capacity for prolonged muscle recruitment/stabilization. Patient's condition has the potential to improve in response to therapy. Maximum improvement is yet to be obtained. The anticipated improvement is attainable and reasonable in a generally predictable time.                 PT Education - 05/15/19 1025    Education Details  exercise technique, body mechanics    Person(s) Educated  Patient    Methods  Explanation;Demonstration;Tactile cues;Verbal cues    Comprehension  Verbalized understanding;Returned demonstration;Verbal cues required;Tactile cues required       PT Short Term Goals - 05/12/19 1026      PT SHORT TERM GOAL #1   Title  Patient will be adherent to HEP at least 3x a week to improve functional strength and balance for better safety at home.    Baseline  05/12/19: "I do it some of the time."    Time  4    Period  Weeks    Status  Partially Met    Target Date  06/09/19      PT SHORT TERM GOAL #2   Title  Patient will increase BLE gross strength to 4/5 particularly in right hip as to improve functional strength for independent gait, increased standing tolerance and increased ADL ability    Baseline  see visit note 05/12/19    Time  4    Period  Weeks    Status  Partially Met    Target Date  06/09/19      PT SHORT TERM GOAL #3   Title  Patient will be mod I for all bed mobility including rolling and supine to sit without difficulty to increase independence at home.    Baseline  05/12/19: Able to roll to both directions and move from sidelying to sitting, more challenging rolling to the R side and pushing up with RUE    Time  4    Period  Weeks    Status  Achieved    Target Date  --        PT Long Term Goals - 05/12/19 1026      PT LONG TERM GOAL #1   Title  Patient (> 56 years old) will complete five times sit to stand test in < 15 seconds indicating an increased LE strength and improved balance.    Baseline  05/12/19: 16.3s     Time  8    Period  Weeks    Status  On-going    Target Date  07/07/19      PT LONG TERM GOAL #2   Title  Patient will demonstrate an improved Berg Balance Score of >  48/56 as to demonstrate improved balance with ADLs such as sitting/standing and transfer balance and reduced fall risk.    Baseline  05/12/19: 47/56    Time  8    Period  Weeks    Status  Partially Met    Target Date  07/07/19      PT LONG TERM GOAL #3   Title  Patient will increase six minute walk test distance to >1000 for progression to community ambulator and improve gait ability    Baseline  05/12/19: 800'    Time  8    Period  Weeks    Status  Partially Met    Target Date  07/07/19      PT LONG TERM GOAL #4   Title  Patient will tolerate 5 seconds of single leg stance without loss of balance to improve ability to get in and out of shower safely.    Baseline  05/12/19: 1-2s    Time  8    Period  Weeks    Status  On-going    Target Date  07/07/19            Plan - 05/15/19 1235    Clinical Impression Statement  Please refer to goals during session 05/12/19 for information on progression towards functional goals. Patient presents to physical therapy with good motivation. His RLE continues to limit standing and strengthening intervention due to limited capacity for prolonged muscle recruitment/stabilization. Patient's condition has the potential to improve in response to therapy. Maximum improvement is yet to be obtained. The anticipated improvement is attainable and reasonable in a generally predictable time.    Personal Factors and Comorbidities  Comorbidity 3+;Age;Time since onset of injury/illness/exacerbation    Comorbidities  recent stroke, DM x2 (on medication), HTN (somewhat controlled), CKD, HLD, RA (worse in right hand)    Examination-Activity Limitations  Bed Mobility;Caring for Others;Carry;Dressing;Lift;Locomotion Level;Reach Overhead;Squat;Stairs;Stand;Transfers    Examination-Participation Restrictions   Church;Cleaning;Community Activity;Driving;Laundry;Meal Prep;School;Shop;Volunteer;Yard Work    Stability/Clinical Decision Making  Stable/Uncomplicated    Rehab Potential  Good    PT Frequency  2x / week    PT Duration  8 weeks    PT Treatment/Interventions  ADLs/Self Care Home Management;Cryotherapy;Electrical Stimulation;Moist Heat;Gait training;Functional mobility Scientist, forensic;Therapeutic activities;Therapeutic exercise;Balance training;Neuromuscular re-education;Patient/family education;Orthotic Fit/Training;Energy conservation    PT Next Visit Plan  continue with balance and strengthening    Consulted and Agree with Plan of Care  Patient;Family member/caregiver    Family Member Consulted  spouse       Patient will benefit from skilled therapeutic intervention in order to improve the following deficits and impairments:  Abnormal gait, Decreased balance, Decreased endurance, Decreased mobility, Difficulty walking, Impaired perceived functional ability, Cardiopulmonary status limiting activity, Decreased activity tolerance, Decreased safety awareness, Decreased strength  Visit Diagnosis: Muscle weakness (generalized)  Unsteadiness on feet  Other lack of coordination     Problem List Patient Active Problem List   Diagnosis Date Noted  . Stroke ([place]) 11/19/2018  . Syncope 10/21/2015  . Drug-induced pneumonitis 04/02/2015  . COPD (chronic obstructive pulmonary disease) ([place]) 04/02/2015  . Dyspnea 04/02/2015  . COPD exacerbation ([place]) 03/11/2015  . CAFL (chronic airflow limitation) ([place]) 08/14/2014  . HLD (hyperlipidemia) 08/14/2014  . BP (high blood pressure) 08/14/2014  . Diabetes mellitus type 2, uncontrolled ([place]) 08/14/2014  . Chronic kidney disease (CKD), stage III (moderate) 01/27/2014  . 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 [place], [place], [address] Phone: [phone]   Fax:  [phone]  Name: Isaac Stevens MRN: [id number] Date of Birth: 07-Aug-1935

## 2019-05-19 ENCOUNTER — Ambulatory Visit: Payer: PPO | Admitting: Occupational Therapy

## 2019-05-19 ENCOUNTER — Ambulatory Visit: Payer: PPO

## 2019-05-19 ENCOUNTER — Encounter: Payer: Self-pay | Admitting: Occupational Therapy

## 2019-05-19 ENCOUNTER — Other Ambulatory Visit: Payer: Self-pay

## 2019-05-19 DIAGNOSIS — M6281 Muscle weakness (generalized): Secondary | ICD-10-CM

## 2019-05-19 DIAGNOSIS — R2681 Unsteadiness on feet: Secondary | ICD-10-CM

## 2019-05-19 DIAGNOSIS — R278 Other lack of coordination: Secondary | ICD-10-CM

## 2019-05-19 NOTE — Therapy (Signed)
[place] MAIN [facility] SERVICES [address] [place], [place], [address] Phone: [phone]   Fax:  [phone]  Physical Therapy Treatment  Patient Details  Name: Isaac Stevens MRN: [id number] Date of Birth: 08/07/1935 Referring Provider (PT): Rachelle Hora, MD   Encounter Date: 05/19/2019  PT End of Session - 05/19/19 1129    Visit Number  11    Number of Visits  33    Date for PT Re-Evaluation  07/07/19    Authorization Type  Pn 05/15/19    PT Start Time  1025    PT Stop Time  1100    PT Time Calculation (min)  35 min    Equipment Utilized During Treatment  Gait belt    Activity Tolerance  Patient tolerated treatment well    Behavior During Therapy  [facility] for tasks assessed/performed       Past Medical History:  Diagnosis Date  . CAFL (chronic airflow limitation) (HCC)   . CKD (chronic kidney disease)   . Diabetes mellitus without complication ([place])   . Hyperlipidemia   . Hypertension   . Rheumatoid arthritis [facility])     Past Surgical History:  Procedure Laterality Date  . TOE SURGERY     right foot    There were no vitals filed for this visit.  Subjective Assessment - 05/19/19 1128    Subjective  Patient reports no falls or LOB. No changes in health/medications. No falls. No specific questions/concerns.    Pertinent History  Pt. is an 84 y.o. male who was initially admitted to [facility] for 24 hours following a CVA on 11/19/18. Pt. was discharged, and one week later was admitted to [facility] with a CVA presenting with right sided weakness. Pt. received inpatient therapy services, and [facility] rehab services upon discharge. He is now being referred for outpatient rehab. Patient has already started outpatient OT to address RUE weakness and ADLs. 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 He has also been referred to orthopedics in January 2021 due to RUE frozen shoulder/weakness and pain. He presents to therapy with tripod base cane  which he reports using intermittently to reduce fall risk. He denies any recent falls. He denies any numbness/tingling    Limitations  Standing;Walking    How long can you sit comfortably?  NA    How long can you stand comfortably?  5-10 min, with fatigue    How long can you walk comfortably?  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  No/denies       Ther-ex Warm up on NuStep L1/2 during history x 5 minutes (4 minutes unbilled);  Quantum single leg press 40# 2 x 10 bilaterall, for RLE pt requires assist for first rep and occasional assist with reps throughout due to weakness;   Seated with4# ankle weights: Marching 2 x20bilateral; LAQ 2 x20bilateral;  Sit to stand without UE support with LLE on 2" Airex pad to bias RLE 2 x 10;  Seated clams with green tband x 20; Seated adductor ball squeeze 3s hold x 20;   Pt educated throughout session about proper posture and technique with exercises. Improved exercise technique, movement at target joints, use of target muscles after min to mod verbal, visual, tactile cues.    Patient motivated  and participated well within session.He arrived late so session had to be abbreviated accordingly. Pt was instructed in advanced LE strengthening today. Performed single leg press and he requires occasional assistance for RLE. He continues to demonstrate decreased RLE strength compared to L side.He does require min VCs for proper positioning/exercise technique for optimal strengthening.He requires intermittent seated rest breaks provided due to fatigue. He would benefit from addtional skilled PT intervention to improve strength, balance and mobility.                            PT Short Term Goals - 05/12/19 1026      PT SHORT TERM GOAL #1   Title  Patient will be  adherent to HEP at least 3x a week to improve functional strength and balance for better safety at home.    Baseline  05/12/19: "I do it some of the time."    Time  4    Period  Weeks    Status  Partially Met    Target Date  06/09/19      PT SHORT TERM GOAL #2   Title  Patient will increase BLE gross strength to 4/5 particularly in right hip as to improve functional strength for independent gait, increased standing tolerance and increased ADL ability    Baseline  see visit note 05/12/19    Time  4    Period  Weeks    Status  Partially Met    Target Date  06/09/19      PT SHORT TERM GOAL #3   Title  Patient will be mod I for all bed mobility including rolling and supine to sit without difficulty to increase independence at home.    Baseline  05/12/19: Able to roll to both directions and move from sidelying to sitting, more challenging rolling to the R side and pushing up with RUE    Time  4    Period  Weeks    Status  Achieved    Target Date  --        PT Long Term Goals - 05/12/19 1026      PT LONG TERM GOAL #1   Title  Patient (> 75 years old) will complete five times sit to stand test in < 15 seconds indicating an increased LE strength and improved balance.    Baseline  05/12/19: 16.3s    Time  8    Period  Weeks    Status  On-going    Target Date  07/07/19      PT LONG TERM GOAL #2   Title  Patient will demonstrate an improved Berg Balance Score of >48/56 as to demonstrate improved balance with ADLs such as sitting/standing and transfer balance and reduced fall risk.    Baseline  05/12/19: 47/56    Time  8    Period  Weeks    Status  Partially Met    Target Date  07/07/19      PT LONG TERM GOAL #3   Title  Patient will increase six minute walk test distance to >1000 for progression to community ambulator and improve gait ability    Baseline  05/12/19: 800'    Time  8    Period  Weeks    Status  Partially Met    Target Date  07/07/19      PT LONG TERM GOAL #4   Title   Patient will tolerate 5 seconds  of single leg stance without loss of balance to improve ability to get in and out of shower safely.    Baseline  05/12/19: 1-2s    Time  8    Period  Weeks    Status  On-going    Target Date  07/07/19            Plan - 05/19/19 1131    Clinical Impression Statement  Patient motivated and participated well within session. He arrived late so session had to be abbreviated accordingly. Pt was instructed in advanced LE strengthening today. Performed single leg press and he requires occasional assistance for RLE. He continues to demonstrate decreased RLE strength compared to L side. He does require min VCs for proper positioning/exercise technique for optimal strengthening. He requires intermittent seated rest breaks provided due to fatigue. He would benefit from addtional skilled PT intervention to improve strength, balance and mobility.    Personal Factors and Comorbidities  Comorbidity 3+;Age;Time since onset of injury/illness/exacerbation    Comorbidities  recent stroke, DM x2 (on medication), HTN (somewhat controlled), CKD, HLD, RA (worse in right hand)    Examination-Activity Limitations  Bed Mobility;Caring for Others;Carry;Dressing;Lift;Locomotion Level;Reach Overhead;Squat;Stairs;Stand;Transfers    Examination-Participation Restrictions  Church;Cleaning;Community Activity;Driving;Laundry;Meal Prep;School;Shop;Volunteer;Yard Work    Stability/Clinical Decision Making  Stable/Uncomplicated    Rehab Potential  Good    PT Frequency  2x / week    PT Duration  8 weeks    PT Treatment/Interventions  ADLs/Self Care Home Management;Cryotherapy;Electrical Stimulation;Moist Heat;Gait training;Functional mobility Scientist, forensic;Therapeutic activities;Therapeutic exercise;Balance training;Neuromuscular re-education;Patient/family education;Orthotic Fit/Training;Energy conservation    PT Next Visit Plan  continue with balance and strengthening    Consulted and  Agree with Plan of Care  Patient;Family member/caregiver    Family Member Consulted  spouse       Patient will benefit from skilled therapeutic intervention in order to improve the following deficits and impairments:  Abnormal gait, Decreased balance, Decreased endurance, Decreased mobility, Difficulty walking, Impaired perceived functional ability, Cardiopulmonary status limiting activity, Decreased activity tolerance, Decreased safety awareness, Decreased strength  Visit Diagnosis: Muscle weakness (generalized)  Unsteadiness on feet     Problem List Patient Active Problem List   Diagnosis Date Noted  . Stroke ([place]) 11/19/2018  . Syncope 10/21/2015  . Drug-induced pneumonitis 04/02/2015  . COPD (chronic obstructive pulmonary disease) ([place]) 04/02/2015  . Dyspnea 04/02/2015  . COPD exacerbation ([place]) 03/11/2015  . CAFL (chronic airflow limitation) ([place]) 08/14/2014  . HLD (hyperlipidemia) 08/14/2014  . BP (high blood pressure) 08/14/2014  . Diabetes mellitus type 2, uncontrolled ([place]) 08/14/2014  . Chronic kidney disease (CKD), stage III (moderate) 01/27/2014  . 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 [place], [place], [address] Phone: [phone]   Fax:  [phone]  Name: NNAMDI DACUS MRN: [id number] Date of Birth: 11-Feb-1935

## 2019-05-19 NOTE — Therapy (Signed)
[place] MAIN [facility] SERVICES [address] [place], [place], [address] Phone: [phone]   Fax:  [phone]  Occupational Therapy Treatment  Patient Details  Name: Isaac Stevens MRN: [id number] Date of Birth: Nov 20, 1935 Referring Provider (OT): Dorise Hiss, [place]   Encounter Date: 05/19/2019  OT End of Session - 05/19/19 1120    Visit Number  19    Number of Visits  24    Date for OT Re-Evaluation  05/28/19    Authorization Type  FOTO   Progress report period starting 02/20/2019    Activity Tolerance  Patient tolerated treatment well    Behavior During Therapy  [facility] for tasks assessed/performed       Past Medical History:  Diagnosis Date  . CAFL (chronic airflow limitation) (HCC)   . CKD (chronic kidney disease)   . Diabetes mellitus without complication ([place])   . Hyperlipidemia   . Hypertension   . Rheumatoid arthritis [facility])     Past Surgical History:  Procedure Laterality Date  . TOE SURGERY     right foot    There were no vitals filed for this visit.  Subjective Assessment - 05/19/19 1115    Subjective   Pt. reports that his hand is less swollen today.    Patient is accompanied by:  Family member    Pertinent History  Pt. is an 84 y.o. male who was initially admitted to [facility] for 24 hours following a CVA. Pt. was discharged, and one week later was admitted to [facility] with a CVA presenting with right sidedl weakness. Pt. received inpatient therapy services, and HHOT services upon discharge. Pt. is now ready for outpatient OT services.    Special Tests  FOTO score: 43    Patient Stated Goals  To regain the use of his right arm.    Currently in Pain?  No/denies       OT TREATMENT  Therapeutic Exercise:  Pt. Performed AROM followed by PROM to the end range. Pt. worked on [employer] at [employer], and increasing the incline wedge35 degrees, followed by 50 degreeswith supportbeingrequired at the elbow. Pt. performed the  [employer] digital dynamometer for the Right hand: 18.2#, 18.6#, 19.2#, 19.6#  Neuromusculer re-ed:  Pt. worked on grasping 1" resistive cubes alternating thumb opposition to the tip of the 2nd through 5th digits while the board is placed at a vertical angle. Pt. worked on pressing the cubes back into place while alternating isolated 2nd through 5th digit extension. Pt. presents with increased compensation proximally with shoulder hiking. Pt. worked on right hand [facility] skills grasping, flipping, turning, and stacking [place] style discs. Pt. required visual demonstration, and cues for movement patterns. Pt. worked on speed, and Soil scientist.   Pt. presents with less edema in his right hand, and is engaging his hand more during tasks at home. Pt  Continues to be able to perform hand to face patterns with his right hand. However has difficulty with completing self-grooming tasks with his right hand. Pt.continues to present with limited proximal right shoulder AROM, RUE ROM, and limited [facility] skills.Pt. Presents with increased compensation proximally with right shoulder hiking during shoulder exercises, and [place] tasks.Pt. continues to work on improving RUE strength, and [facility] skills in order to be able to engage his dominant RUE for more daily self-care tasks including self feeding, grooming, and dressing tasks.  OT Education - 05/19/19 1119    Education Details  RUE ROM, prehension patterns.    Person(s) Educated  Patient    Methods  Explanation;Demonstration    Comprehension  Verbalized understanding;Returned demonstration          OT Long Term Goals - 04/14/19 1055      OT LONG TERM GOAL #1   Title  Pt. will increase Right shoulder AROM  in order to be able to reach into a cabinet.    Baseline  04/14/2019: 20 degrees of isolated flexion. Pt. is unable to reach up into cabinets.    Time  12    Period  Weeks    Status  On-going    Target  Date  05/28/19      OT LONG TERM GOAL #2   Title  Pt. will increase right shoulder abduction to be able to independently wash his hair.    Baseline  04/14/2019  Pt. presents with limited abduction, and is unable to reach up to his head to wash his hair.    Time  12    Period  Weeks    Status  On-going    Target Date  05/28/19      OT LONG TERM GOAL #3   Title  Pt. will perform self-feeding with his right hand with minA.    Baseline  Pt. requires modA, and is able to assist with using a fork with the right hand  when eating an eggs.    Time  12    Period  Weeks    Status  On-going    Target Date  05/28/19      OT LONG TERM GOAL #4   Title  Pt. will increase right wrist extension by 10 degrees in preparation for reaching for a glass.    Baseline  04/14/2019: -15 degrees. Pt. has difficulty reaching for a glass.    Time  12    Period  Weeks    Status  On-going    Target Date  05/28/19      OT LONG TERM GOAL #5   Title  Pt. will independently use his right hand to wash his face.    Baseline  04/14/2019: Pt. is now able to reach his face with his right hand. Pt.is unable to wash his face.    Time  12    Period  Weeks    Status  On-going    Target Date  05/28/19      OT LONG TERM GOAL #6   Title  Pt. will be independent with UE dressing    Baseline  04/14/2019 Pt. requires modA    Time  12    Period  Weeks    Status  On-going    Target Date  05/28/19      OT LONG TERM GOAL #7   Title  Pt. will require minA LE dressing    Baseline  04/14/2019: pt. requires minA donning socks. Pt. requires modA with pants, and shoes.    Time  12    Period  Weeks    Target Date  05/28/19            Plan - 05/19/19 1120    Clinical Impression Statement Pt. presents with less edema in his right hand, and is engaging his hand more during tasks at home. Pt  Continues to be able to perform hand to face patterns with his right hand. However has difficulty with completing self-grooming tasks  with  his right hand. Pt.continues to present with limited proximal right shoulder AROM, RUE ROM, and limited [facility] skills.Pt. Presents with increased compensation proximally with right shoulder hiking during shoulder exercises, and [place] tasks.Pt. continues to work on improving RUE strength, and [facility] skills in order to be able to engage his dominant RUE for more daily self-care tasks including self feeding, grooming, and dressing tasks.   Occupational performance deficits (Please refer to evaluation for details):  ADL's;IADL's;Education    Body Structure / Function / Physical Skills  ADL;FMC;IADL;UE functional use;Dexterity;Strength;ROM;Coordination    Rehab Potential  Good    Clinical Decision Making  Several treatment options, min-mod task modification necessary    Comorbidities Affecting Occupational Performance:  May have comorbidities impacting occupational performance    Modification or Assistance to Complete Evaluation   Min-Moderate modification of tasks or assist with assess necessary to complete eval    OT Frequency  2x / week    OT Duration  12 weeks    OT Treatment/Interventions  Self-care/ADL training;Patient/family education;Therapeutic activities;DME and/or AE instruction;Neuromuscular education;Therapeutic exercise    Consulted and Agree with Plan of Care  Patient    Family Member Consulted  wife       Patient will benefit from skilled therapeutic intervention in order to improve the following deficits and impairments:   Body Structure / Function / Physical Skills: ADL, FMC, IADL, UE functional use, Dexterity, Strength, ROM, Coordination       Visit Diagnosis: Muscle weakness (generalized)  Other lack of coordination    Problem List Patient Active Problem List   Diagnosis Date Noted  . Stroke ([place]) 11/19/2018  . Syncope 10/21/2015  . Drug-induced pneumonitis 04/02/2015  . COPD (chronic obstructive pulmonary disease) ([place]) 04/02/2015  . Dyspnea 04/02/2015  . COPD  exacerbation ([place]) 03/11/2015  . CAFL (chronic airflow limitation) ([place]) 08/14/2014  . HLD (hyperlipidemia) 08/14/2014  . BP (high blood pressure) 08/14/2014  . Diabetes mellitus type 2, uncontrolled ([place]) 08/14/2014  . Chronic kidney disease (CKD), stage III (moderate) 01/27/2014  . 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 [place], [place], [address] Phone: [phone]   Fax:  [phone]  Name: Isaac Stevens MRN: [id number] Date of Birth: 01-10-1936

## 2019-05-22 ENCOUNTER — Other Ambulatory Visit: Payer: Self-pay

## 2019-05-22 ENCOUNTER — Ambulatory Visit: Payer: PPO | Admitting: Occupational Therapy

## 2019-05-22 ENCOUNTER — Encounter: Payer: Self-pay | Admitting: Occupational Therapy

## 2019-05-22 ENCOUNTER — Ambulatory Visit: Payer: PPO

## 2019-05-22 DIAGNOSIS — M6281 Muscle weakness (generalized): Secondary | ICD-10-CM | POA: Diagnosis not present

## 2019-05-22 DIAGNOSIS — R2681 Unsteadiness on feet: Secondary | ICD-10-CM

## 2019-05-22 NOTE — Therapy (Signed)
[place] MAIN [facility] SERVICES [address] [place], [place], [address] Phone: [phone]   Fax:  [phone]  Occupational Therapy Progress Note  Dates of reporting period  02/20/2019   to   05/22/2019  Patient Details  Name: Isaac Stevens MRN: [id number] Date of Birth: January 16, 1936 Referring Provider (OT): Dorise Hiss, [place]   Encounter Date: 05/22/2019  OT End of Session - 05/22/19 1022    Visit Number  20    Number of Visits  24    Date for OT Re-Evaluation  05/28/19    Authorization Type  FOTO   Progress report period starting 02/20/2019    OT Start Time  0933    OT Stop Time  1015    OT Time Calculation (min)  42 min    Activity Tolerance  Patient tolerated treatment well    Behavior During Therapy  [facility] for tasks assessed/performed       Past Medical History:  Diagnosis Date  . CAFL (chronic airflow limitation) (HCC)   . CKD (chronic kidney disease)   . Diabetes mellitus without complication ([place])   . Hyperlipidemia   . Hypertension   . Rheumatoid arthritis [facility])     Past Surgical History:  Procedure Laterality Date  . TOE SURGERY     right foot    There were no vitals filed for this visit.  Subjective Assessment - 05/22/19 1021    Subjective   Pt. reports that the early times are tough.    Patient is accompanied by:  Family member    Pertinent History  Pt. is an 84 y.o. male who was initially admitted to [facility] for 24 hours following a CVA. Pt. was discharged, and one week later was admitted to [facility] with a CVA presenting with right sidedl weakness. Pt. received inpatient therapy services, and HHOT services upon discharge. Pt. is now ready for outpatient OT services.    Special Tests  FOTO score: 43    Patient Stated Goals  To regain the use of his right arm.    Currently in Pain?  No/denies      OT TREATMENT  Neuromuscular re-ed:   Pt. Worked on grasping one inch resistive cubes grasping, and thumb opposition to  the tip of the 2nd. The board was positioned both at a flat tabletop surface, and at a vertical angle. Pt. worked on pressing them back into place while isolating his 2nd digit.  Therapeutic Exercise:  Pt. tolerated AROM followed by PROM to the end range of motion in the RUE for shoulder flexion, abduction, horizontal abduction. Pt. worked on hand to mouth patterns with the RUE in preparation for self-feeding, and  grooming skills with support proximally. Pt. Attempted shoulder stabilization exercises.  Manual Therapy:  Pt. tolerated retrograde massage to the right hand, and digits for edema control. Pt. responded well with decreased edema noted in the right hand followingretrograde massage. Pt. Education was provided about performing retrograde massage on himself with cues, and assist required. Retrograde massage was performed with his right forearm elevated.Pt.was able to formulate gross composite fist following with the second digit unable to reach the [facility].Pt.tolerated soft tissue mobilizations for carpal, and metacarpal spread stretches to prepare the right hand for ROM, and edema control techniques.Manual techniques were performed independent of, and in preparation for ROM.Manual techniques were performed in preparation for, and independent of there. Ex.  Pt. is making progress overall, and is engaging his RUE more during ADLs, and IADL tasks  at home. Pt. is attempting to use his hand for self-feeding tasks, and self grooming. Pt. Is able to reach his face, however is unable to perform the task at his face. Pt. presents with increased edema in his right hand, however is responding well to the retrograde massage.  Pt.continues to present with limited proximal right shoulder AROM, RUE ROM, and limited [facility] skills.Pt. presents with increased compensation proximally with right shoulder hiking during shoulder exercises, and [facility] tasks.Pt. continues to work on improving RUE strength, and [facility]  skills in order to be able to engage his dominant RUE for more daily self-care tasks including self feeding, grooming, and dressing tasks.                      OT Education - 05/22/19 1022    Education Details  RUE ROM, prehension patterns.    Person(s) Educated  Patient    Methods  Explanation;Demonstration    Comprehension  Verbalized understanding;Returned demonstration          OT Long Term Goals - 05/22/19 1024      OT LONG TERM GOAL #1   Title  Pt. will increase Right shoulder AROM  in order to be able to reach into a cabinet.    Baseline  20 degrees of isolated flexion. Pt. has difficulty reaching up into cabinets.    Time  12    Period  Weeks    Status  On-going    Target Date  05/28/19      OT LONG TERM GOAL #2   Title  Pt. will increase right shoulder abduction to be able to independently wash his hair.    Baseline  Pt. conitnues to present with limited abduction, and is unable to reach up to his head to wash his hair.    Time  12    Period  Weeks    Status  On-going    Target Date  05/28/19      OT LONG TERM GOAL #3   Title  Pt. will perform self-feeding with his right hand with minA.    Baseline  Pt. conitnues to require modA, and is able to assist with using a fork with the right hand  when eating an eggs.    Time  12    Period  Weeks    Status  On-going    Target Date  05/28/19      OT LONG TERM GOAL #4   Title  Pt. will increase right wrist extension by 10 degrees in preparation for reaching for a glass.    Baseline  Pt. has difficulty reaching for a glass.    Time  12    Period  Weeks    Status  On-going    Target Date  05/28/19      OT LONG TERM GOAL #5   Title  Pt. will independently use his right hand to wash his face.    Baseline  Pt. is now able to reach his face with his right hand. Pt.is unable to perfrome the washing motion.    Time  12    Period  Weeks    Status  On-going    Target Date  05/28/19      OT LONG TERM  GOAL #6   Title  Pt. will be independent with UE dressing    Baseline  Pt. requires modA    Time  12    Period  Weeks  Status  On-going    Target Date  05/28/19      OT LONG TERM GOAL #7   Title  Pt. will require minA LE dressing    Baseline  Pt. continues to require minA donning socks. Pt. requires modA with pants, and shoes.    Time  12    Period  Weeks    Status  On-going    Target Date  05/28/19            Plan - 05/22/19 1023    Clinical Impression Statement  Pt. is making progress overall, and is engaging his RUE more during ADLs, and IADL tasks at home. Pt. is attempting to use his hand for self-feeding tasks, and self grooming. Pt. Is able to reach his face, however is unable to perform the task at his face. Pt. presents with increased edema in his right hand, however is responding well to the retrograde massage.  Pt.continues to present with limited proximal right shoulder AROM, RUE ROM, and limited [facility] skills.Pt. presents with increased compensation proximally with right shoulder hiking during shoulder exercises, and [facility] tasks.Pt. continues to work on improving RUE strength, and [facility] skills in order to be able to engage his dominant RUE for more daily self-care tasks including self feeding, grooming, and dressing tasks.   Occupational performance deficits (Please refer to evaluation for details):  ADL's;IADL's;Education    Body Structure / Function / Physical Skills  ADL;FMC;IADL;UE functional use;Dexterity;Strength;ROM;Coordination    Rehab Potential  Good    Clinical Decision Making  Several treatment options, min-mod task modification necessary    Comorbidities Affecting Occupational Performance:  May have comorbidities impacting occupational performance    Modification or Assistance to Complete Evaluation   Min-Moderate modification of tasks or assist with assess necessary to complete eval    OT Frequency  2x / week    OT Duration  12 weeks    OT  Treatment/Interventions  Self-care/ADL training;Patient/family education;Therapeutic activities;DME and/or AE instruction;Neuromuscular education;Therapeutic exercise    Consulted and Agree with Plan of Care  Patient    Family Member Consulted  wife       Patient will benefit from skilled therapeutic intervention in order to improve the following deficits and impairments:   Body Structure / Function / Physical Skills: ADL, FMC, IADL, UE functional use, Dexterity, Strength, ROM, Coordination       Visit Diagnosis: Muscle weakness (generalized)    Problem List Patient Active Problem List   Diagnosis Date Noted  . Stroke ([place]) 11/19/2018  . Syncope 10/21/2015  . Drug-induced pneumonitis 04/02/2015  . COPD (chronic obstructive pulmonary disease) ([place]) 04/02/2015  . Dyspnea 04/02/2015  . COPD exacerbation ([place]) 03/11/2015  . CAFL (chronic airflow limitation) ([place]) 08/14/2014  . HLD (hyperlipidemia) 08/14/2014  . BP (high blood pressure) 08/14/2014  . Diabetes mellitus type 2, uncontrolled ([place]) 08/14/2014  . Chronic kidney disease (CKD), stage III (moderate) 01/27/2014  . 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

## 2019-05-22 NOTE — Therapy (Signed)
[place] MAIN [facility] SERVICES [address] [place], [place], [address] Phone: [phone]   Fax:  [phone]  Physical Therapy Treatment  Patient Details  Name: Isaac Stevens MRN: [id number] Date of Birth: 1935/10/04 Referring Provider (PT): Rachelle Hora, MD   Encounter Date: 05/22/2019  PT End of Session - 05/22/19 1026    Visit Number  12    Number of Visits  33    Date for PT Re-Evaluation  07/07/19    Authorization Type  Pn 05/15/19    PT Start Time  1017    PT Stop Time  1100    PT Time Calculation (min)  43 min    Equipment Utilized During Treatment  Gait belt    Activity Tolerance  Patient tolerated treatment well    Behavior During Therapy  [facility] for tasks assessed/performed       Past Medical History:  Diagnosis Date  . CAFL (chronic airflow limitation) (HCC)   . CKD (chronic kidney disease)   . Diabetes mellitus without complication ([place])   . Hyperlipidemia   . Hypertension   . Rheumatoid arthritis [facility])     Past Surgical History:  Procedure Laterality Date  . TOE SURGERY     right foot    There were no vitals filed for this visit.  Subjective Assessment - 05/22/19 1025    Subjective  Patient reports no falls or LOB. No changes in health/medications. Denies pain upon arrival. No specific questions/concerns.    Pertinent History  Pt. is an 84 y.o. male who was initially admitted to [facility] for 24 hours following a CVA on 11/19/18. Pt. was discharged, and one week later was admitted to [facility] with a CVA presenting with right sided weakness. Pt. received inpatient therapy services, and [facility] rehab services upon discharge. He is now being referred for outpatient rehab. Patient has already started outpatient OT to address RUE weakness and ADLs. 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 He has also been referred to orthopedics in January 2021 due to RUE frozen shoulder/weakness and pain. He presents to therapy with  tripod base cane which he reports using intermittently to reduce fall risk. He denies any recent falls. He denies any numbness/tingling    Limitations  Standing;Walking    How long can you sit comfortably?  NA    How long can you stand comfortably?  5-10 min, with fatigue    How long can you walk comfortably?  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  No/denies        TREATMENT   Ther-ex Warm up on NuStep L2/3 during history x 6 minutes (4 minutes unbilled);  Quantum single leg press 40# 2 x 10 bilateral, for RLE pt requires assist for first rep and occasional assist with reps throughout due to weakness but improved compared to last session;   Standing with4# ankle weights: Marching x20bilateral; Hip abduction x 20 bilateral; HS curls x 20 bilateral;  Seated with 4# ankle weights: LAQ 2 x20bilateral;  Seated clams with green tband x 20; Seated adductor ball squeeze 3s hold x 20;  Sit to stand with LUE support on knee and LLE on 6" step RLE x 10; Side stepping in // bars with 4# ankle weights x 4 lengths;  6" step ups with 4# ankle weights with BUE support on // bars x 10 each side;   Pt educated throughout session about proper posture and technique with exercises. Improved exercise technique, movement at target joints, use of target muscles after min to mod verbal, visual, tactile cues.    Patient motivated and participated well within session. Pt was instructed in advanced LE strengthening today with focus on being able to isolate RLE. He performed single leg press again and he requires occasional assistance for RLE. He continues to demonstrate decreased RLE strength compared to L side.He does require min VCs for proper positioning/exercise technique for optimal strengthening.He requires intermittent seated rest  breaks provided due to fatigue. He would benefit from addtional skilled PT intervention to improve strength, balance and mobility.                         PT Short Term Goals - 05/12/19 1026      PT SHORT TERM GOAL #1   Title  Patient will be adherent to HEP at least 3x a week to improve functional strength and balance for better safety at home.    Baseline  05/12/19: "I do it some of the time."    Time  4    Period  Weeks    Status  Partially Met    Target Date  06/09/19      PT SHORT TERM GOAL #2   Title  Patient will increase BLE gross strength to 4/5 particularly in right hip as to improve functional strength for independent gait, increased standing tolerance and increased ADL ability    Baseline  see visit note 05/12/19    Time  4    Period  Weeks    Status  Partially Met    Target Date  06/09/19      PT SHORT TERM GOAL #3   Title  Patient will be mod I for all bed mobility including rolling and supine to sit without difficulty to increase independence at home.    Baseline  05/12/19: Able to roll to both directions and move from sidelying to sitting, more challenging rolling to the R side and pushing up with RUE    Time  4    Period  Weeks    Status  Achieved    Target Date  --        PT Long Term Goals - 05/12/19 1026      PT LONG TERM GOAL #1   Title  Patient (> 25 years old) will complete five times sit to stand test in < 15 seconds indicating an increased LE strength and improved balance.    Baseline  05/12/19: 16.3s    Time  8    Period  Weeks    Status  On-going    Target Date  07/07/19      PT LONG TERM GOAL #2   Title  Patient will demonstrate an improved Berg Balance Score of >48/56 as to demonstrate improved balance with ADLs such as sitting/standing and transfer balance and reduced fall risk.    Baseline  05/12/19: 47/56    Time  8    Period  Weeks    Status  Partially Met    Target Date  07/07/19      PT LONG TERM GOAL #3   Title   Patient will increase six minute walk test distance to >1000 for progression to community ambulator and improve gait ability  Baseline  05/12/19: 800'    Time  8    Period  Weeks    Status  Partially Met    Target Date  07/07/19      PT LONG TERM GOAL #4   Title  Patient will tolerate 5 seconds of single leg stance without loss of balance to improve ability to get in and out of shower safely.    Baseline  05/12/19: 1-2s    Time  8    Period  Weeks    Status  On-going    Target Date  07/07/19            Plan - 05/22/19 1026    Clinical Impression Statement  Patient motivated and participated well within session. Pt was instructed in advanced LE strengthening today with focus on being able to isolate RLE. He performed single leg press again and he requires occasional assistance for RLE. He continues to demonstrate decreased RLE strength compared to L side. He does require min VCs for proper positioning/exercise technique for optimal strengthening. He requires intermittent seated rest breaks provided due to fatigue. He would benefit from addtional skilled PT intervention to improve strength, balance and mobility.    Personal Factors and Comorbidities  Comorbidity 3+;Age;Time since onset of injury/illness/exacerbation    Comorbidities  recent stroke, DM x2 (on medication), HTN (somewhat controlled), CKD, HLD, RA (worse in right hand)    Examination-Activity Limitations  Bed Mobility;Caring for Others;Carry;Dressing;Lift;Locomotion Level;Reach Overhead;Squat;Stairs;Stand;Transfers    Examination-Participation Restrictions  Church;Cleaning;Community Activity;Driving;Laundry;Meal Prep;School;Shop;Volunteer;Yard Work    Stability/Clinical Decision Making  Stable/Uncomplicated    Rehab Potential  Good    PT Frequency  2x / week    PT Duration  8 weeks    PT Treatment/Interventions  ADLs/Self Care Home Management;Cryotherapy;Electrical Stimulation;Moist Heat;Gait training;Functional mobility  Scientist, forensic;Therapeutic activities;Therapeutic exercise;Balance training;Neuromuscular re-education;Patient/family education;Orthotic Fit/Training;Energy conservation    PT Next Visit Plan  continue with balance and strengthening    Consulted and Agree with Plan of Care  Patient;Family member/caregiver    Family Member Consulted  spouse       Patient will benefit from skilled therapeutic intervention in order to improve the following deficits and impairments:  Abnormal gait, Decreased balance, Decreased endurance, Decreased mobility, Difficulty walking, Impaired perceived functional ability, Cardiopulmonary status limiting activity, Decreased activity tolerance, Decreased safety awareness, Decreased strength  Visit Diagnosis: Muscle weakness (generalized)  Unsteadiness on feet     Problem List Patient Active Problem List   Diagnosis Date Noted  . Stroke ([place]) 11/19/2018  . Syncope 10/21/2015  . Drug-induced pneumonitis 04/02/2015  . COPD (chronic obstructive pulmonary disease) ([place]) 04/02/2015  . Dyspnea 04/02/2015  . COPD exacerbation ([place]) 03/11/2015  . CAFL (chronic airflow limitation) ([place]) 08/14/2014  . HLD (hyperlipidemia) 08/14/2014  . BP (high blood pressure) 08/14/2014  . Diabetes mellitus type 2, uncontrolled ([place]) 08/14/2014  . Chronic kidney disease (CKD), stage III (moderate) 01/27/2014  . 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 MAIN [facility] SERVICES [address] [place], [place], [address] Phone: [phone]   Fax:  [phone]  Name: Isaac Stevens MRN: [id number] Date of Birth: [birth year]

## 2019-05-26 ENCOUNTER — Encounter: Payer: Self-pay | Admitting: Occupational Therapy

## 2019-05-26 ENCOUNTER — Ambulatory Visit: Payer: PPO | Admitting: Occupational Therapy

## 2019-05-26 ENCOUNTER — Ambulatory Visit: Payer: PPO

## 2019-05-26 ENCOUNTER — Other Ambulatory Visit: Payer: Self-pay

## 2019-05-26 DIAGNOSIS — R278 Other lack of coordination: Secondary | ICD-10-CM

## 2019-05-26 DIAGNOSIS — M6281 Muscle weakness (generalized): Secondary | ICD-10-CM | POA: Diagnosis not present

## 2019-05-26 DIAGNOSIS — R2681 Unsteadiness on feet: Secondary | ICD-10-CM

## 2019-05-26 NOTE — Therapy (Addendum)
[place] MAIN [facility] SERVICES [address] [place], [place], [address] Phone: [phone]   Fax:  [phone]  Occupational Therapy Treatment  Patient Details  Name: Isaac Stevens MRN: [id number] Date of Birth: [birth year] Referring Provider (OT): Dorise Hiss, [place]   Encounter Date: 05/26/2019  OT End of Session - 05/26/19 1105    Visit Number  21    Number of Visits  24    Date for OT Re-Evaluation  05/28/19    Authorization Type  FOTO   Progress report period starting 02/20/2019    Activity Tolerance  Patient tolerated treatment well    Behavior During Therapy  [facility] for tasks assessed/performed       Past Medical History:  Diagnosis Date  . CAFL (chronic airflow limitation) (HCC)   . CKD (chronic kidney disease)   . Diabetes mellitus without complication ([place])   . Hyperlipidemia   . Hypertension   . Rheumatoid arthritis [facility])     Past Surgical History:  Procedure Laterality Date  . TOE SURGERY     right foot    There were no vitals filed for this visit.  Subjective Assessment - 05/26/19 1104    Subjective   Pt. reports doing well today.    Patient is accompanied by:  Family member    Pertinent History  Pt. is an 84 y.o. male who was initially admitted to [facility] for 24 hours following a CVA. Pt. was discharged, and one week later was admitted to [facility] with a CVA presenting with right sidedl weakness. Pt. received inpatient therapy services, and HHOT services upon discharge. Pt. is now ready for outpatient OT services.    Special Tests  FOTO score: 43    Patient Stated Goals  To regain the use of his right arm.    Currently in Pain?  No/denies      OT TREATMENT  Therapeutic Exercise:  Pt. toleratedAROM followed by PROM to the end range of motion in the RUE for shoulder flexion, abduction, horizontal abduction. Pt. worked on hand to mouth patterns with the RUE in preparation for self-feeding, and grooming skills with  support proximally. Pt. Attempted shoulder stabilization exercises for the RUE. Pt. Performed right grip strengthening using the Veterinary surgeon. R: 15#, 15.6#, 16.4#, 16.0#  Manual Therapy:  Pt. tolerated retrograde massage to the right hand, and digits for edema control. Pt. responded well with decreased edema noted in the right hand followingretrograde massage.Pt. Education was provided about performing retrograde massage on himself with cues, and assist required. Retrograde massage was performed with his right forearm elevated. Pt.was able to formulate gross composite fist following with the second digit unable to reach the [facility].Pt.tolerated soft tissue mobilizations for carpal, and metacarpal spread stretches to prepare the right hand for ROM, and edema control techniques.Manual techniques were performed independent of, and in preparation for ROM.Manual techniques were performed in preparation for, and independent of there. Ex.  Pt. continues to make progress overall, and is engaging his RUE more during ADLs, and IADL tasks at home Pt. continues to  attempt to use his hand for self-feeding tasks, and self grooming. Pt. Is able to reach his face, however is unable to perform the task at his face. Pt. presents with increased edema in his right hand, however is responding well to retrograde massage.  Edema  Circumference measurements piror to treatment: wrist 20 cm., and MCPs 24 cm, after treatment: wrist 20 cm, and MCPs: 23 cm. Pt.continues to present  with limited proximal right shoulder AROM, RUE ROM, and limited [facility] skills.Pt. presents with increased compensation proximally with right shoulder hiking during shoulder exercises, and [facility] tasks.Pt. continues to work on improving RUE strength, and [facility] skills in order to be able to engage his dominant RUE for more daily self-care tasks including self feeding, grooming, and dressing tasks.                     OT  Education - 05/26/19 1105    Education Details  RUE ROM, prehension patterns.    Person(s) Educated  Patient    Methods  Explanation;Demonstration    Comprehension  Verbalized understanding;Returned demonstration          OT Long Term Goals - 05/22/19 1024      OT LONG TERM GOAL #1   Title  Pt. will increase Right shoulder AROM  in order to be able to reach into a cabinet.    Baseline  20 degrees of isolated flexion. Pt. has difficulty reaching up into cabinets.    Time  12    Period  Weeks    Status  On-going    Target Date  05/28/19      OT LONG TERM GOAL #2   Title  Pt. will increase right shoulder abduction to be able to independently wash his hair.    Baseline  Pt. conitnues to present with limited abduction, and is unable to reach up to his head to wash his hair.    Time  12    Period  Weeks    Status  On-going    Target Date  05/28/19      OT LONG TERM GOAL #3   Title  Pt. will perform self-feeding with his right hand with minA.    Baseline  Pt. conitnues to require modA, and is able to assist with using a fork with the right hand  when eating an eggs.    Time  12    Period  Weeks    Status  On-going    Target Date  05/28/19      OT LONG TERM GOAL #4   Title  Pt. will increase right wrist extension by 10 degrees in preparation for reaching for a glass.    Baseline  Pt. has difficulty reaching for a glass.    Time  12    Period  Weeks    Status  On-going    Target Date  05/28/19      OT LONG TERM GOAL #5   Title  Pt. will independently use his right hand to wash his face.    Baseline  Pt. is now able to reach his face with his right hand. Pt.is unable to perfrome the washing motion.    Time  12    Period  Weeks    Status  On-going    Target Date  05/28/19      OT LONG TERM GOAL #6   Title  Pt. will be independent with UE dressing    Baseline  Pt. requires modA    Time  12    Period  Weeks    Status  On-going    Target Date  05/28/19      OT LONG TERM  GOAL #7   Title  Pt. will require minA LE dressing    Baseline  Pt. continues to require minA donning socks. Pt. requires modA with pants, and shoes.    Time  12    Period  Weeks    Status  On-going    Target Date  05/28/19            Plan - 05/26/19 1105    Clinical Impression Statement Pt. continues to make progress overall, and is engaging his RUE more during ADLs, and IADL tasks at home Pt. continues to  attempt to use his hand for self-feeding tasks, and self grooming. Pt. Is able to reach his face, however is unable to perform the task at his face. Pt. presents with increased edema in his right hand, however is responding well to retrograde massage.  Edema  Circumference measurements piror to treatment: wrist 20 cm., and MCPs 24 cm, after treatment: wrist 20 cm, and MCPs: 23 cm. Pt.continues to present with limited proximal right shoulder AROM, RUE ROM, and limited [facility] skills.Pt. presents with increased compensation proximally with right shoulder hiking during shoulder exercises, and [facility] tasks.Pt. continues to work on improving RUE strength, and [facility] skills in order to be able to engage his dominant RUE for more daily self-care tasks including self feeding, grooming, and dressing tasks.   Occupational performance deficits (Please refer to evaluation for details):  ADL's;IADL's;Education    Body Structure / Function / Physical Skills  ADL;FMC;IADL;UE functional use;Dexterity;Strength;ROM;Coordination    Clinical Decision Making  Several treatment options, min-mod task modification necessary    Comorbidities Affecting Occupational Performance:  May have comorbidities impacting occupational performance    Modification or Assistance to Complete Evaluation   Min-Moderate modification of tasks or assist with assess necessary to complete eval    OT Frequency  2x / week    OT Duration  12 weeks    OT Treatment/Interventions  Self-care/ADL training;Patient/family education;Therapeutic  activities;DME and/or AE instruction;Neuromuscular education;Therapeutic exercise    Consulted and Agree with Plan of Care  Patient    Family Member Consulted  wife       Patient will benefit from skilled therapeutic intervention in order to improve the following deficits and impairments:   Body Structure / Function / Physical Skills: ADL, FMC, IADL, UE functional use, Dexterity, Strength, ROM, Coordination       Visit Diagnosis: Muscle weakness (generalized)  Other lack of coordination    Problem List Patient Active Problem List   Diagnosis Date Noted  . Stroke ([place]) 11/19/2018  . Syncope 10/21/2015  . Drug-induced pneumonitis 04/02/2015  . COPD (chronic obstructive pulmonary disease) ([place]) 04/02/2015  . Dyspnea 04/02/2015  . COPD exacerbation ([place]) 03/11/2015  . CAFL (chronic airflow limitation) ([place]) 08/14/2014  . HLD (hyperlipidemia) 08/14/2014  . BP (high blood pressure) 08/14/2014  . Diabetes mellitus type 2, uncontrolled ([place]) 08/14/2014  . Chronic kidney disease (CKD), stage III (moderate) 01/27/2014  . 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

## 2019-05-26 NOTE — Therapy (Signed)
[place] MAIN [facility] SERVICES [address] [place], [place], [address] Phone: [phone]   Fax:  [phone]  Physical Therapy Treatment  Patient Details  Name: Isaac Stevens MRN: [id number] Date of Birth: 06/23/1935 Referring Provider (PT): Rachelle Hora, MD   Encounter Date: 05/26/2019  PT End of Session - 05/26/19 1102    Visit Number  13    Number of Visits  33    Date for PT Re-Evaluation  07/07/19    Authorization Type  Pn 05/15/19    PT Start Time  1025    PT Stop Time  1100    PT Time Calculation (min)  35 min    Equipment Utilized During Treatment  Gait belt    Activity Tolerance  Patient tolerated treatment well    Behavior During Therapy  [facility] for tasks assessed/performed       Past Medical History:  Diagnosis Date  . CAFL (chronic airflow limitation) (HCC)   . CKD (chronic kidney disease)   . Diabetes mellitus without complication ([place])   . Hyperlipidemia   . Hypertension   . Rheumatoid arthritis [facility])     Past Surgical History:  Procedure Laterality Date  . TOE SURGERY     right foot    There were no vitals filed for this visit.  Subjective Assessment - 05/26/19 1023    Subjective  Patient reports no falls or LOB. No changes in health/medications. Denies pain upon arrival. No specific questions/concerns.    Pertinent History  Pt. is an 84 y.o. male who was initially admitted to [facility] for 24 hours following a CVA on 11/19/18. Pt. was discharged, and one week later was admitted to [facility] with a CVA presenting with right sided weakness. Pt. received inpatient therapy services, and [facility] rehab services upon discharge. He is now being referred for outpatient rehab. Patient has already started outpatient OT to address RUE weakness and ADLs. 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 He has also been referred to orthopedics in January 2021 due to RUE frozen shoulder/weakness and pain. He presents to therapy with  tripod base cane which he reports using intermittently to reduce fall risk. He denies any recent falls. He denies any numbness/tingling    Limitations  Standing;Walking    How long can you sit comfortably?  NA    How long can you stand comfortably?  5-10 min, with fatigue    How long can you walk comfortably?  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  No/denies          TREATMENT   Ther-ex Warm up onNuStep L2/3 during history x 6 minutes (4 minutes unbilled);  Quantum single leg press 40# 2 x 15 bilateral, only assist for RLE is to instruct in how far to lower weight stack; Standing with5# ankle weights: Marchingx20bilateral; Hip abduction x 20 bilateral; HS curls x 20 bilateral;  Seated with 4# ankle weights: LAQ2x20bilateral;  Seated clams with green tband x 20; Seated adductor ball squeeze 3s hold x 20;  Standing heel raises x 20; Standing mini squats x 20; Sit to stand with LUE support on knee and LLE on 6" step RLEx 10;   Pt educated throughout session about proper posture and technique with exercises. Improved exercise technique,  movement at target joints, use of target muscles after min to mod verbal, visual, tactile cues.   Patient motivated and participated well within session. He arrived late so session was abbreviated accordingly. Ptwas instructed in advanced LE strengthening today with focus on being able to isolate RLE. He performed single leg press again and he doesn't require assist with RLE except to instruct in how far to lower the weight stack. Attempted to increase resistance however it is too challenging for patient so instead increased the number of repetitions. He continues to demonstrate decreased RLE strength compared to L side.He does require min VCs for proper  positioning/exercise technique for optimal strengthening.He requires intermittent seated rest breaks provided due to fatigue. He would benefit from addtional skilled PT intervention to improve strength, balance and mobility.                          PT Short Term Goals - 05/12/19 1026      PT SHORT TERM GOAL #1   Title  Patient will be adherent to HEP at least 3x a week to improve functional strength and balance for better safety at home.    Baseline  05/12/19: "I do it some of the time."    Time  4    Period  Weeks    Status  Partially Met    Target Date  06/09/19      PT SHORT TERM GOAL #2   Title  Patient will increase BLE gross strength to 4/5 particularly in right hip as to improve functional strength for independent gait, increased standing tolerance and increased ADL ability    Baseline  see visit note 05/12/19    Time  4    Period  Weeks    Status  Partially Met    Target Date  06/09/19      PT SHORT TERM GOAL #3   Title  Patient will be mod I for all bed mobility including rolling and supine to sit without difficulty to increase independence at home.    Baseline  05/12/19: Able to roll to both directions and move from sidelying to sitting, more challenging rolling to the R side and pushing up with RUE    Time  4    Period  Weeks    Status  Achieved    Target Date  --        PT Long Term Goals - 05/12/19 1026      PT LONG TERM GOAL #1   Title  Patient (> 74 years old) will complete five times sit to stand test in < 15 seconds indicating an increased LE strength and improved balance.    Baseline  05/12/19: 16.3s    Time  8    Period  Weeks    Status  On-going    Target Date  07/07/19      PT LONG TERM GOAL #2   Title  Patient will demonstrate an improved Berg Balance Score of >48/56 as to demonstrate improved balance with ADLs such as sitting/standing and transfer balance and reduced fall risk.    Baseline  05/12/19: 47/56    Time  8    Period   Weeks    Status  Partially Met    Target Date  07/07/19      PT LONG TERM GOAL #3   Title  Patient will increase six minute walk test distance to >1000 for progression to community ambulator and  improve gait ability    Baseline  05/12/19: 800'    Time  8    Period  Weeks    Status  Partially Met    Target Date  07/07/19      PT LONG TERM GOAL #4   Title  Patient will tolerate 5 seconds of single leg stance without loss of balance to improve ability to get in and out of shower safely.    Baseline  05/12/19: 1-2s    Time  8    Period  Weeks    Status  On-going    Target Date  07/07/19            Plan - 05/26/19 1047    Clinical Impression Statement  Patient motivated and participated well within session. He arrived late so session was abbreviated accordingly. Pt was instructed in advanced LE strengthening today with focus on being able to isolate RLE. He performed single leg press again and he doesn't require assist with RLE except to instruct in how far to lower the weight stack. Attempted to increase resistance however it is too challenging for patient so instead increased the number of repetitions.  He continues to demonstrate decreased RLE strength compared to L side. He does require min VCs for proper positioning/exercise technique for optimal strengthening. He requires intermittent seated rest breaks provided due to fatigue. He would benefit from addtional skilled PT intervention to improve strength, balance and mobility.    Personal Factors and Comorbidities  Comorbidity 3+;Age;Time since onset of injury/illness/exacerbation    Comorbidities  recent stroke, DM x2 (on medication), HTN (somewhat controlled), CKD, HLD, RA (worse in right hand)    Examination-Activity Limitations  Bed Mobility;Caring for Others;Carry;Dressing;Lift;Locomotion Level;Reach Overhead;Squat;Stairs;Stand;Transfers    Examination-Participation Restrictions  Church;Cleaning;Community Activity;Driving;Laundry;Meal  Prep;School;Shop;Volunteer;Yard Work    Stability/Clinical Decision Making  Stable/Uncomplicated    Rehab Potential  Good    PT Frequency  2x / week    PT Duration  8 weeks    PT Treatment/Interventions  ADLs/Self Care Home Management;Cryotherapy;Electrical Stimulation;Moist Heat;Gait training;Functional mobility Scientist, forensic;Therapeutic activities;Therapeutic exercise;Balance training;Neuromuscular re-education;Patient/family education;Orthotic Fit/Training;Energy conservation    PT Next Visit Plan  continue with balance and strengthening    Consulted and Agree with Plan of Care  Patient;Family member/caregiver    Family Member Consulted  spouse       Patient will benefit from skilled therapeutic intervention in order to improve the following deficits and impairments:  Abnormal gait, Decreased balance, Decreased endurance, Decreased mobility, Difficulty walking, Impaired perceived functional ability, Cardiopulmonary status limiting activity, Decreased activity tolerance, Decreased safety awareness, Decreased strength  Visit Diagnosis: Muscle weakness (generalized)  Unsteadiness on feet     Problem List Patient Active Problem List   Diagnosis Date Noted  . Stroke ([place]) 11/19/2018  . Syncope 10/21/2015  . Drug-induced pneumonitis 04/02/2015  . COPD (chronic obstructive pulmonary disease) ([place]) 04/02/2015  . Dyspnea 04/02/2015  . COPD exacerbation ([place]) 03/11/2015  . CAFL (chronic airflow limitation) ([place]) 08/14/2014  . HLD (hyperlipidemia) 08/14/2014  . BP (high blood pressure) 08/14/2014  . Diabetes mellitus type 2, uncontrolled ([place]) 08/14/2014  . Chronic kidney disease (CKD), stage III (moderate) 01/27/2014  . 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 [place], [place], [address] Phone: [phone]   Fax:   [phone]  Name: Isaac Stevens MRN: [id number] Date of Birth: 1935-04-06

## 2019-05-29 ENCOUNTER — Ambulatory Visit: Payer: PPO

## 2019-05-29 ENCOUNTER — Encounter: Payer: Self-pay | Admitting: Occupational Therapy

## 2019-05-29 ENCOUNTER — Other Ambulatory Visit: Payer: Self-pay

## 2019-05-29 ENCOUNTER — Ambulatory Visit: Payer: PPO | Admitting: Occupational Therapy

## 2019-05-29 DIAGNOSIS — R278 Other lack of coordination: Secondary | ICD-10-CM

## 2019-05-29 DIAGNOSIS — R2681 Unsteadiness on feet: Secondary | ICD-10-CM

## 2019-05-29 DIAGNOSIS — M6281 Muscle weakness (generalized): Secondary | ICD-10-CM | POA: Diagnosis not present

## 2019-05-29 NOTE — Therapy (Signed)
[place] MAIN [facility] SERVICES [address] [place], [place], [address] Phone: [phone]   Fax:  [phone]  Physical Therapy Treatment  Patient Details  Name: Isaac Stevens MRN: [id number] Date of Birth: [birth year] Referring Provider (PT): Rachelle Hora, MD   Encounter Date: 05/29/2019  PT End of Session - 05/29/19 1023    Visit Number  14    Number of Visits  33    Date for PT Re-Evaluation  07/07/19    Authorization Type  Pn 05/15/19    PT Start Time  1017    PT Stop Time  1100    PT Time Calculation (min)  43 min    Equipment Utilized During Treatment  Gait belt    Activity Tolerance  Patient tolerated treatment well    Behavior During Therapy  [facility] for tasks assessed/performed       Past Medical History:  Diagnosis Date  . CAFL (chronic airflow limitation) (HCC)   . CKD (chronic kidney disease)   . Diabetes mellitus without complication ([place])   . Hyperlipidemia   . Hypertension   . Rheumatoid arthritis [facility])     Past Surgical History:  Procedure Laterality Date  . TOE SURGERY     right foot    There were no vitals filed for this visit.  Subjective Assessment - 05/29/19 1023    Subjective  Patient reports no falls or LOB. No changes in health/medications. Denies pain upon arrival. No specific questions/concerns.    Pertinent History  Pt. is an 84 y.o. male who was initially admitted to [facility] for 24 hours following a CVA on 11/19/18. Pt. was discharged, and one week later was admitted to [facility] with a CVA presenting with right sided weakness. Pt. received inpatient therapy services, and [facility] rehab services upon discharge. He is now being referred for outpatient rehab. Patient has already started outpatient OT to address RUE weakness and ADLs. 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 He has also been referred to orthopedics in January 2021 due to RUE frozen shoulder/weakness and pain. He presents to therapy with  tripod base cane which he reports using intermittently to reduce fall risk. He denies any recent falls. He denies any numbness/tingling    Limitations  Standing;Walking    How long can you sit comfortably?  NA    How long can you stand comfortably?  5-10 min, with fatigue    How long can you walk comfortably?  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  No/denies           TREATMENT   Ther-ex Warm up onNuStep L2/3during history x 45mnutes (4 minutes unbilled);  Quantum single leg press RLE: 40# x 20, LLE: 40# x 30, 55# x 30  only assist for RLE is to instruct in how far to lower weight stack and for last couple reps of second set;  Standingwith5# ankle weights: Marchingx20bilateral; Hip abduction x 20 bilateral; HS curls x 20 bilateral;  Seated with 5# ankle weights: LAQx20bilateral;  Seated clams with green tband x 20; Seated adductor ball squeeze 3s hold x 20;  Standing heel raises x 20; Standing mini squats x 20; Sit to stand withLUE supporton knee andLLE on 6"stepRLE 2x 10;   Pt educated throughout session  about proper posture and technique with exercises. Improved exercise technique, movement at target joints, use of target muscles after min to mod verbal, visual, tactile cues.   Patient motivated and participated well within session. Ptwas instructed in advanced LE strengthening todaywith focus on being able to isolate RLE.He performed single leg pressagainand is able to increase his repetitions today. He continues to demonstrate decreased RLE strength compared to L side but it is improving with respect to endurance. He does require min VCs for proper positioning/exercise technique for optimal strengthening.He requires intermittent seated rest breaks provided due to fatigue. He would  benefit from addtional skilled PT intervention to improve strength, balance and mobility.                      PT Short Term Goals - 05/12/19 1026      PT SHORT TERM GOAL #1   Title  Patient will be adherent to HEP at least 3x a week to improve functional strength and balance for better safety at home.    Baseline  05/12/19: "I do it some of the time."    Time  4    Period  Weeks    Status  Partially Met    Target Date  06/09/19      PT SHORT TERM GOAL #2   Title  Patient will increase BLE gross strength to 4/5 particularly in right hip as to improve functional strength for independent gait, increased standing tolerance and increased ADL ability    Baseline  see visit note 05/12/19    Time  4    Period  Weeks    Status  Partially Met    Target Date  06/09/19      PT SHORT TERM GOAL #3   Title  Patient will be mod I for all bed mobility including rolling and supine to sit without difficulty to increase independence at home.    Baseline  05/12/19: Able to roll to both directions and move from sidelying to sitting, more challenging rolling to the R side and pushing up with RUE    Time  4    Period  Weeks    Status  Achieved    Target Date  --        PT Long Term Goals - 05/12/19 1026      PT LONG TERM GOAL #1   Title  Patient (> 91 years old) will complete five times sit to stand test in < 15 seconds indicating an increased LE strength and improved balance.    Baseline  05/12/19: 16.3s    Time  8    Period  Weeks    Status  On-going    Target Date  07/07/19      PT LONG TERM GOAL #2   Title  Patient will demonstrate an improved Berg Balance Score of >48/56 as to demonstrate improved balance with ADLs such as sitting/standing and transfer balance and reduced fall risk.    Baseline  05/12/19: 47/56    Time  8    Period  Weeks    Status  Partially Met    Target Date  07/07/19      PT LONG TERM GOAL #3   Title  Patient will increase six minute walk test distance  to >1000 for progression to community ambulator and improve gait ability    Baseline  05/12/19: 800'    Time  8    Period  Weeks  Status  Partially Met    Target Date  07/07/19      PT LONG TERM GOAL #4   Title  Patient will tolerate 5 seconds of single leg stance without loss of balance to improve ability to get in and out of shower safely.    Baseline  05/12/19: 1-2s    Time  8    Period  Weeks    Status  On-going    Target Date  07/07/19            Plan - 05/29/19 1023    Clinical Impression Statement  Patient motivated and participated well within session. Pt was instructed in advanced LE strengthening today with focus on being able to isolate RLE. He performed single leg press again and is able to increase his repetitions today.  He continues to demonstrate decreased RLE strength compared to L side but it is improving with respect to endurance. He does require min VCs for proper positioning/exercise technique for optimal strengthening. He requires intermittent seated rest breaks provided due to fatigue. He would benefit from addtional skilled PT intervention to improve strength, balance and mobility.    Personal Factors and Comorbidities  Comorbidity 3+;Age;Time since onset of injury/illness/exacerbation    Comorbidities  recent stroke, DM x2 (on medication), HTN (somewhat controlled), CKD, HLD, RA (worse in right hand)    Examination-Activity Limitations  Bed Mobility;Caring for Others;Carry;Dressing;Lift;Locomotion Level;Reach Overhead;Squat;Stairs;Stand;Transfers    Examination-Participation Restrictions  Church;Cleaning;Community Activity;Driving;Laundry;Meal Prep;School;Shop;Volunteer;Yard Work    Stability/Clinical Decision Making  Stable/Uncomplicated    Rehab Potential  Good    PT Frequency  2x / week    PT Duration  8 weeks    PT Treatment/Interventions  ADLs/Self Care Home Management;Cryotherapy;Electrical Stimulation;Moist Heat;Gait training;Functional mobility  Scientist, forensic;Therapeutic activities;Therapeutic exercise;Balance training;Neuromuscular re-education;Patient/family education;Orthotic Fit/Training;Energy conservation    PT Next Visit Plan  continue with balance and strengthening    Consulted and Agree with Plan of Care  Patient;Family member/caregiver    Family Member Consulted  spouse       Patient will benefit from skilled therapeutic intervention in order to improve the following deficits and impairments:  Abnormal gait, Decreased balance, Decreased endurance, Decreased mobility, Difficulty walking, Impaired perceived functional ability, Cardiopulmonary status limiting activity, Decreased activity tolerance, Decreased safety awareness, Decreased strength  Visit Diagnosis: Muscle weakness (generalized)  Unsteadiness on feet     Problem List Patient Active Problem List   Diagnosis Date Noted  . Stroke ([place]) 11/19/2018  . Syncope 10/21/2015  . Drug-induced pneumonitis 04/02/2015  . COPD (chronic obstructive pulmonary disease) ([place]) 04/02/2015  . Dyspnea 04/02/2015  . COPD exacerbation ([place]) 03/11/2015  . CAFL (chronic airflow limitation) ([place]) 08/14/2014  . HLD (hyperlipidemia) 08/14/2014  . BP (high blood pressure) 08/14/2014  . Diabetes mellitus type 2, uncontrolled ([place]) 08/14/2014  . Chronic kidney disease (CKD), stage III (moderate) 01/27/2014  . 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 [place], [place], [address] Phone: [phone]   Fax:  [phone]  Name: Isaac Stevens MRN: [id number] Date of Birth: Sep 26, 1935

## 2019-05-29 NOTE — Addendum Note (Signed)
Addended by: Lucia Bitter on: 05/29/2019 03:01 PM   Modules accepted: Orders

## 2019-05-29 NOTE — Therapy (Signed)
[place] MAIN [facility] SERVICES [address] [place], [place], [address] Phone: [phone]   Fax:  [phone]  Occupational Therapy Treatment/Recertification Note  Patient Details  Name: Isaac Stevens MRN: [id number] Date of Birth: [birth year] Referring Provider (OT): Dorise Hiss, [place]   Encounter Date: 05/29/2019  OT End of Session - 05/29/19 1332    Visit Number  22    Number of Visits  24    Date for OT Re-Evaluation  08/20/19    Authorization Type  FOTO   Progress report period starting 02/20/2019    OT Start Time  0935    OT Stop Time  1015    OT Time Calculation (min)  40 min    Activity Tolerance  Patient tolerated treatment well    Behavior During Therapy  [facility] for tasks assessed/performed       Past Medical History:  Diagnosis Date  . CAFL (chronic airflow limitation) (HCC)   . CKD (chronic kidney disease)   . Diabetes mellitus without complication ([place])   . Hyperlipidemia   . Hypertension   . Rheumatoid arthritis [facility])     Past Surgical History:  Procedure Laterality Date  . TOE SURGERY     right foot    There were no vitals filed for this visit.  Subjective Assessment - 05/29/19 1332    Subjective   Pt. reports doing well today.    Patient is accompanied by:  Family member    Pertinent History  Pt. is an 84 y.o. male who was initially admitted to [facility] for 24 hours following a CVA. Pt. was discharged, and one week later was admitted to [facility] with a CVA presenting with right sidedl weakness. Pt. received inpatient therapy services, and HHOT services upon discharge. Pt. is now ready for outpatient OT services.    Special Tests  FOTO score: 43    Currently in Pain?  No/denies        OT TREATMENT  Therapeutic Exercise:  Pt. toleratedAROM followed by PROM to the end range of motion in the RUE for shoulder flexion, abduction, horizontal abduction. Following moist heat modality to the right shoulder. Pt. worked  on hand to mouth patterns with the RUE in preparation for self-feeding, and grooming skills with support proximally. Pt. Attempted shoulder stabilization exercises.  Manual Therapy:  Pt. tolerated retrograde massage to the right hand, and digits for edema control. Pt. responded well with decreased edema noted in the right hand followingretrograde massage.Pt. Education was provided about performing retrograde massage on himself with cues, and assist required. Retrograde massage was performed with his right forearm elevated.Pt.was able to formulate gross composite fist following with the second digit unable to reach the [facility].Pt.tolerated soft tissue mobilizations for carpal, and metacarpal spread stretches to prepare the right hand for ROM, and edema control techniques.Manual techniques were performed independent of, and in preparation for ROM.Manual techniques were performed in preparation for, and independent of there. Ex.  Pt. is making progress overall, and is engaging his RUE more during ADLs, and IADL tasks at home. Pt. is attempting to use his hand for self-feeding tasks, and self grooming. Pt. Continues to be able to reach his face, however is unable to perform the task at his face. Pt. presents with increased edema in his right hand, however is responding well to the retrograde massage.  Pt.continues to present with limited proximal right shoulder AROM, RUE ROM, and limited [facility] skills.Pt. presents with increased compensation proximally with right shoulder  hiking during shoulder exercises, and functional hand use.Pt. continues to work on improving RUE strength, and [facility] skills in order to be able to engage his dominant RUE for more daily self-care tasks including self feeding, grooming, and dressing tasks.                    OT Education - 05/29/19 1332    Education Details  RUE ROM, prehension patterns.    Person(s) Educated  Patient    Methods   Explanation;Demonstration    Comprehension  Verbalized understanding;Returned demonstration          OT Long Term Goals - 05/29/19 1334      OT LONG TERM GOAL #1   Title  Pt. will increase Right shoulder AROM  in order to be able to reach into a cabinet.    Baseline  20 degrees of isolated flexion. Pt. has difficulty reaching up into cabinets.    Time  12    Period  Weeks    Status  On-going    Target Date  08/20/19      OT LONG TERM GOAL #2   Title  Pt. will increase right shoulder abduction to be able to independently wash his hair.    Baseline  Pt. continues to present with limited abduction, and is unable to reach up to his head to wash his hair.    Time  12    Period  Weeks    Status  On-going    Target Date  08/20/19      OT LONG TERM GOAL #3   Title  Pt. will perform self-feeding with his right hand with minA.    Baseline  Pt. continues to require modA, and is able to assist with using a fork with the right hand  when eating an eggs.    Time  12    Period  Weeks    Status  On-going    Target Date  08/20/19      OT LONG TERM GOAL #4   Title  Pt. will increase right wrist extension by 10 degrees in preparation for reaching for a glass.    Baseline  Pt. has difficulty reaching for a glass.    Time  12    Period  Weeks    Status  On-going    Target Date  08/20/19      OT LONG TERM GOAL #5   Title  Pt. will independently use his right hand to wash his face.    Baseline  Pt. is now able to reach his face with his right hand. Pt.is unable to perfrome the washing motion.    Time  12    Period  Weeks    Status  On-going    Target Date  08/20/19      OT LONG TERM GOAL #6   Title  Pt. will be independent with UE dressing    Baseline  Pt. requires modA    Time  12    Period  Weeks    Status  On-going    Target Date  08/20/19      OT LONG TERM GOAL #7   Title  Pt. will require minA LE dressing    Baseline  Pt. continues to require minA donning socks. Pt. requires  modA with pants, and shoes.    Time  12    Period  Weeks    Status  On-going    Target Date  08/20/19  Plan - 05/29/19 1333    Clinical Impression Statement  Pt. is making progress overall, and is engaging his RUE more during ADLs, and IADL tasks at home. Pt. is attempting to use his hand for self-feeding tasks, and self grooming. Pt. Continues to be able to reach his face, however is unable to perform the task at his face. Pt. presents with increased edema in his right hand, however is responding well to the retrograde massage.  Pt.continues to present with limited proximal right shoulder AROM, RUE ROM, and limited [facility] skills.Pt. presents with increased compensation proximally with right shoulder hiking during shoulder exercises, and functional hand use.Pt. continues to work on improving RUE strength, and [facility] skills in order to be able to engage his dominant RUE for more daily self-care tasks including self feeding, grooming, and dressing tasks.   Occupational performance deficits (Please refer to evaluation for details):  ADL's;IADL's;Education    Body Structure / Function / Physical Skills  ADL;FMC;IADL;UE functional use;Dexterity;Strength;ROM;Coordination    Rehab Potential  Good    Clinical Decision Making  Several treatment options, min-mod task modification necessary    Comorbidities Affecting Occupational Performance:  May have comorbidities impacting occupational performance    Modification or Assistance to Complete Evaluation   Min-Moderate modification of tasks or assist with assess necessary to complete eval    OT Frequency  2x / week    OT Duration  12 weeks    OT Treatment/Interventions  Self-care/ADL training;Patient/family education;Therapeutic activities;DME and/or AE instruction;Neuromuscular education;Therapeutic exercise    Consulted and Agree with Plan of Care  Patient       Patient will benefit from skilled therapeutic intervention in order to improve  the following deficits and impairments:   Body Structure / Function / Physical Skills: ADL, FMC, IADL, UE functional use, Dexterity, Strength, ROM, Coordination       Visit Diagnosis: Muscle weakness (generalized)  Other lack of coordination    Problem List Patient Active Problem List   Diagnosis Date Noted  . Stroke ([place]) 11/19/2018  . Syncope 10/21/2015  . Drug-induced pneumonitis 04/02/2015  . COPD (chronic obstructive pulmonary disease) ([place]) 04/02/2015  . Dyspnea 04/02/2015  . COPD exacerbation ([place]) 03/11/2015  . CAFL (chronic airflow limitation) ([place]) 08/14/2014  . HLD (hyperlipidemia) 08/14/2014  . BP (high blood pressure) 08/14/2014  . Diabetes mellitus type 2, uncontrolled ([place]) 08/14/2014  . Chronic kidney disease (CKD), stage III (moderate) 01/27/2014  . 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 [place], [place], [address] Phone: [phone]   Fax:  [phone]  Name: Isaac Stevens MRN: [id number] Date of Birth: February 28, 1935

## 2019-06-02 ENCOUNTER — Ambulatory Visit: Payer: PPO

## 2019-06-02 ENCOUNTER — Other Ambulatory Visit: Payer: Self-pay

## 2019-06-02 DIAGNOSIS — M6281 Muscle weakness (generalized): Secondary | ICD-10-CM | POA: Diagnosis not present

## 2019-06-02 DIAGNOSIS — R2681 Unsteadiness on feet: Secondary | ICD-10-CM

## 2019-06-02 NOTE — Therapy (Signed)
[place] MAIN [facility] SERVICES [address] [place], [place], [address] Phone: [phone]   Fax:  [phone]  Physical Therapy Treatment  Patient Details  Name: Isaac Stevens MRN: [id number] Date of Birth: 20-Jan-1936 Referring Provider (PT): Rachelle Hora, MD   Encounter Date: 06/02/2019  PT End of Session - 06/02/19 1027    Visit Number  15    Number of Visits  33    Date for PT Re-Evaluation  07/07/19    Authorization Type  Pn 05/15/19    PT Start Time  1025    PT Stop Time  1100    PT Time Calculation (min)  35 min    Equipment Utilized During Treatment  Gait belt    Activity Tolerance  Patient tolerated treatment well    Behavior During Therapy  [facility] for tasks assessed/performed       Past Medical History:  Diagnosis Date  . CAFL (chronic airflow limitation) (HCC)   . CKD (chronic kidney disease)   . Diabetes mellitus without complication ([place])   . Hyperlipidemia   . Hypertension   . Rheumatoid arthritis [facility])     Past Surgical History:  Procedure Laterality Date  . TOE SURGERY     right foot    There were no vitals filed for this visit.  Subjective Assessment - 06/02/19 1026    Subjective  Patient reports no falls or LOB. No changes in health/medications. Denies pain upon arrival. No specific questions/concerns.    Pertinent History  Pt. is an 84 y.o. male who was initially admitted to [facility] for 24 hours following a CVA on 11/19/18. Pt. was discharged, and one week later was admitted to [facility] with a CVA presenting with right sided weakness. Pt. received inpatient therapy services, and [facility] rehab services upon discharge. He is now being referred for outpatient rehab. Patient has already started outpatient OT to address RUE weakness and ADLs. 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 He has also been referred to orthopedics in January 2021 due to RUE frozen shoulder/weakness and pain. He presents to therapy with  tripod base cane which he reports using intermittently to reduce fall risk. He denies any recent falls. He denies any numbness/tingling    Limitations  Standing;Walking    How long can you sit comfortably?  NA    How long can you stand comfortably?  5-10 min, with fatigue    How long can you walk comfortably?  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  No/denies         TREATMENT   Ther-ex Warm up onNuStep L2/3during history x 27mnutes (3 minutes unbilled);  Quantum single leg press RLE: 45#2 x10, LLE: 60# x 21, 70# x 30  only assist for RLE is to instruct in how far to lower weight stack and for last couple reps of second set;  Standingwith5# ankle weights: Marchingx20bilateral; Hip abduction x 20 bilateral; Hip extension x 20 bilateral; HS curls x 20 bilateral, assist required on RLE;  Seated with 5# ankle weights: LAQx20bilateral;  Seated clams with green tband x 20; Seated adductor ball squeeze 3s hold x 20;  Standing heel raises x 20; 6" alternating step-ups with BUE support on // bars x 10 leading with each leg;  Pt educated throughout session about proper posture and technique with exercises. Improved exercise technique, movement at target joints, use of target muscles after min to mod verbal, visual, tactile cues.   Patient motivated and participated well within session.He arrived late so session was limited. He was instructed in advanced LE strengthening todaywith focus on being able to isolate RLE.He performed single leg pressagainand is able to increase his resistance today although it is very challenging for him.He continues to demonstrate decreased RLE strength compared to L side but it is improving with respect to endurance. He does require min VCs for proper positioning/exercise  technique for optimal strengthening.He requires intermittent seated rest breaks provided due to fatigue. He reports that he is noting mild improvement in his RLE strength. He would benefit from addtional skilled PT intervention to improve strength, balance and mobility.                        PT Short Term Goals - 05/12/19 1026      PT SHORT TERM GOAL #1   Title  Patient will be adherent to HEP at least 3x a week to improve functional strength and balance for better safety at home.    Baseline  05/12/19: "I do it some of the time."    Time  4    Period  Weeks    Status  Partially Met    Target Date  06/09/19      PT SHORT TERM GOAL #2   Title  Patient will increase BLE gross strength to 4/5 particularly in right hip as to improve functional strength for independent gait, increased standing tolerance and increased ADL ability    Baseline  see visit note 05/12/19    Time  4    Period  Weeks    Status  Partially Met    Target Date  06/09/19      PT SHORT TERM GOAL #3   Title  Patient will be mod I for all bed mobility including rolling and supine to sit without difficulty to increase independence at home.    Baseline  05/12/19: Able to roll to both directions and move from sidelying to sitting, more challenging rolling to the R side and pushing up with RUE    Time  4    Period  Weeks    Status  Achieved    Target Date  --        PT Long Term Goals - 05/12/19 1026      PT LONG TERM GOAL #1   Title  Patient (> 82 years old) will complete five times sit to stand test in < 15 seconds indicating an increased LE strength and improved balance.    Baseline  05/12/19: 16.3s    Time  8    Period  Weeks    Status  On-going    Target Date  07/07/19      PT LONG TERM GOAL #2   Title  Patient will demonstrate an improved Berg Balance Score of >48/56 as to demonstrate improved balance with ADLs such as sitting/standing and transfer balance and reduced fall risk.    Baseline   05/12/19: 47/56    Time  8    Period  Weeks    Status  Partially Met    Target Date  07/07/19      PT LONG TERM GOAL #3   Title  Patient will increase six minute walk test distance to >1000  for progression to community ambulator and improve gait ability    Baseline  05/12/19: 800'    Time  8    Period  Weeks    Status  Partially Met    Target Date  07/07/19      PT LONG TERM GOAL #4   Title  Patient will tolerate 5 seconds of single leg stance without loss of balance to improve ability to get in and out of shower safely.    Baseline  05/12/19: 1-2s    Time  8    Period  Weeks    Status  On-going    Target Date  07/07/19            Plan - 06/02/19 1027    Clinical Impression Statement  Patient motivated and participated well within session. He arrived late so session was limited. He was instructed in advanced LE strengthening today with focus on being able to isolate RLE. He performed single leg press again and is able to increase his resistance today although it is very challenging for him. He continues to demonstrate decreased RLE strength compared to L side but it is improving with respect to endurance. He does require min VCs for proper positioning/exercise technique for optimal strengthening. He requires intermittent seated rest breaks provided due to fatigue. He reports that he is noting mild improvement in his RLE strength. He would benefit from addtional skilled PT intervention to improve strength, balance and mobility.    Personal Factors and Comorbidities  Comorbidity 3+;Age;Time since onset of injury/illness/exacerbation    Comorbidities  recent stroke, DM x2 (on medication), HTN (somewhat controlled), CKD, HLD, RA (worse in right hand)    Examination-Activity Limitations  Bed Mobility;Caring for Others;Carry;Dressing;Lift;Locomotion Level;Reach Overhead;Squat;Stairs;Stand;Transfers    Examination-Participation Restrictions  Church;Cleaning;Community  Activity;Driving;Laundry;Meal Prep;School;Shop;Volunteer;Yard Work    Stability/Clinical Decision Making  Stable/Uncomplicated    Rehab Potential  Good    PT Frequency  2x / week    PT Duration  8 weeks    PT Treatment/Interventions  ADLs/Self Care Home Management;Cryotherapy;Electrical Stimulation;Moist Heat;Gait training;Functional mobility Scientist, forensic;Therapeutic activities;Therapeutic exercise;Balance training;Neuromuscular re-education;Patient/family education;Orthotic Fit/Training;Energy conservation    PT Next Visit Plan  continue with balance and strengthening    Consulted and Agree with Plan of Care  Patient;Family member/caregiver    Family Member Consulted  spouse       Patient will benefit from skilled therapeutic intervention in order to improve the following deficits and impairments:  Abnormal gait, Decreased balance, Decreased endurance, Decreased mobility, Difficulty walking, Impaired perceived functional ability, Cardiopulmonary status limiting activity, Decreased activity tolerance, Decreased safety awareness, Decreased strength  Visit Diagnosis: Muscle weakness (generalized)  Unsteadiness on feet     Problem List Patient Active Problem List   Diagnosis Date Noted  . Stroke ([place]) 11/19/2018  . Syncope 10/21/2015  . Drug-induced pneumonitis 04/02/2015  . COPD (chronic obstructive pulmonary disease) ([place]) 04/02/2015  . Dyspnea 04/02/2015  . COPD exacerbation ([place]) 03/11/2015  . CAFL (chronic airflow limitation) ([place]) 08/14/2014  . HLD (hyperlipidemia) 08/14/2014  . BP (high blood pressure) 08/14/2014  . Diabetes mellitus type 2, uncontrolled ([place]) 08/14/2014  . Chronic kidney disease (CKD), stage III (moderate) 01/27/2014  . 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 [place], [place],  [address] Phone: [phone]   Fax:  [phone]  Name: Isaac Stevens MRN: [id number] Date of Birth:  05/26/1935   

## 2019-06-05 ENCOUNTER — Ambulatory Visit: Payer: PPO | Admitting: Occupational Therapy

## 2019-06-05 ENCOUNTER — Ambulatory Visit: Payer: PPO

## 2019-06-05 ENCOUNTER — Encounter: Payer: Self-pay | Admitting: Occupational Therapy

## 2019-06-05 ENCOUNTER — Other Ambulatory Visit: Payer: Self-pay

## 2019-06-05 DIAGNOSIS — R2681 Unsteadiness on feet: Secondary | ICD-10-CM

## 2019-06-05 DIAGNOSIS — M6281 Muscle weakness (generalized): Secondary | ICD-10-CM | POA: Diagnosis not present

## 2019-06-05 DIAGNOSIS — R278 Other lack of coordination: Secondary | ICD-10-CM

## 2019-06-05 NOTE — Therapy (Signed)
[place] [facility] MAIN REHAB SERVICES [address] [place], [place], [address] Phone: [phone]   Fax:  [phone]  Physical Therapy Treatment  Patient Details  Name: Isaac Stevens MRN: [id number] Date of Birth: 10/07/1935 Referring Provider (PT): Chris Gaines, MD   Encounter Date: 06/05/2019  PT End of Session - 06/05/19 1034    Visit Number  16    Number of Visits  33    Date for PT Re-Evaluation  07/07/19    Authorization Type  Pn 05/15/19    PT Start Time  1020    PT Stop Time  1102    PT Time Calculation (min)  42 min    Equipment Utilized During Treatment  Gait belt    Activity Tolerance  Patient tolerated treatment well    Behavior During Therapy  WFL for tasks assessed/performed       Past Medical History:  Diagnosis Date  . CAFL (chronic airflow limitation) (HCC)   . CKD (chronic kidney disease)   . Diabetes mellitus without complication (HCC)   . Hyperlipidemia   . Hypertension   . Rheumatoid arthritis (HCC)     Past Surgical History:  Procedure Laterality Date  . TOE SURGERY     right foot    There were no vitals filed for this visit.  Subjective Assessment - 06/05/19 1026    Subjective  Patient reports no falls or LOB. No changes in health/medications. Denies pain upon arrival. No specific questions/concerns.    Pertinent History  Pt. is an 84 y.o. male who was initially admitted to [facility] for 24 hours following a CVA on 11/19/18. Pt. was discharged, and one week later was admitted to [facility] with a CVA presenting with right sided weakness. Pt. received inpatient therapy services, and HH rehab services upon discharge. He is now being referred for outpatient rehab. Patient has already started outpatient OT to address RUE weakness and ADLs. 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 He has also been referred to orthopedics in January 2021 due to RUE frozen shoulder/weakness and pain. He presents to therapy with  tripod base cane which he reports using intermittently to reduce fall risk. He denies any recent falls. He denies any numbness/tingling    Limitations  Standing;Walking    How long can you sit comfortably?  NA    How long can you stand comfortably?  5-10 min, with fatigue    How long can you walk comfortably?  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    Patient Stated Goals  "Get to where I can walk and use my right hand."           TREATMENT   Ther-ex Warm up onOctane xRide L2 x 5 minutes for warm-up during history (4 minutes unbilled);  Quantum single leg press RLE: 45# x10, 55# x 10, LLE: 85# x 20, 90# x 20  only assist for RLE is to instruct in how far to lower weight stack and for last couple reps of second set;  Sit to stand without UE support with LLE on 6" step to bias RLE x 10, x 5; Standing heel raises with BUE support x 20;  Seated clams with green tband x 20; Seated adductor ball squeeze 3s hold x 20;   Neuromuscular Re-education  4 square stepping CW/CCW x 2 laps each direction; 6" alternating step taps without UE support; Airex WBOS eye   open/closed x 30s each; Airex WBOS horizontal and vertical head turns x 30s each; Airex NBOS eye open/closed x 30s each; Airex NBOS horizontal and vertical head turns x 30s each;   Pt educated throughout session about proper posture and technique with exercises. Improved exercise technique, movement at target joints, use of target muscles after min to mod verbal, visual, tactile cues.   Patient motivated and participated well within session. He was instructed in advanced LE strengthening todaywith focus on being able to isolate RLE.He performed single leg pressagainand is able to increase his resistance today although it remains very challenging for him. His RLE is getting stronger however he continues to demonstrate decreased RLE strength compared to L side.  He does require min VCs for proper positioning/exercise technique for optimal strengthening.He requires intermittent seated rest breaks provided due to fatigue. Continued with balance exercises today and pt struggle on unstable Airex pad. He also struggles with backward stepping during 4 square stepping patterns. He would benefit from addtional skilled PT intervention to improve strength, balance and mobility.                             PT Short Term Goals - 05/12/19 1026      PT SHORT TERM GOAL #1   Title  Patient will be adherent to HEP at least 3x a week to improve functional strength and balance for better safety at home.    Baseline  05/12/19: "I do it some of the time."    Time  4    Period  Weeks    Status  Partially Met    Target Date  06/09/19      PT SHORT TERM GOAL #2   Title  Patient will increase BLE gross strength to 4/5 particularly in right hip as to improve functional strength for independent gait, increased standing tolerance and increased ADL ability    Baseline  see visit note 05/12/19    Time  4    Period  Weeks    Status  Partially Met    Target Date  06/09/19      PT SHORT TERM GOAL #3   Title  Patient will be mod I for all bed mobility including rolling and supine to sit without difficulty to increase independence at home.    Baseline  05/12/19: Able to roll to both directions and move from sidelying to sitting, more challenging rolling to the R side and pushing up with RUE    Time  4    Period  Weeks    Status  Achieved    Target Date  --        PT Long Term Goals - 05/12/19 1026      PT LONG TERM GOAL #1   Title  Patient (> 60 years old) will complete five times sit to stand test in < 15 seconds indicating an increased LE strength and improved balance.    Baseline  05/12/19: 16.3s    Time  8    Period  Weeks    Status  On-going    Target Date  07/07/19      PT LONG TERM GOAL #2   Title  Patient will demonstrate an improved Berg  Balance Score of >48/56 as to demonstrate improved balance with ADLs such as sitting/standing and transfer balance and reduced fall risk.    Baseline  05/12/19: 47/56    Time  8      Period  Weeks    Status  Partially Met    Target Date  07/07/19      PT LONG TERM GOAL #3   Title  Patient will increase six minute walk test distance to >1000 for progression to community ambulator and improve gait ability    Baseline  05/12/19: 800'    Time  8    Period  Weeks    Status  Partially Met    Target Date  07/07/19      PT LONG TERM GOAL #4   Title  Patient will tolerate 5 seconds of single leg stance without loss of balance to improve ability to get in and out of shower safely.    Baseline  05/12/19: 1-2s    Time  8    Period  Weeks    Status  On-going    Target Date  07/07/19            Plan - 06/05/19 1034    Clinical Impression Statement  Patient motivated and participated well within session. He was instructed in advanced LE strengthening today with focus on being able to isolate RLE. He performed single leg press again and is able to increase his resistance today although it remains very challenging for him. His RLE is getting stronger however he continues to demonstrate decreased RLE strength compared to L side. He does require min VCs for proper positioning/exercise technique for optimal strengthening. He requires intermittent seated rest breaks provided due to fatigue. Continued with balance exercises today and pt struggle on unstable Airex pad. He also struggles with backward stepping during 4 square stepping patterns. He would benefit from addtional skilled PT intervention to improve strength, balance and mobility.    Personal Factors and Comorbidities  Comorbidity 3+;Age;Time since onset of injury/illness/exacerbation    Comorbidities  recent stroke, DM x2 (on medication), HTN (somewhat controlled), CKD, HLD, RA (worse in right hand)    Examination-Activity Limitations  Bed  Mobility;Caring for Others;Carry;Dressing;Lift;Locomotion Level;Reach Overhead;Squat;Stairs;Stand;Transfers    Examination-Participation Restrictions  Church;Cleaning;Community Activity;Driving;Laundry;Meal Prep;School;Shop;Volunteer;Yard Work    Stability/Clinical Decision Making  Stable/Uncomplicated    Rehab Potential  Good    PT Frequency  2x / week    PT Duration  8 weeks    PT Treatment/Interventions  ADLs/Self Care Home Management;Cryotherapy;Electrical Stimulation;Moist Heat;Gait training;Functional mobility Scientist, forensic;Therapeutic activities;Therapeutic exercise;Balance training;Neuromuscular re-education;Patient/family education;Orthotic Fit/Training;Energy conservation    PT Next Visit Plan  continue with balance and strengthening    Consulted and Agree with Plan of Care  Patient;Family member/caregiver    Family Member Consulted  spouse       Patient will benefit from skilled therapeutic intervention in order to improve the following deficits and impairments:  Abnormal gait, Decreased balance, Decreased endurance, Decreased mobility, Difficulty walking, Impaired perceived functional ability, Cardiopulmonary status limiting activity, Decreased activity tolerance, Decreased safety awareness, Decreased strength  Visit Diagnosis: Muscle weakness (generalized)  Unsteadiness on feet     Problem List Patient Active Problem List   Diagnosis Date Noted  . Stroke ([place]) 11/19/2018  . Syncope 10/21/2015  . Drug-induced pneumonitis 04/02/2015  . COPD (chronic obstructive pulmonary disease) ([place]) 04/02/2015  . Dyspnea 04/02/2015  . COPD exacerbation ([place]) 03/11/2015  . CAFL (chronic airflow limitation) ([place]) 08/14/2014  . HLD (hyperlipidemia) 08/14/2014  . BP (high blood pressure) 08/14/2014  . Diabetes mellitus type 2, uncontrolled ([place]) 08/14/2014  . Chronic kidney disease (CKD), stage III (moderate) 01/27/2014  . 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   

## 2019-06-05 NOTE — Therapy (Signed)
[place] MAIN [facility] SERVICES [address] [place], [place], [address] Phone: [phone]   Fax:  [phone]  Occupational Therapy Treatment  Patient Details  Name: Isaac Stevens MRN: [id number] Date of Birth: Mar 25, 1935 Referring Provider (OT): Dorise Hiss, [place]   Encounter Date: 06/05/2019  OT End of Session - 06/05/19 0959    Visit Number  23    Number of Visits  16    Date for OT Re-Evaluation  08/20/19    Authorization Type  FOTO   Progress report period starting 02/20/2019    OT Start Time  0934    OT Stop Time  1015    OT Time Calculation (min)  41 min    Activity Tolerance  Patient tolerated treatment well    Behavior During Therapy  [facility] for tasks assessed/performed       Past Medical History:  Diagnosis Date  . CAFL (chronic airflow limitation) (HCC)   . CKD (chronic kidney disease)   . Diabetes mellitus without complication ([place])   . Hyperlipidemia   . Hypertension   . Rheumatoid arthritis [facility])     Past Surgical History:  Procedure Laterality Date  . TOE SURGERY     right foot    There were no vitals filed for this visit.  Subjective Assessment - 06/05/19 0958    Subjective   Pt. reports doing well today.    Patient is accompanied by:  Family member    Pertinent History  Pt. is an 84 y.o. male who was initially admitted to [facility] for 24 hours following a CVA. Pt. was discharged, and one week later was admitted to [facility] with a CVA presenting with right sidedl weakness. Pt. received inpatient therapy services, and HHOT services upon discharge. Pt. is now ready for outpatient OT services.    Currently in Pain?  No/denies       OT TREATMENT  Therapeutic Exercise:  Pt. performed AAROM using a 1# dowel for bilateral shoulder flexion, and chest presses. Pt. worked on gross Audiological scientist for the right hand: 14.6#, 12.8#, 12.8 #, 15#  Neuromusculer re-ed:  Pt. worked on right  hand [place] skills grasping, flipping, turning, and stacking [place] style discs. Pt. required visual demonstration, and cues for movement patterns. Pt. worked on speed, and Soil scientist. Pt. worked on stabilizing the discs with his 3rd digit, and thumb while turing turing the disc with his 2nd digit.  Manual Therapy:  Manual Therapy:  Pt.tolerated retrograde massage to the right hand, and digits for edema control. Pt. responded well with decreased edema noted in the right hand followingretrograde massage.Pt. Education was provided about performing retrograde massage on himself with cues, and assist required. Retrograde massage was performed with his right forearm elevated. Manual techniques were performed independent of, and in preparation for ROM.Manual techniques were performed in preparation for, and independent of there. Ex.  Pt. continues to present with edema in his right hand. Pt. wears his edema glove intermittently, and is trying to massage his hand at home. Pt. is engaging his hand more during tasks at home. Pt  Continues to be able to perform hand to face patterns with his right hand. However has difficulty with completing self-grooming tasks with his right hand. Pt.presents with limited AROM, RUE ROM, and limited [facility] skills.Pt. presents with increased compensation proximally with right shoulder hiking during shoulder exercises, and [facility] tasks.Pt. continues to work on improving RUE strength, and [facility] skills in order  to be able to engage his dominant RUE for more daily self-care tasks including self feeding, grooming, and dressing tasks.                    OT Education - 06/05/19 0959    Education Details  RUE ROM, prehension patterns.    Person(s) Educated  Patient    Methods  Explanation;Demonstration    Comprehension  Verbalized understanding;Returned demonstration          OT Long Term Goals - 05/29/19 1334      OT LONG TERM GOAL #1   Title   Pt. will increase Right shoulder AROM  in order to be able to reach into a cabinet.    Baseline  20 degrees of isolated flexion. Pt. has difficulty reaching up into cabinets.    Time  12    Period  Weeks    Status  On-going    Target Date  08/20/19      OT LONG TERM GOAL #2   Title  Pt. will increase right shoulder abduction to be able to independently wash his hair.    Baseline  Pt. continues to present with limited abduction, and is unable to reach up to his head to wash his hair.    Time  12    Period  Weeks    Status  On-going    Target Date  08/20/19      OT LONG TERM GOAL #3   Title  Pt. will perform self-feeding with his right hand with minA.    Baseline  Pt. continues to require modA, and is able to assist with using a fork with the right hand  when eating an eggs.    Time  12    Period  Weeks    Status  On-going    Target Date  08/20/19      OT LONG TERM GOAL #4   Title  Pt. will increase right wrist extension by 10 degrees in preparation for reaching for a glass.    Baseline  Pt. has difficulty reaching for a glass.    Time  12    Period  Weeks    Status  On-going    Target Date  08/20/19      OT LONG TERM GOAL #5   Title  Pt. will independently use his right hand to wash his face.    Baseline  Pt. is now able to reach his face with his right hand. Pt.is unable to perfrome the washing motion.    Time  12    Period  Weeks    Status  On-going    Target Date  08/20/19      OT LONG TERM GOAL #6   Title  Pt. will be independent with UE dressing    Baseline  Pt. requires modA    Time  12    Period  Weeks    Status  On-going    Target Date  08/20/19      OT LONG TERM GOAL #7   Title  Pt. will require minA LE dressing    Baseline  Pt. continues to require minA donning socks. Pt. requires modA with pants, and shoes.    Time  12    Period  Weeks    Status  On-going    Target Date  08/20/19            Plan - 06/05/19 0959    Clinical Impression Statement  Pt. continues to present with edema in his right hand. Pt. wears his edema glove intermittently, and is trying to massage his hand at home. Pt. is engaging his hand more during tasks at home. Pt  Continues to be able to perform hand to face patterns with his right hand. However has difficulty with completing self-grooming tasks with his right hand. Pt.presents with limited AROM, RUE ROM, and limited [facility] skills.Pt. presents with increased compensation proximally with right shoulder hiking during shoulder exercises, and [facility] tasks.Pt. continues to work on improving RUE strength, and [facility] skills in order to be able to engage his dominant RUE for more daily self-care tasks including self feeding, grooming, and dressing tasks.     Occupational performance deficits (Please refer to evaluation for details):  ADL's;IADL's;Education    Body Structure / Function / Physical Skills  ADL;FMC;IADL;UE functional use;Dexterity;Strength;ROM;Coordination    Rehab Potential  Good    Clinical Decision Making  Several treatment options, min-mod task modification necessary    Comorbidities Affecting Occupational Performance:  May have comorbidities impacting occupational performance    Modification or Assistance to Complete Evaluation   Min-Moderate modification of tasks or assist with assess necessary to complete eval    OT Frequency  2x / week    OT Duration  12 weeks    OT Treatment/Interventions  Self-care/ADL training;Patient/family education;Therapeutic activities;DME and/or AE instruction;Neuromuscular education;Therapeutic exercise    Consulted and Agree with Plan of Care  Patient       Patient will benefit from skilled therapeutic intervention in order to improve the following deficits and impairments:   Body Structure / Function / Physical Skills: ADL, FMC, IADL, UE functional use, Dexterity, Strength, ROM, Coordination       Visit Diagnosis: Muscle weakness (generalized)  Other lack of  coordination    Problem List Patient Active Problem List   Diagnosis Date Noted  . Stroke ([place]) 11/19/2018  . Syncope 10/21/2015  . Drug-induced pneumonitis 04/02/2015  . COPD (chronic obstructive pulmonary disease) ([place]) 04/02/2015  . Dyspnea 04/02/2015  . COPD exacerbation ([place]) 03/11/2015  . CAFL (chronic airflow limitation) ([place]) 08/14/2014  . HLD (hyperlipidemia) 08/14/2014  . BP (high blood pressure) 08/14/2014  . Diabetes mellitus type 2, uncontrolled ([place]) 08/14/2014  . Chronic kidney disease (CKD), stage III (moderate) 01/27/2014  . 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 [place], [place], [address] Phone: [phone]   Fax:  [phone]  Name: Isaac Stevens MRN: [id number] Date of Birth: Jan 20, 1936

## 2019-06-09 ENCOUNTER — Ambulatory Visit: Payer: PPO

## 2019-06-09 ENCOUNTER — Ambulatory Visit: Payer: PPO | Admitting: Occupational Therapy

## 2019-06-10 DIAGNOSIS — R1031 Right lower quadrant pain: Secondary | ICD-10-CM | POA: Diagnosis not present

## 2019-06-10 DIAGNOSIS — R110 Nausea: Secondary | ICD-10-CM | POA: Diagnosis not present

## 2019-06-11 ENCOUNTER — Ambulatory Visit: Payer: PPO | Admitting: Occupational Therapy

## 2019-06-11 ENCOUNTER — Ambulatory Visit: Payer: PPO

## 2019-06-16 ENCOUNTER — Ambulatory Visit: Payer: PPO | Admitting: Occupational Therapy

## 2019-06-16 ENCOUNTER — Ambulatory Visit: Payer: PPO

## 2019-06-16 DIAGNOSIS — J449 Chronic obstructive pulmonary disease, unspecified: Secondary | ICD-10-CM | POA: Diagnosis not present

## 2019-06-16 DIAGNOSIS — E118 Type 2 diabetes mellitus with unspecified complications: Secondary | ICD-10-CM | POA: Diagnosis not present

## 2019-06-16 DIAGNOSIS — D692 Other nonthrombocytopenic purpura: Secondary | ICD-10-CM | POA: Diagnosis not present

## 2019-06-16 DIAGNOSIS — N1831 Chronic kidney disease, stage 3a: Secondary | ICD-10-CM | POA: Diagnosis not present

## 2019-06-16 DIAGNOSIS — Z9889 Other specified postprocedural states: Secondary | ICD-10-CM | POA: Diagnosis not present

## 2019-06-16 DIAGNOSIS — M064 Inflammatory polyarthropathy: Secondary | ICD-10-CM | POA: Diagnosis not present

## 2019-06-16 DIAGNOSIS — Z85820 Personal history of malignant melanoma of skin: Secondary | ICD-10-CM | POA: Diagnosis not present

## 2019-06-16 DIAGNOSIS — E1165 Type 2 diabetes mellitus with hyperglycemia: Secondary | ICD-10-CM | POA: Diagnosis not present

## 2019-06-16 DIAGNOSIS — M5489 Other dorsalgia: Secondary | ICD-10-CM | POA: Diagnosis not present

## 2019-06-16 DIAGNOSIS — I10 Essential (primary) hypertension: Secondary | ICD-10-CM | POA: Diagnosis not present

## 2019-06-16 DIAGNOSIS — M546 Pain in thoracic spine: Secondary | ICD-10-CM | POA: Diagnosis not present

## 2019-06-16 DIAGNOSIS — M549 Dorsalgia, unspecified: Secondary | ICD-10-CM | POA: Diagnosis not present

## 2019-06-16 DIAGNOSIS — I69351 Hemiplegia and hemiparesis following cerebral infarction affecting right dominant side: Secondary | ICD-10-CM | POA: Diagnosis not present

## 2019-06-18 ENCOUNTER — Ambulatory Visit: Payer: PPO

## 2019-06-18 ENCOUNTER — Ambulatory Visit: Payer: PPO | Admitting: Occupational Therapy

## 2019-06-19 DIAGNOSIS — M9904 Segmental and somatic dysfunction of sacral region: Secondary | ICD-10-CM | POA: Diagnosis not present

## 2019-06-19 DIAGNOSIS — M9902 Segmental and somatic dysfunction of thoracic region: Secondary | ICD-10-CM | POA: Diagnosis not present

## 2019-06-19 DIAGNOSIS — M9903 Segmental and somatic dysfunction of lumbar region: Secondary | ICD-10-CM | POA: Diagnosis not present

## 2019-06-19 DIAGNOSIS — M546 Pain in thoracic spine: Secondary | ICD-10-CM | POA: Diagnosis not present

## 2019-06-23 ENCOUNTER — Ambulatory Visit: Payer: PPO

## 2019-06-23 ENCOUNTER — Other Ambulatory Visit: Payer: Self-pay

## 2019-06-23 ENCOUNTER — Ambulatory Visit: Payer: PPO | Admitting: Occupational Therapy

## 2019-06-23 DIAGNOSIS — M5134 Other intervertebral disc degeneration, thoracic region: Secondary | ICD-10-CM | POA: Diagnosis not present

## 2019-06-23 DIAGNOSIS — M62830 Muscle spasm of back: Secondary | ICD-10-CM | POA: Diagnosis not present

## 2019-06-23 DIAGNOSIS — M546 Pain in thoracic spine: Secondary | ICD-10-CM | POA: Diagnosis not present

## 2019-06-23 DIAGNOSIS — M9904 Segmental and somatic dysfunction of sacral region: Secondary | ICD-10-CM | POA: Diagnosis not present

## 2019-06-23 DIAGNOSIS — M9902 Segmental and somatic dysfunction of thoracic region: Secondary | ICD-10-CM | POA: Diagnosis not present

## 2019-06-23 DIAGNOSIS — M9903 Segmental and somatic dysfunction of lumbar region: Secondary | ICD-10-CM | POA: Diagnosis not present

## 2019-06-25 ENCOUNTER — Ambulatory Visit: Payer: PPO

## 2019-06-25 ENCOUNTER — Encounter: Payer: PPO | Admitting: Occupational Therapy

## 2019-06-26 DIAGNOSIS — M9902 Segmental and somatic dysfunction of thoracic region: Secondary | ICD-10-CM | POA: Diagnosis not present

## 2019-06-26 DIAGNOSIS — M9904 Segmental and somatic dysfunction of sacral region: Secondary | ICD-10-CM | POA: Diagnosis not present

## 2019-06-26 DIAGNOSIS — M9903 Segmental and somatic dysfunction of lumbar region: Secondary | ICD-10-CM | POA: Diagnosis not present

## 2019-06-26 DIAGNOSIS — M546 Pain in thoracic spine: Secondary | ICD-10-CM | POA: Diagnosis not present

## 2019-06-30 ENCOUNTER — Ambulatory Visit: Payer: PPO | Admitting: Occupational Therapy

## 2019-06-30 ENCOUNTER — Ambulatory Visit: Payer: PPO

## 2019-06-30 DIAGNOSIS — M9903 Segmental and somatic dysfunction of lumbar region: Secondary | ICD-10-CM | POA: Diagnosis not present

## 2019-06-30 DIAGNOSIS — M9904 Segmental and somatic dysfunction of sacral region: Secondary | ICD-10-CM | POA: Diagnosis not present

## 2019-06-30 DIAGNOSIS — M9902 Segmental and somatic dysfunction of thoracic region: Secondary | ICD-10-CM | POA: Diagnosis not present

## 2019-06-30 DIAGNOSIS — M546 Pain in thoracic spine: Secondary | ICD-10-CM | POA: Diagnosis not present

## 2019-07-02 ENCOUNTER — Ambulatory Visit: Payer: PPO

## 2019-07-02 ENCOUNTER — Ambulatory Visit: Payer: PPO | Admitting: Occupational Therapy

## 2019-07-03 DIAGNOSIS — M9903 Segmental and somatic dysfunction of lumbar region: Secondary | ICD-10-CM | POA: Diagnosis not present

## 2019-07-03 DIAGNOSIS — M9904 Segmental and somatic dysfunction of sacral region: Secondary | ICD-10-CM | POA: Diagnosis not present

## 2019-07-03 DIAGNOSIS — M546 Pain in thoracic spine: Secondary | ICD-10-CM | POA: Diagnosis not present

## 2019-07-03 DIAGNOSIS — M9902 Segmental and somatic dysfunction of thoracic region: Secondary | ICD-10-CM | POA: Diagnosis not present

## 2019-07-09 ENCOUNTER — Ambulatory Visit: Payer: PPO | Admitting: Physical Therapy

## 2019-07-09 ENCOUNTER — Ambulatory Visit: Payer: PPO | Admitting: Occupational Therapy

## 2019-07-14 DIAGNOSIS — E1165 Type 2 diabetes mellitus with hyperglycemia: Secondary | ICD-10-CM | POA: Diagnosis not present

## 2019-07-14 DIAGNOSIS — E538 Deficiency of other specified B group vitamins: Secondary | ICD-10-CM | POA: Diagnosis not present

## 2019-07-14 DIAGNOSIS — E7849 Other hyperlipidemia: Secondary | ICD-10-CM | POA: Diagnosis not present

## 2019-07-14 DIAGNOSIS — E118 Type 2 diabetes mellitus with unspecified complications: Secondary | ICD-10-CM | POA: Diagnosis not present

## 2019-07-15 ENCOUNTER — Ambulatory Visit: Payer: PPO | Admitting: Occupational Therapy

## 2019-07-15 ENCOUNTER — Ambulatory Visit: Payer: PPO

## 2019-07-17 ENCOUNTER — Ambulatory Visit: Payer: PPO | Attending: Orthopedic Surgery

## 2019-07-17 ENCOUNTER — Other Ambulatory Visit: Payer: Self-pay

## 2019-07-17 DIAGNOSIS — M6281 Muscle weakness (generalized): Secondary | ICD-10-CM | POA: Diagnosis not present

## 2019-07-17 DIAGNOSIS — R2681 Unsteadiness on feet: Secondary | ICD-10-CM | POA: Diagnosis not present

## 2019-07-17 DIAGNOSIS — R278 Other lack of coordination: Secondary | ICD-10-CM | POA: Diagnosis not present

## 2019-07-17 NOTE — Therapy (Signed)
[place] MAIN [facility] SERVICES [address] [place], [place], [address] Phone: [phone]   Fax:  [phone]  Physical Therapy Treatment/Recertification  Patient Details  Name: Isaac Stevens MRN: [id number] Date of Birth: [birth year] Referring Provider (PT): Rachelle Hora, MD   Encounter Date: 07/17/2019   PT End of Session - 07/17/19 1127    Visit Number 17    Number of Visits 60    Date for PT Re-Evaluation 09/11/19    Authorization Type Pn 07/14/87, recertification 3/81/01    PT Start Time 1023    PT Stop Time 1100    PT Time Calculation (min) 37 min    Equipment Utilized During Treatment Gait belt    Activity Tolerance Patient tolerated treatment well    Behavior During Therapy WFL for tasks assessed/performed           Past Medical History:  Diagnosis Date   CAFL (chronic airflow limitation) (HCC)    CKD (chronic kidney disease)    Diabetes mellitus without complication ([place])    Hyperlipidemia    Hypertension    Rheumatoid arthritis ([place])     Past Surgical History:  Procedure Laterality Date   TOE SURGERY     right foot    There were no vitals filed for this visit.   Subjective Assessment - 07/17/19 1036    Subjective Patient reports no falls or LOB. He started having R flank/low back pain after last therapy session but it has since progressed to RLQ abdominal pain. He has seen his PCP as well as physiatry but without improvement. He had a trigger point injection in his back but without improvement. Pt states that he is starting to believe the pain is not musculoskeletal.    Pertinent History Pt. is an 84 y.o. male who was initially admitted to [facility] for 24 hours following a CVA on 11/19/18. Pt. was discharged, and one week later was admitted to [facility] with a CVA presenting with right sided weakness. Pt. received inpatient therapy services, and [facility] rehab services upon discharge. He is now being referred for outpatient  rehab. Patient has already started outpatient OT to address RUE weakness and ADLs. 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 He has also been referred to orthopedics in January 2021 due to RUE frozen shoulder/weakness and pain. He presents to therapy with tripod base cane which he reports using intermittently to reduce fall risk. He denies any recent falls. He denies any numbness/tingling    Limitations Standing;Walking    How long can you sit comfortably? NA    How long can you stand comfortably? 5-10 min, with fatigue    How long can you walk comfortably? 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 Yes    Pain Score 3     Pain Location Abdomen    Pain Orientation Right;Lower    Pain Descriptors / Indicators Aching    Pain Type Acute pain    Pain Onset More than a month ago                 TREATMENT   Ther-ex Interval history obtained from patient; Seated marches with manual resistance from therapist 2 x 20; Seated clams with manual resistance from therapist 2 x 20; Seated adductor squeezes with manual  resistance from therapist 2 x 20;  Standing marches with BUE support x 20 BLE; Standing hip abduction with BUE support x 20 BLE; Standing mini squats with BUE support 2 x 10;   Pt educated throughout session about proper posture and technique with exercises. Improved exercise technique, movement at target joints, use of target muscles after min to mod verbal, visual, tactile cues.   Patient motivated and participated well within session. Performed low level exercises to minimize increase in R flank/RLQ abdominal pain . Pt endorses some straining with BM lately as well as decreased appetite. He denies any burning or frequency with urination. Encouraged pt and wife to call [facility] and follow-up  regarding bloodwork and UA. Deferred updating goals today due to attempt to minimize aggravating pain. Will update goals when pt is feeling better and has less pain. He will benefit from addtional skilled PT intervention to improve strength, balance and mobility.                         PT Short Term Goals - 07/17/19 1036      PT SHORT TERM GOAL #1   Title Patient will be adherent to HEP at least 3x a week to improve functional strength and balance for better safety at home.    Baseline 05/12/19: "I do it some of the time."    Time 4    Period Weeks    Status Partially Met    Target Date 08/14/19      PT SHORT TERM GOAL #2   Title Patient will increase BLE gross strength to 4/5 particularly in right hip as to improve functional strength for independent gait, increased standing tolerance and increased ADL ability    Baseline see visit note 05/12/19    Time 4    Period Weeks    Status Deferred    Target Date 08/14/19      PT SHORT TERM GOAL #3   Title Patient will be mod I for all bed mobility including rolling and supine to sit without difficulty to increase independence at home.    Baseline 05/12/19: Able to roll to both directions and move from sidelying to sitting, more challenging rolling to the R side and pushing up with RUE    Time 4    Period Weeks    Status Achieved             PT Long Term Goals - 07/17/19 1036      PT LONG TERM GOAL #1   Title Patient (> 72 years old) will complete five times sit to stand test in < 15 seconds indicating an increased LE strength and improved balance.    Baseline 05/12/19: 16.3s    Time 8    Period Weeks    Status Deferred    Target Date 09/11/19      PT LONG TERM GOAL #2   Title Patient will demonstrate an improved Berg Balance Score of >48/56 as to demonstrate improved balance with ADLs such as sitting/standing and transfer balance and reduced fall risk.    Baseline 05/12/19: 47/56    Time 8    Period Weeks     Status Deferred    Target Date 09/11/19      PT LONG TERM GOAL #3   Title Patient will increase six minute walk test distance to >1000 for progression to community ambulator and improve gait ability    Baseline 05/12/19: 800'  Time 8    Period Weeks    Status Deferred    Target Date 09/11/19      PT LONG TERM GOAL #4   Title Patient will tolerate 5 seconds of single leg stance without loss of balance to improve ability to get in and out of shower safely.    Baseline 05/12/19: 1-2s    Time 8    Period Weeks    Status Deferred    Target Date 09/11/19                 Plan - 07/17/19 1127    Clinical Impression Statement Patient motivated and participated well within session. Performed low level exercises to minimize increase in R flank/RLQ abdominal pain . Pt endorses some straining with BM lately as well as decreased appetite. He denies any burning or frequency with urination. Encouraged pt and wife to call [facility] and follow-up regarding bloodwork and UA. Deferred updating goals today due to attempt to minimize aggravating pain. Will update goals when pt is feeling better and has less pain. He will benefit from addtional skilled PT intervention to improve strength, balance and mobility.    Personal Factors and Comorbidities Comorbidity 3+;Age;Time since onset of injury/illness/exacerbation    Comorbidities recent stroke, DM x2 (on medication), HTN (somewhat controlled), CKD, HLD, RA (worse in right hand)    Examination-Activity Limitations Bed Mobility;Caring for Others;Carry;Dressing;Lift;Locomotion Level;Reach Overhead;Squat;Stairs;Stand;Transfers    Examination-Participation Restrictions Church;Cleaning;Community Activity;Driving;Laundry;Meal Prep;School;Shop;Volunteer;Yard Work    Stability/Clinical Decision Making Stable/Uncomplicated    Rehab Potential Good    PT Frequency 2x / week    PT Duration 8 weeks    PT Treatment/Interventions ADLs/Self Care Home  Management;Cryotherapy;Electrical Stimulation;Moist Heat;Gait training;Functional mobility Scientist, forensic;Therapeutic activities;Therapeutic exercise;Balance training;Neuromuscular re-education;Patient/family education;Orthotic Fit/Training;Energy conservation    PT Next Visit Plan continue with balance and strengthening    Consulted and Agree with Plan of Care Patient;Family member/caregiver    Family Member Consulted spouse           Patient will benefit from skilled therapeutic intervention in order to improve the following deficits and impairments:  Abnormal gait, Decreased balance, Decreased endurance, Decreased mobility, Difficulty walking, Impaired perceived functional ability, Cardiopulmonary status limiting activity, Decreased activity tolerance, Decreased safety awareness, Decreased strength  Visit Diagnosis: Muscle weakness (generalized)  Unsteadiness on feet     Problem List Patient Active Problem List   Diagnosis Date Noted   Stroke ([place]) 11/19/2018   Syncope 10/21/2015   Drug-induced pneumonitis 04/02/2015   COPD (chronic obstructive pulmonary disease) ([place]) 04/02/2015   Dyspnea 04/02/2015   COPD exacerbation ([place]) 03/11/2015   CAFL (chronic airflow limitation) ([place]) 08/14/2014   HLD (hyperlipidemia) 08/14/2014   BP (high blood pressure) 08/14/2014   Diabetes mellitus type 2, uncontrolled ([place]) 08/14/2014   Chronic kidney disease (CKD), stage III (moderate) 01/27/2014   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

## 2019-07-21 ENCOUNTER — Other Ambulatory Visit: Payer: Self-pay | Admitting: Internal Medicine

## 2019-07-21 DIAGNOSIS — E7849 Other hyperlipidemia: Secondary | ICD-10-CM | POA: Diagnosis not present

## 2019-07-21 DIAGNOSIS — R1011 Right upper quadrant pain: Secondary | ICD-10-CM | POA: Diagnosis not present

## 2019-07-21 DIAGNOSIS — Z0000 Encounter for general adult medical examination without abnormal findings: Secondary | ICD-10-CM | POA: Diagnosis not present

## 2019-07-21 DIAGNOSIS — E538 Deficiency of other specified B group vitamins: Secondary | ICD-10-CM | POA: Diagnosis not present

## 2019-07-21 DIAGNOSIS — N1831 Chronic kidney disease, stage 3a: Secondary | ICD-10-CM

## 2019-07-21 DIAGNOSIS — M064 Inflammatory polyarthropathy: Secondary | ICD-10-CM | POA: Diagnosis not present

## 2019-07-21 DIAGNOSIS — Z8673 Personal history of transient ischemic attack (TIA), and cerebral infarction without residual deficits: Secondary | ICD-10-CM | POA: Diagnosis not present

## 2019-07-21 DIAGNOSIS — R768 Other specified abnormal immunological findings in serum: Secondary | ICD-10-CM | POA: Diagnosis not present

## 2019-07-21 DIAGNOSIS — I10 Essential (primary) hypertension: Secondary | ICD-10-CM | POA: Diagnosis not present

## 2019-07-21 DIAGNOSIS — E118 Type 2 diabetes mellitus with unspecified complications: Secondary | ICD-10-CM | POA: Diagnosis not present

## 2019-07-21 DIAGNOSIS — J449 Chronic obstructive pulmonary disease, unspecified: Secondary | ICD-10-CM | POA: Diagnosis not present

## 2019-07-21 DIAGNOSIS — D692 Other nonthrombocytopenic purpura: Secondary | ICD-10-CM | POA: Diagnosis not present

## 2019-07-22 ENCOUNTER — Ambulatory Visit: Payer: PPO

## 2019-07-22 ENCOUNTER — Encounter: Payer: Self-pay | Admitting: Occupational Therapy

## 2019-07-22 ENCOUNTER — Ambulatory Visit: Payer: PPO | Admitting: Occupational Therapy

## 2019-07-22 ENCOUNTER — Other Ambulatory Visit: Payer: Self-pay

## 2019-07-22 DIAGNOSIS — M6281 Muscle weakness (generalized): Secondary | ICD-10-CM

## 2019-07-22 DIAGNOSIS — R2681 Unsteadiness on feet: Secondary | ICD-10-CM

## 2019-07-22 DIAGNOSIS — R278 Other lack of coordination: Secondary | ICD-10-CM

## 2019-07-22 NOTE — Therapy (Signed)
[place] MAIN [facility] SERVICES [address] [place], [place], [address] Phone: [phone]   Fax:  [phone]  Physical Therapy Treatment/Goal Update  Patient Details  Name: Isaac Stevens MRN: [id number] Date of Birth: 1935-03-04 Referring Provider (PT): Rachelle Hora, MD   Encounter Date: 07/22/2019   PT End of Session - 07/22/19 1120    Visit Number 18    Number of Visits 49    Date for PT Re-Evaluation 09/11/19    Authorization Type Pn 03/10/00, recertification 5/42/70    PT Start Time 1015    PT Stop Time 1100    PT Time Calculation (min) 45 min    Equipment Utilized During Treatment Gait belt    Activity Tolerance Patient tolerated treatment well    Behavior During Therapy [facility] for tasks assessed/performed           Past Medical History:  Diagnosis Date  . CAFL (chronic airflow limitation) (HCC)   . CKD (chronic kidney disease)   . Diabetes mellitus without complication ([place])   . Hyperlipidemia   . Hypertension   . Rheumatoid arthritis [facility])     Past Surgical History:  Procedure Laterality Date  . TOE SURGERY     right foot    There were no vitals filed for this visit.   Subjective Assessment - 07/22/19 1019    Subjective Patient reports no falls or LOB since last therapy session. He has seen his PCP who ordered abdominal radiographs and kidney ultrasound. He denies any pain upon arrivla today and notices a slightl improvement in his flank pain. No specific questions or concerns upon arrival.    Pertinent History Pt. is an 84 y.o. male who was initially admitted to [facility] for 24 hours following a CVA on 11/19/18. Pt. was discharged, and one week later was admitted to [facility] with a CVA presenting with right sided weakness. Pt. received inpatient therapy services, and [facility] rehab services upon discharge. He is now being referred for outpatient rehab. Patient has already started outpatient OT to address RUE weakness and ADLs. 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 He has also been referred to orthopedics in January 2021 due to RUE frozen shoulder/weakness and pain. He presents to therapy with tripod base cane which he reports using intermittently to reduce fall risk. He denies any recent falls. He denies any numbness/tingling    Limitations Standing;Walking    How long can you sit comfortably? NA    How long can you stand comfortably? 5-10 min, with fatigue    How long can you walk comfortably? 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 No/denies    Pain Onset --              [facility] PT Assessment - 07/22/19 1031      Strength   Right/Left Hip --    Right/Left Knee --    Right/Left Ankle --      6 Minute Walk- Baseline   6 Minute Walk- Baseline yes    BP (mmHg) 137/73    HR (bpm) 75    02 Sat (%RA) 97 %    Modified Borg Scale for Dyspnea 0- Nothing at all    Perceived Rate of Exertion (Borg) 6-      6  Minute walk- Post Test   6 Minute Walk Post Test yes    BP (mmHg) 133/94    HR (bpm) 90    02 Sat (%RA) 99 %      6 minute walk test results    Aerobic Endurance Distance Walked 700    Endurance additional comments with tripod base cane, CGA for safety, less than 1000 feet is limited Community education officer   Sit to Stand Able to stand without using hands and stabilize independently    Standing Unsupported Able to stand safely 2 minutes    Sitting with Back Unsupported but Feet Supported on Floor or Stool Able to sit safely and securely 2 minutes    Stand to Sit Sits safely with minimal use of hands    Transfers Able to transfer safely, minor use of hands    Standing Unsupported with Eyes Closed Able to stand 10 seconds with supervision    Standing Unsupported with Feet Together Able to place feet together independently  and stand 1 minute safely    From Standing, Reach Forward with Outstretched Arm Can reach forward >12 cm safely (5")    From Standing Position, Pick up Object from Floor Able to pick up shoe, needs supervision    From Standing Position, Turn to Look Behind Over each Shoulder Looks behind from both sides and weight shifts well    Turn 360 Degrees Able to turn 360 degrees safely but slowly    Standing Unsupported, Alternately Place Feet on Step/Stool Able to complete 4 steps without aid or supervision    Standing Unsupported, One Foot in Front Able to plae foot ahead of the other independently and hold 30 seconds    Standing on One Leg Tries to lift leg/unable to hold 3 seconds but remains standing independently    Total Score 45            TREATMENT   Ther-ex Updated outcome measures with patient including: TUG: 16.1s 5TSTS: 13.9s  39mgait speed: self-selected: 14.8s = 0.68 m/s, fastest: 12.7s= 0.79 m/s BERG:   45/56  6MWT: 700' with CGA and tripod base cane Single leg balance: 1-2s on both sides, more limited on RLE; Strength testing deferred to next session   Patient motivated and participated well within session.Outcome measures and goals updated today as pt is not complaining of pain upon arrival. 5TSTS is improved today at 13.9s. BERG dropped to 45/56 from last time is was updated however still better than it was at initial evaluation. 6MWT decreased to 700' from 800' at last updated but still up from 681 at initial evaluation. He is modified independent with bed mobility however his single leg balance remains limited, especially on his RLE. Will continue with advanced strengthening and balance exercises in future session. Pt encouraged to continue his HEP however he is not entirely compliant with his home program. Will review more in depth at next session. Pt would benefit from addtional skilled PT intervention to improve strength, balance and  mobility.                            PT Short Term Goals - 07/22/19 1122      PT SHORT TERM GOAL #1   Title Patient will be adherent to HEP at least 3x a week to improve functional strength and balance for better safety at home.    Baseline 05/12/19: "I do  it some of the time."    Time 4    Period Weeks    Status Partially Met    Target Date 08/14/19      PT SHORT TERM GOAL #2   Title Patient will increase BLE gross strength to 4/5 particularly in right hip as to improve functional strength for independent gait, increased standing tolerance and increased ADL ability    Baseline see visit note 05/12/19; 07/22/19: Deferred    Time 4    Period Weeks    Status Deferred    Target Date 08/14/19      PT SHORT TERM GOAL #3   Title Patient will be mod I for all bed mobility including rolling and supine to sit without difficulty to increase independence at home.    Baseline 05/12/19: Able to roll to both directions and move from sidelying to sitting, more challenging rolling to the R side and pushing up with RUE    Time 4    Period Weeks    Status Achieved             PT Long Term Goals - 07/22/19 1123      PT LONG TERM GOAL #1   Title Patient (> 75 years old) will complete five times sit to stand test in < 15 seconds indicating an increased LE strength and improved balance.    Baseline 05/12/19: 16.3s; 07/22/19: 13.9s    Time 8    Period Weeks    Status Achieved      PT LONG TERM GOAL #2   Title Patient will demonstrate an improved Berg Balance Score of >48/56 as to demonstrate improved balance with ADLs such as sitting/standing and transfer balance and reduced fall risk.    Baseline 05/12/19: 47/56; 07/22/19: 45/56    Time 8    Period Weeks    Status Partially Met    Target Date 09/11/19      PT LONG TERM GOAL #3   Title Patient will increase six minute walk test distance to >1000 for progression to community ambulator and improve gait ability    Baseline  05/12/19: 800'; 07/22/19: 700'    Time 8    Period Weeks    Status On-going    Target Date 09/11/19      PT LONG TERM GOAL #4   Title Patient will tolerate 5 seconds of single leg stance without loss of balance to improve ability to get in and out of shower safely.    Baseline 05/12/19: 1-2s; 07/22/19: unchanged (1-2s), more difficulty on RLE    Time 8    Period Weeks    Status On-going    Target Date 09/11/19                 Plan - 07/22/19 1121    Clinical Impression Statement Patient motivated and participated well within session. Outcome measures and goals updated today as pt is not complaining of pain upon arrival. 5TSTS is improved today at 13.9s. BERG dropped to 45/56 from last time is was updated however still better than it was at initial evaluation. 6MWT decreased to 700' from 800' at last updated but still up from 16' at initial evaluation. He is modified independent with bed mobility however his single leg balance remains limited, especially on his RLE. Will continue with advanced strengthening and balance exercises in future session. Pt encouraged to continue his HEP however he is not entirely compliant with his home program. Will review more in depth  at next session. Pt would benefit from addtional skilled PT intervention to improve strength, balance and mobility.    Personal Factors and Comorbidities Comorbidity 3+;Age;Time since onset of injury/illness/exacerbation    Comorbidities recent stroke, DM x2 (on medication), HTN (somewhat controlled), CKD, HLD, RA (worse in right hand)    Examination-Activity Limitations Bed Mobility;Caring for Others;Carry;Dressing;Lift;Locomotion Level;Reach Overhead;Squat;Stairs;Stand;Transfers    Examination-Participation Restrictions Church;Cleaning;Community Activity;Driving;Laundry;Meal Prep;School;Shop;Volunteer;Yard Work    Stability/Clinical Decision Making Stable/Uncomplicated    Rehab Potential Good    PT Frequency 2x / week    PT  Duration 8 weeks    PT Treatment/Interventions ADLs/Self Care Home Management;Cryotherapy;Electrical Stimulation;Moist Heat;Gait training;Functional mobility Scientist, forensic;Therapeutic activities;Therapeutic exercise;Balance training;Neuromuscular re-education;Patient/family education;Orthotic Fit/Training;Energy conservation    PT Next Visit Plan Strength testing (update goal), Review and update HEP as needed, continue with balance and strengthening    PT Home Exercise Plan will address next session    Consulted and Agree with Plan of Care Patient;Family member/caregiver    Family Member Consulted spouse           Patient will benefit from skilled therapeutic intervention in order to improve the following deficits and impairments:  Abnormal gait, Decreased balance, Decreased endurance, Decreased mobility, Difficulty walking, Impaired perceived functional ability, Cardiopulmonary status limiting activity, Decreased activity tolerance, Decreased safety awareness, Decreased strength  Visit Diagnosis: Muscle weakness (generalized)  Unsteadiness on feet  Other lack of coordination     Problem List Patient Active Problem List   Diagnosis Date Noted  . Stroke ([place]) 11/19/2018  . Syncope 10/21/2015  . Drug-induced pneumonitis 04/02/2015  . COPD (chronic obstructive pulmonary disease) ([place]) 04/02/2015  . Dyspnea 04/02/2015  . COPD exacerbation ([place]) 03/11/2015  . CAFL (chronic airflow limitation) ([place]) 08/14/2014  . HLD (hyperlipidemia) 08/14/2014  . BP (high blood pressure) 08/14/2014  . Diabetes mellitus type 2, uncontrolled ([place]) 08/14/2014  . Chronic kidney disease (CKD), stage III (moderate) 01/27/2014  . 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

## 2019-07-22 NOTE — Therapy (Signed)
[place] MAIN [facility] SERVICES [address] [place], [place], [address] Phone: [phone]   Fax:  [phone]  Occupational Therapy Treatment  Patient Details  Name: Isaac Stevens MRN: [id number] Date of Birth: [birth year] Referring Provider (OT): Dorise Hiss, [place]   Encounter Date: 07/22/2019   OT End of Session - 07/22/19 1210    Visit Number 24    Number of Visits 64    Date for OT Re-Evaluation 08/20/19    Authorization Type FOTO   Progress report period starting 02/20/2019    OT Start Time 1100    OT Stop Time 1145    OT Time Calculation (min) 45 min    Activity Tolerance Patient tolerated treatment well    Behavior During Therapy [facility] for tasks assessed/performed           Past Medical History:  Diagnosis Date  . CAFL (chronic airflow limitation) (HCC)   . CKD (chronic kidney disease)   . Diabetes mellitus without complication ([place])   . Hyperlipidemia   . Hypertension   . Rheumatoid arthritis [facility])     Past Surgical History:  Procedure Laterality Date  . TOE SURGERY     right foot    There were no vitals filed for this visit.   Subjective Assessment - 07/22/19 1106    Subjective  Patient reports he is getting better with things at home.  Been able to put on socks, buckling belt, pulling pants together and can zip part of the way.  Able to stabilize water bottle in his right hand.    Pertinent History Pt. is an 84 y.o. male who was initially admitted to [facility] for 24 hours following a CVA. Pt. was discharged, and one week later was admitted to [facility] with a CVA presenting with right sidedl weakness. Pt. received inpatient therapy services, and HHOT services upon discharge. Pt. is now ready for outpatient OT services.    Special Tests FOTO score: 43    Patient Stated Goals To regain the use of his right arm.    Currently in Pain? No/denies    Pain Score 0-No pain          Patient seen for A/AAROM of right UE with  shoulder flexion, ABD, elbow flexion/extension, supination/pronation, wrist flexion/ext, digit flexion/ext With shoulder flexion, patient tends to move in a flexion/ABD combo pattern and uses some substitution of movements.  Decreased supination, 45 degrees this date, wrist extension to neutral, ulnar/radial deviation.  Patient reports he has been focusing on using a hand gripper at home with right hand and not as much with finger extension.  Instructed patient on home exercise program via [place] (exs printed this date) for additional focus on wrist extension, supination and ulnar and radial deviation, finger extension.    Patient seen for reaching with shape tower to place items onto first level with right hand, some guiding and cues required to decrease substitution of movements at the shoulder.    Neuromuscular Reeducation: Patient seen for manipulation of ball pegs, unable to pick up and turn the correct way to place into the board, when patient is handed the item, he can place into the board.  Able to remove with right tip to tip prehension pattern with cues with increased time.    Response to tx:   Pt reports decreased pain in right shoulder with ROM, no pain today at rest.  With movement, patient has occasional pain and reports it happens more often with  shoulder extension.  Patient reports he has been working on use of grip strengthener at home on the left and right hands, up to 60 reps, 1-2 times a day.  Recommended pt focus more on finger extension and potentially cut back on the number of reps for gripping on the right.  Patient instructed on wrist extension, ulnar and radial deviation, supination of the forearm and finger extension for use in active reaching.  Patient continues to benefit from skilled occupational therapy services to maximize safety and independence in necessary daily activities at home and in the community.                      OT Education - 07/22/19  1209    Education Details medbridge HEP    Person(s) Educated Patient    Methods Explanation;Demonstration    Comprehension Verbalized understanding;Returned demonstration               OT Long Term Goals - 05/29/19 1334      OT LONG TERM GOAL #1   Title Pt. will increase Right shoulder AROM  in order to be able to reach into a cabinet.    Baseline 20 degrees of isolated flexion. Pt. has difficulty reaching up into cabinets.    Time 12    Period Weeks    Status On-going    Target Date 08/20/19      OT LONG TERM GOAL #2   Title Pt. will increase right shoulder abduction to be able to independently wash his hair.    Baseline Pt. continues to present with limited abduction, and is unable to reach up to his head to wash his hair.    Time 12    Period Weeks    Status On-going    Target Date 08/20/19      OT LONG TERM GOAL #3   Title Pt. will perform self-feeding with his right hand with minA.    Baseline Pt. continues to require modA, and is able to assist with using a fork with the right hand  when eating an eggs.    Time 12    Period Weeks    Status On-going    Target Date 08/20/19      OT LONG TERM GOAL #4   Title Pt. will increase right wrist extension by 10 degrees in preparation for reaching for a glass.    Baseline Pt. has difficulty reaching for a glass.    Time 12    Period Weeks    Status On-going    Target Date 08/20/19      OT LONG TERM GOAL #5   Title Pt. will independently use his right hand to wash his face.    Baseline Pt. is now able to reach his face with his right hand. Pt.is unable to perfrome the washing motion.    Time 12    Period Weeks    Status On-going    Target Date 08/20/19      OT LONG TERM GOAL #6   Title Pt. will be independent with UE dressing    Baseline Pt. requires modA    Time 12    Period Weeks    Status On-going    Target Date 08/20/19      OT LONG TERM GOAL #7   Title Pt. will require minA LE dressing    Baseline Pt.  continues to require minA donning socks. Pt. requires modA with pants, and shoes.  Time 12    Period Weeks    Status On-going    Target Date 08/20/19                 Plan - 07/22/19 1211    Clinical Impression Statement Pt reports decreased pain in right shoulder with ROM, no pain today at rest.  With movement, patient has occasional pain and reports it happens more often with shoulder extension.  Patient reports he has been working on use of grip strengthener at home on the left and right hands, up to 60 reps, 1-2 times a day.  Recommended pt focus more on finger extension and potentially cut back on the number of reps for gripping on the right.  Patient instructed on wrist extension, ulnar and radial deviation, supination of the forearm and finger extension for use in active reaching.  Patient continues to benefit from skilled occupational therapy services to maximize safety and independence in necessary daily activities at home and in the community.    Occupational performance deficits (Please refer to evaluation for details): ADL's;IADL's;Education    Body Structure / Function / Physical Skills ADL;FMC;IADL;UE functional use;Dexterity;Strength;ROM;Coordination    Rehab Potential Good    Clinical Decision Making Several treatment options, min-mod task modification necessary    Comorbidities Affecting Occupational Performance: May have comorbidities impacting occupational performance    Modification or Assistance to Complete Evaluation  Min-Moderate modification of tasks or assist with assess necessary to complete eval    OT Frequency 2x / week    OT Duration 12 weeks    OT Treatment/Interventions Self-care/ADL training;Patient/family education;Therapeutic activities;DME and/or AE instruction;Neuromuscular education;Therapeutic exercise    Consulted and Agree with Plan of Care Patient           Patient will benefit from skilled therapeutic intervention in order to improve the  following deficits and impairments:   Body Structure / Function / Physical Skills: ADL, FMC, IADL, UE functional use, Dexterity, Strength, ROM, Coordination       Visit Diagnosis: Muscle weakness (generalized)  Other lack of coordination  Unsteadiness on feet    Problem List Patient Active Problem List   Diagnosis Date Noted  . Stroke ([place]) 11/19/2018  . Syncope 10/21/2015  . Drug-induced pneumonitis 04/02/2015  . COPD (chronic obstructive pulmonary disease) ([place]) 04/02/2015  . Dyspnea 04/02/2015  . COPD exacerbation ([place]) 03/11/2015  . CAFL (chronic airflow limitation) ([place]) 08/14/2014  . HLD (hyperlipidemia) 08/14/2014  . BP (high blood pressure) 08/14/2014  . Diabetes mellitus type 2, uncontrolled ([place]) 08/14/2014  . Chronic kidney disease (CKD), stage III (moderate) 01/27/2014  . 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 [place], [place], [address] Phone: [phone]   Fax:  [phone]  Name: Isaac Stevens MRN: [id number] Date of Birth: Jul 20, 1935

## 2019-07-24 ENCOUNTER — Ambulatory Visit: Payer: PPO | Admitting: Occupational Therapy

## 2019-07-24 ENCOUNTER — Other Ambulatory Visit: Payer: Self-pay

## 2019-07-24 ENCOUNTER — Ambulatory Visit: Payer: PPO

## 2019-07-24 ENCOUNTER — Encounter: Payer: Self-pay | Admitting: Occupational Therapy

## 2019-07-24 DIAGNOSIS — R278 Other lack of coordination: Secondary | ICD-10-CM

## 2019-07-24 DIAGNOSIS — M6281 Muscle weakness (generalized): Secondary | ICD-10-CM

## 2019-07-24 DIAGNOSIS — R2681 Unsteadiness on feet: Secondary | ICD-10-CM

## 2019-07-24 NOTE — Therapy (Signed)
[place] MAIN [facility] SERVICES [address] [place], [place], [address] Phone: [phone]   Fax:  [phone]  Physical Therapy Treatment  Patient Details  Name: Isaac Stevens MRN: [id number] Date of Birth: 07-Sep-1935 Referring Provider (PT): Rachelle Hora, MD   Encounter Date: 07/24/2019   PT End of Session - 07/24/19 1020    Visit Number 19    Number of Visits 57    Date for PT Re-Evaluation 09/11/19    Authorization Type Pn 10/16/22, recertification 2/68/34    PT Start Time 1015    PT Stop Time 1100    PT Time Calculation (min) 45 min    Equipment Utilized During Treatment Gait belt    Activity Tolerance Patient tolerated treatment well    Behavior During Therapy WFL for tasks assessed/performed           Past Medical History:  Diagnosis Date   CAFL (chronic airflow limitation) (HCC)    CKD (chronic kidney disease)    Diabetes mellitus without complication ([place])    Hyperlipidemia    Hypertension    Rheumatoid arthritis ([place])     Past Surgical History:  Procedure Laterality Date   TOE SURGERY     right foot    There were no vitals filed for this visit.   Subjective Assessment - 07/24/19 1019    Subjective Patient reports no falls or LOB since last therapy session. He reports 1-2/10 R flank pain upon arrival today. No specific questions or concerns upon arrival.    Pertinent History Pt. is an 84 y.o. male who was initially admitted to [facility] for 24 hours following a CVA on 11/19/18. Pt. was discharged, and one week later was admitted to [facility] with a CVA presenting with right sided weakness. Pt. received inpatient therapy services, and [facility] rehab services upon discharge. He is now being referred for outpatient rehab. Patient has already started outpatient OT to address RUE weakness and ADLs. 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 He has also been referred to orthopedics in January 2021 due to RUE frozen  shoulder/weakness and pain. He presents to therapy with tripod base cane which he reports using intermittently to reduce fall risk. He denies any recent falls. He denies any numbness/tingling    Limitations Standing;Walking    How long can you sit comfortably? NA    How long can you stand comfortably? 5-10 min, with fatigue    How long can you walk comfortably? 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 Yes    Pain Score 2     Pain Location Abdomen    Pain Orientation Right    Pain Descriptors / Indicators Aching    Pain Type Acute pain    Pain Onset More than a month ago              [facility] PT Assessment - 07/24/19 1024      Strength   Strength Assessment Site Hip;Knee;Ankle    Right/Left Hip Right;Left    Right Hip Flexion 4/5    Right Hip Extension 2+/5    Right Hip ABduction 4-/5    Right Hip ADduction 3/5    Left Hip Flexion 5/5    Left Hip Extension 3+/5    Left Hip ABduction 4-/5  Left Hip ADduction 4-/5    Right/Left Knee Right;Left    Right Knee Flexion 2+/5    Right Knee Extension 4+/5    Left Knee Flexion 5/5    Left Knee Extension 5/5    Right/Left Ankle Right;Left    Right Ankle Dorsiflexion 4/5    Left Ankle Dorsiflexion 5/5             TREATMENT   Ther-ex Warm up onNuStep L2-4 x 5 minutes for warm-up during history (3 minutes unbilled); LE MMT (see results above); Supine SLR with 2# ankle weight (AW) 2 x 10 BLE; Supine straight leg hip abduction with manual resistance from therapist 2 x 10 BLE; Supine straight leg hip adduction with manual resistance from therapist 2 x 10 BLE; Hooklying bridges 2 x 10; Supine heel slide with resisted extension 2 x 10 BLE; Seated LAQ with manual resistance 2 x 10 BLE; Seated HS curls with green tband 2 x 10 BLE; Seated clams with manual resistance from  therapist 2 x 10; Seated adductor squeeze with manual resistance 2 x 10;   Pt educated throughout session about proper posture and technique with exercises. Improved exercise technique, movement at target joints, use of target muscles after min to mod verbal, visual, tactile cues.   Patient motivated and participated well within session. He was instructed in advanced LE strengthening today. MMT for BLE performed and goal updated today.Performed supine, hooklying, and seated exercises today. Pt continues to demonstrate decreased RLE strength compared to L side. He does require min VCs for proper positioning/exercise technique for optimal strengthening.He requires intermittent rest breaks provided due to fatigue. Pt denies any increase in flank pain during session today. He would benefit from addtional skilled PT intervention to improve strength, balance and mobility.                       PT Short Term Goals - 07/24/19 1132      PT SHORT TERM GOAL #1   Title Patient will be adherent to HEP at least 3x a week to improve functional strength and balance for better safety at home.    Baseline 05/12/19: "I do it some of the time." 07/22/19: Partially compliant    Time 4    Period Weeks    Status Partially Met    Target Date 08/14/19      PT SHORT TERM GOAL #2   Title Patient will increase BLE gross strength to 4/5 particularly in right hip as to improve functional strength for independent gait, increased standing tolerance and increased ADL ability    Baseline see visit note 05/12/19; 07/22/19: Deferred; 07/24/19: see note    Time 4    Period Weeks    Status Partially Met    Target Date 08/14/19      PT SHORT TERM GOAL #3   Title Patient will be mod I for all bed mobility including rolling and supine to sit without difficulty to increase independence at home.    Baseline 05/12/19: Able to roll to both directions and move from sidelying to sitting, more challenging rolling to  the R side and pushing up with RUE    Time 4    Period Weeks    Status Achieved             PT Long Term Goals - 07/24/19 1132      PT LONG TERM GOAL #1   Title Patient (> 24 years old) will complete five  times sit to stand test in < 15 seconds indicating an increased LE strength and improved balance.    Baseline 05/12/19: 16.3s; 07/22/19: 13.9s    Time 8    Period Weeks    Status Achieved      PT LONG TERM GOAL #2   Title Patient will demonstrate an improved Berg Balance Score of >48/56 as to demonstrate improved balance with ADLs such as sitting/standing and transfer balance and reduced fall risk.    Baseline 05/12/19: 47/56; 07/22/19: 45/56    Time 8    Period Weeks    Status Partially Met    Target Date 09/11/19      PT LONG TERM GOAL #3   Title Patient will increase six minute walk test distance to >1000 for progression to community ambulator and improve gait ability    Baseline 05/12/19: 800'; 07/22/19: 700'    Time 8    Period Weeks    Status On-going    Target Date 09/11/19      PT LONG TERM GOAL #4   Title Patient will tolerate 5 seconds of single leg stance without loss of balance to improve ability to get in and out of shower safely.    Baseline 05/12/19: 1-2s; 07/22/19: unchanged (1-2s), more difficulty on RLE    Time 8    Period Weeks    Status On-going    Target Date 09/11/19                 Plan - 07/24/19 1021    Clinical Impression Statement Patient motivated and participated well within session. He was instructed in advanced LE strengthening today. MMT for BLE performed and goal updated today. Performed supine, hooklying, and seated exercises today. Pt continues to demonstrate decreased RLE strength compared to L side. He does require min VCs for proper positioning/exercise technique for optimal strengthening. He requires intermittent rest breaks provided due to fatigue. Pt denies any increase in flank pain during session today. He would benefit from  addtional skilled PT intervention to improve strength, balance and mobility.    Personal Factors and Comorbidities Comorbidity 3+;Age;Time since onset of injury/illness/exacerbation    Comorbidities recent stroke, DM x2 (on medication), HTN (somewhat controlled), CKD, HLD, RA (worse in right hand)    Examination-Activity Limitations Bed Mobility;Caring for Others;Carry;Dressing;Lift;Locomotion Level;Reach Overhead;Squat;Stairs;Stand;Transfers    Examination-Participation Restrictions Church;Cleaning;Community Activity;Driving;Laundry;Meal Prep;School;Shop;Volunteer;Yard Work    Stability/Clinical Decision Making Stable/Uncomplicated    Rehab Potential Good    PT Frequency 2x / week    PT Duration 8 weeks    PT Treatment/Interventions ADLs/Self Care Home Management;Cryotherapy;Electrical Stimulation;Moist Heat;Gait training;Functional mobility Scientist, forensic;Therapeutic activities;Therapeutic exercise;Balance training;Neuromuscular re-education;Patient/family education;Orthotic Fit/Training;Energy conservation    PT Next Visit Plan Progress note (goals just updated). Continue with balance and strengthening    PT Home Exercise Plan will address next session    Consulted and Agree with Plan of Care Patient;Family member/caregiver    Family Member Consulted spouse           Patient will benefit from skilled therapeutic intervention in order to improve the following deficits and impairments:  Abnormal gait, Decreased balance, Decreased endurance, Decreased mobility, Difficulty walking, Impaired perceived functional ability, Cardiopulmonary status limiting activity, Decreased activity tolerance, Decreased safety awareness, Decreased strength  Visit Diagnosis: Muscle weakness (generalized)  Unsteadiness on feet     Problem List Patient Active Problem List   Diagnosis Date Noted   Stroke ([place]) 11/19/2018   Syncope 10/21/2015   Drug-induced pneumonitis 04/02/2015   COPD  (  chronic obstructive pulmonary disease) ([place]) 04/02/2015   Dyspnea 04/02/2015   COPD exacerbation ([place]) 03/11/2015   CAFL (chronic airflow limitation) (HCC) 08/14/2014   HLD (hyperlipidemia) 08/14/2014   BP (high blood pressure) 08/14/2014   Diabetes mellitus type 2, uncontrolled ([place]) 08/14/2014   Chronic kidney disease (CKD), stage III (moderate) 01/27/2014   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 [place], [place], [address] Phone: [phone]   Fax:  [phone]  Name: Isaac Stevens MRN: [id number] Date of Birth: 08/28/1935

## 2019-07-24 NOTE — Therapy (Signed)
[place] MAIN [facility] SERVICES [address] [place], [place], [address] Phone: [phone]   Fax:  [phone]  Occupational Therapy Treatment  Patient Details  Name: Isaac Stevens MRN: [id number] Date of Birth: 11/12/1935 Referring Provider (OT): Dorise Hiss, [place]   Encounter Date: 07/24/2019   OT End of Session - 07/24/19 0951    Visit Number 25    Number of Visits 20    Date for OT Re-Evaluation 08/20/19    Authorization Type FOTO   Progress report period starting 02/20/2019    OT Start Time 0932    OT Stop Time 1015    OT Time Calculation (min) 43 min    Activity Tolerance Patient tolerated treatment well    Behavior During Therapy [facility] for tasks assessed/performed           Past Medical History:  Diagnosis Date  . CAFL (chronic airflow limitation) (HCC)   . CKD (chronic kidney disease)   . Diabetes mellitus without complication ([place])   . Hyperlipidemia   . Hypertension   . Rheumatoid arthritis [facility])     Past Surgical History:  Procedure Laterality Date  . TOE SURGERY     right foot    There were no vitals filed for this visit.   Subjective Assessment - 07/24/19 0949    Subjective  Patient reports he has laid off on doing so much gripping and working more on extension the last few days.    Pertinent History Pt. is an 84 y.o. male who was initially admitted to [facility] for 24 hours following a CVA. Pt. was discharged, and one week later was admitted to [facility] with a CVA presenting with right sidedl weakness. Pt. received inpatient therapy services, and HHOT services upon discharge. Pt. is now ready for outpatient OT services.    Special Tests FOTO score: 43    Patient Stated Goals To regain the use of his right arm.    Currently in Pain? No/denies    Pain Score 0-No pain            Patient seen for A/AAROM of right UE performed this date in supine, shoulder flexion, ABD, elbow flexion/extension, supination/pronation,  wrist flexion/ext, digit flexion/ext.  Shoulder stabilization exercises with Shoulder place and hold with shoulder flexion to 90 degrees, shoulder protraction with guiding, triceps extension, shoulder forwards/backwards circles with arm placed in 90 degrees of shoulder flexion.   With shoulder flexion, patient tends to move in a flexion/ABD combo pattern and uses some substitution of movements. 2 sets 5 reps each for 1# dowel, shoulder flexion, chest press, protraction, circles, diagonal pattern Felt some stretching with diagonal pattern.   Single arm circle at 90 degrees with increased effort, 5 reps for 2 sets.  Velcro squares in sitting, mild resistance to remove square from velcro.  Cues to decrease compensatory movements at the shoulder, decrease shoulder hiking.  Cues for prehension patterns to pick up cubes to place onto board.  ROM wrist flexion/ext, supination , thumb flexion, extension, radial and palmar ABD of the thumb performed in sitting this date, therapist assist for guiding through the motions.    Response to tx:   Patient continues to progress with ROM, strength in RUE, added exercises in supine for shoulder stabilization exercises.  Patient decreasing amount of focus on grip strength in right hand to refocus attempts on finger extension and help balance muscle strength between flexors and extensors.  Patient does require cues to decrease substitutions of  movements at the shoulder with attempts for normal movement patterns.  Patient to continue with exercises at home and will reassess performance next session.  May need to issue additional handouts for shoulder stabilization exercises.  Continue OT to improve functional use of right UE for necessary daily tasks.                       OT Education - 07/24/19 0950    Education Details HEP    Person(s) Educated Patient    Methods Explanation;Demonstration    Comprehension Verbalized understanding;Returned demonstration                OT Long Term Goals - 05/29/19 1334      OT LONG TERM GOAL #1   Title Pt. will increase Right shoulder AROM  in order to be able to reach into a cabinet.    Baseline 20 degrees of isolated flexion. Pt. has difficulty reaching up into cabinets.    Time 12    Period Weeks    Status On-going    Target Date 08/20/19      OT LONG TERM GOAL #2   Title Pt. will increase right shoulder abduction to be able to independently wash his hair.    Baseline Pt. continues to present with limited abduction, and is unable to reach up to his head to wash his hair.    Time 12    Period Weeks    Status On-going    Target Date 08/20/19      OT LONG TERM GOAL #3   Title Pt. will perform self-feeding with his right hand with minA.    Baseline Pt. continues to require modA, and is able to assist with using a fork with the right hand  when eating an eggs.    Time 12    Period Weeks    Status On-going    Target Date 08/20/19      OT LONG TERM GOAL #4   Title Pt. will increase right wrist extension by 10 degrees in preparation for reaching for a glass.    Baseline Pt. has difficulty reaching for a glass.    Time 12    Period Weeks    Status On-going    Target Date 08/20/19      OT LONG TERM GOAL #5   Title Pt. will independently use his right hand to wash his face.    Baseline Pt. is now able to reach his face with his right hand. Pt.is unable to perfrome the washing motion.    Time 12    Period Weeks    Status On-going    Target Date 08/20/19      OT LONG TERM GOAL #6   Title Pt. will be independent with UE dressing    Baseline Pt. requires modA    Time 12    Period Weeks    Status On-going    Target Date 08/20/19      OT LONG TERM GOAL #7   Title Pt. will require minA LE dressing    Baseline Pt. continues to require minA donning socks. Pt. requires modA with pants, and shoes.    Time 12    Period Weeks    Status On-going    Target Date 08/20/19                  Plan - 07/24/19 0951    Clinical Impression Statement Patient continues to progress with ROM,  strength in RUE, added exercises in supine for shoulder stabilization exercises.  Patient decreasing amount of focus on grip strength in right hand to refocus attempts on finger extension and help balance muscle strength between flexors and extensors.  Patient does require cues to decrease substitutions of movements at the shoulder with attempts for normal movement patterns.  Patient to continue with exercises at home and will reassess performance next session.  May need to issue additional handouts for shoulder stabilization exercises.  Continue OT to improve functional use of right UE for necessary daily tasks.    Occupational performance deficits (Please refer to evaluation for details): ADL's;IADL's;Education    Body Structure / Function / Physical Skills ADL;FMC;IADL;UE functional use;Dexterity;Strength;ROM;Coordination    Rehab Potential Good    Clinical Decision Making Several treatment options, min-mod task modification necessary    Comorbidities Affecting Occupational Performance: May have comorbidities impacting occupational performance    Modification or Assistance to Complete Evaluation  Min-Moderate modification of tasks or assist with assess necessary to complete eval    OT Frequency 2x / week    OT Duration 12 weeks    OT Treatment/Interventions Self-care/ADL training;Patient/family education;Therapeutic activities;DME and/or AE instruction;Neuromuscular education;Therapeutic exercise    Consulted and Agree with Plan of Care Patient           Patient will benefit from skilled therapeutic intervention in order to improve the following deficits and impairments:   Body Structure / Function / Physical Skills: ADL, FMC, IADL, UE functional use, Dexterity, Strength, ROM, Coordination       Visit Diagnosis: Muscle weakness (generalized)  Other lack of  coordination    Problem List Patient Active Problem List   Diagnosis Date Noted  . Stroke ([place]) 11/19/2018  . Syncope 10/21/2015  . Drug-induced pneumonitis 04/02/2015  . COPD (chronic obstructive pulmonary disease) ([place]) 04/02/2015  . Dyspnea 04/02/2015  . COPD exacerbation ([place]) 03/11/2015  . CAFL (chronic airflow limitation) ([place]) 08/14/2014  . HLD (hyperlipidemia) 08/14/2014  . BP (high blood pressure) 08/14/2014  . Diabetes mellitus type 2, uncontrolled ([place]) 08/14/2014  . Chronic kidney disease (CKD), stage III (moderate) 01/27/2014  . 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 [place], [place], [address] Phone: [phone]   Fax:  [phone]  Name: CLEM WISENBAKER MRN: [id number] Date of Birth: 1935-05-14

## 2019-07-29 ENCOUNTER — Encounter: Payer: PPO | Admitting: Occupational Therapy

## 2019-07-29 ENCOUNTER — Ambulatory Visit: Payer: PPO

## 2019-07-31 ENCOUNTER — Ambulatory Visit: Payer: PPO | Admitting: Occupational Therapy

## 2019-07-31 ENCOUNTER — Other Ambulatory Visit: Payer: Self-pay

## 2019-07-31 ENCOUNTER — Encounter: Payer: Self-pay | Admitting: Occupational Therapy

## 2019-07-31 ENCOUNTER — Ambulatory Visit: Payer: PPO

## 2019-07-31 DIAGNOSIS — M6281 Muscle weakness (generalized): Secondary | ICD-10-CM

## 2019-07-31 DIAGNOSIS — R2681 Unsteadiness on feet: Secondary | ICD-10-CM

## 2019-07-31 DIAGNOSIS — R278 Other lack of coordination: Secondary | ICD-10-CM

## 2019-07-31 NOTE — Therapy (Addendum)
[place] MAIN [facility] SERVICES [address] [place], [place], [address] Phone: [phone]   Fax:  [phone]  Physical Therapy Progress Note   Dates of reporting period  05/19/19   to   07/31/19  Physical Therapy Treatment  Patient Details  Name: Isaac Stevens MRN: [id number] Date of Birth: Jun 10, 1935 Referring Provider (PT): Rachelle Hora, MD   Encounter Date: 07/31/2019   PT End of Session - 07/31/19 1025    Visit Number 20    Number of Visits 49    Date for PT Re-Evaluation 09/11/19    Authorization Type Pn 07/15/60, recertification 9/52/84    PT Start Time 1019    PT Stop Time 1059    PT Time Calculation (min) 40 min    Equipment Utilized During Treatment Gait belt    Activity Tolerance Patient tolerated treatment well;No increased pain;Patient limited by fatigue    Behavior During Therapy [facility] for tasks assessed/performed           Past Medical History:  Diagnosis Date  . CAFL (chronic airflow limitation) (HCC)   . CKD (chronic kidney disease)   . Diabetes mellitus without complication ([place])   . Hyperlipidemia   . Hypertension   . Rheumatoid arthritis [facility])     Past Surgical History:  Procedure Laterality Date  . TOE SURGERY     right foot    There were no vitals filed for this visit.   Subjective Assessment - 07/31/19 1024    Subjective Pt doing well this date. Denies any updates. Reports he does what he can remember of his HEP.    Pertinent History Pt. is an 84y.o. male who was initially admitted to [facility] for 24 hours following a CVA on 11/19/18. Pt. was discharged, and one week later was admitted to [facility] with a CVA presenting with right sided weakness. Pt. received inpatient therapy services, and [facility] rehab services upon discharge. He is now being referred for outpatient rehab. Patient has already started outpatient OT to address RUE weakness and ADLs. 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 He has also  been referred to orthopedics in January 2021 due to RUE frozen shoulder/weakness and pain. He presents to therapy with tripod base cane which he reports using intermittently to reduce fall risk. He denies any recent falls. He denies any numbness/tingling    Currently in Pain? No/denies           INTERVENTION THIS DATE: -NUSTEP reciprocal gait training, seat 10, Rt arm 12.5, Left arm 11, level 2 x 6 minutes, cues to attend to task when talking -Overground gait training, SPC LUE sequenced with RLE, sometimes in constant float phase, cued for multiple turns, provided visual distractions; Rt stance phase in 33% of Left side.  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       PT Short Term Goals - 07/24/19 1132      PT SHORT TERM GOAL #1   Title Patient will be adherent to HEP at least 3x a week to improve functional strength and balance for better safety at home.    Baseline 05/12/19: "I do it some of  the time." 07/22/19: Partially compliant    Time 4    Period Weeks    Status Partially Met    Target Date 08/14/19      PT SHORT TERM GOAL #2   Title Patient will increase BLE gross strength to 4/5 particularly in right hip as to improve functional strength for independent gait, increased standing tolerance and increased ADL ability    Baseline see visit note 05/12/19; 07/22/19: Deferred; 07/24/19: see note    Time 4    Period Weeks    Status Partially Met    Target Date 08/14/19      PT SHORT TERM GOAL #3   Title Patient will be mod I for all bed mobility including rolling and supine to sit without difficulty to increase independence at home.    Baseline 05/12/19: Able to roll to both  directions and move from sidelying to sitting, more challenging rolling to the R side and pushing up with RUE    Time 4    Period Weeks    Status Achieved             PT Long Term Goals - 07/24/19 1132      PT LONG TERM GOAL #1   Title Patient (> 38 years old) will complete five times sit to stand test in < 15 seconds indicating an increased LE strength and improved balance.    Baseline 05/12/19: 16.3s; 07/22/19: 13.9s    Time 8    Period Weeks    Status Achieved      PT LONG TERM GOAL #2   Title Patient will demonstrate an improved Berg Balance Score of >48/56 as to demonstrate improved balance with ADLs such as sitting/standing and transfer balance and reduced fall risk.    Baseline 05/12/19: 47/56; 07/22/19: 45/56    Time 8    Period Weeks    Status Partially Met    Target Date 09/11/19      PT LONG TERM GOAL #3   Title Patient will increase six minute walk test distance to >1000 for progression to community ambulator and improve gait ability    Baseline 05/12/19: 800'; 07/22/19: 700'    Time 8    Period Weeks    Status On-going    Target Date 09/11/19      PT LONG TERM GOAL #4   Title Patient will tolerate 5 seconds of single leg stance without loss of balance to improve ability to get in and out of shower safely.    Baseline 05/12/19: 1-2s; 07/22/19: unchanged (1-2s), more difficulty on RLE    Time 8    Period Weeks    Status On-going    Target Date 09/11/19                 Plan - 07/31/19 1026    Clinical Impression Statement Continued with current plan of care, gently progressing patient's program aimed at address deficits and limitations identified in evlauation. Able to focus on some functional IADL activity this date that patient recent has struggled with at home: putting air in his truck tires. Pt continues to make steady progress toward treatment goals in general. Author provides extensive verbal, visual, and tactile cues when needed to assure all interventions  are performed with desired form and good accuracy. Extensive communicaiton to assure pt is able to perform all activities without exacerbation of pain or other symptoms.    Personal Factors and Comorbidities Comorbidity 3+;Age;Time since onset of injury/illness/exacerbation  Comorbidities recent stroke, DM x2 (on medication), HTN (somewhat controlled), CKD, HLD, RA (worse in right hand)    Examination-Activity Limitations Bed Mobility;Caring for Others;Carry;Dressing;Lift;Locomotion Level;Reach Overhead;Squat;Stairs;Stand;Transfers    Examination-Participation Restrictions Church;Cleaning;Community Activity;Driving;Laundry;Meal Prep;School;Shop;Volunteer;Yard Work    Stability/Clinical Decision Making Stable/Uncomplicated    Clinical Decision Making Low    Rehab Potential Good    PT Frequency 2x / week    PT Duration 8 weeks    PT Treatment/Interventions ADLs/Self Care Home Management;Cryotherapy;Electrical Stimulation;Moist Heat;Gait training;Functional mobility Scientist, forensic;Therapeutic activities;Therapeutic exercise;Balance training;Neuromuscular re-education;Patient/family education;Orthotic Fit/Training;Energy conservation    PT Next Visit Plan Continue with progression in balance training, strength and conditioning    PT Home Exercise Plan ain't not been done yet    Consulted and Agree with Plan of Care Patient           Patient will benefit from skilled therapeutic intervention in order to improve the following deficits and impairments:  Abnormal gait, Decreased balance, Decreased endurance, Decreased mobility, Difficulty walking, Impaired perceived functional ability, Cardiopulmonary status limiting activity, Decreased activity tolerance, Decreased safety awareness, Decreased strength  Visit Diagnosis: Muscle weakness (generalized)  Unsteadiness on feet  Other lack of coordination     Problem List Patient Active Problem List   Diagnosis Date Noted  . Stroke  ([place]) 11/19/2018  . Syncope 10/21/2015  . Drug-induced pneumonitis 04/02/2015  . COPD (chronic obstructive pulmonary disease) ([place]) 04/02/2015  . Dyspnea 04/02/2015  . COPD exacerbation ([place]) 03/11/2015  . CAFL (chronic airflow limitation) ([place]) 08/14/2014  . HLD (hyperlipidemia) 08/14/2014  . BP (high blood pressure) 08/14/2014  . Diabetes mellitus type 2, uncontrolled ([place]) 08/14/2014  . Chronic kidney disease (CKD), stage III (moderate) 01/27/2014  . 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 [place], [place], [address] Phone: [phone]   Fax:  [phone]  Name: Isaac Stevens MRN: [id number] Date of Birth: May 11, 1935

## 2019-07-31 NOTE — Therapy (Signed)
[place] MAIN [facility] SERVICES [address] [place], [place], [address] Phone: [phone]   Fax:  [phone]  Occupational Therapy Treatment  Patient Details  Name: Isaac Stevens MRN: [id number] Date of Birth: 1936/01/01 Referring Provider (OT): Dorise Hiss, [place]   Encounter Date: 07/31/2019   OT End of Session - 07/31/19 1151    Visit Number 26    Number of Visits 54    Date for OT Re-Evaluation 08/20/19    Authorization Type FOTO   Progress report period starting 02/20/2019    OT Start Time 1100    OT Stop Time 1145    OT Time Calculation (min) 45 min    Activity Tolerance Patient tolerated treatment well    Behavior During Therapy [facility] for tasks assessed/performed           Past Medical History:  Diagnosis Date  . CAFL (chronic airflow limitation) (HCC)   . CKD (chronic kidney disease)   . Diabetes mellitus without complication ([place])   . Hyperlipidemia   . Hypertension   . Rheumatoid arthritis [facility])     Past Surgical History:  Procedure Laterality Date  . TOE SURGERY     right foot    There were no vitals filed for this visit.   Subjective Assessment - 07/31/19 1149    Subjective  Pt. reports that his side in feeling better    Patient is accompanied by: Family member    Pertinent History Pt. is an 84 y.o. male who was initially admitted to [facility] for 24 hours following a CVA. Pt. was discharged, and one week later was admitted to [facility] with a CVA presenting with right sidedl weakness. Pt. received inpatient therapy services, and HHOT services upon discharge. Pt. is now ready for outpatient OT services.    Currently in Pain? No/denies          OT TREATMENT    Neuro muscular re-education:   Pt. Tolerated weightbearing and proprioceptive input through his RUE, and hand in sitting at the mat to normalize tone, and prepare his right hand for improving extension, and extending off of objects. Pt. worked on alternating  weightbearing with actively using his right hand. Pt. Worked on reaching form cones positioned at various angles, and planes with emphasis placed on gross digit extension when releasing his grip from the cones. Pt. Worked on grasping one inch resistive cubes using a 2pt. Grasp pattern. The board was positioned at a vertical angle. Pt. Worked on pressing them back into place while isolating 2nd digit extension.  Therapeutic Exercise:  Pt. Tolerated AROM in all joint ranges of RUE, and hand with PROM stretching to the end ranges for right shoulder flexion, abduction, and 2nd, and 3rd digit MP, PIP, and DIP flexion.  Manual Therapy:  Pt. tolerated soft tissue mobilizations at the radius on ulna for supination, and metacarpal spread stretches to normalize tone, and prepare the UE for ROM, and functional use. Manual techniques were performed independent of There. Ex.   Pt. Reports that the pulled muscle in the right lateral aspect of his torso is feeling much better. Pt. Reports that it had really gotten him down for awhile, but has improved. Pt. continues to present with limited right hand full digit flexion, and extension. Pt. Requires cues to focus on fully extending his digits when releasing objects. Pt. Education was provided about neuroplasticity, motor relearning, and identifying opportunities to engage the RUE and hand during daily care tasks. Pt. continues  to work on improving, and increasing engagement of the RUE  during ADLs, and IADL tasks, and towards improving, and maximizing independence.                          OT Education - 07/31/19 1150    Education Details Digit extension    Person(s) Educated Patient    Methods Explanation;Demonstration    Comprehension Verbalized understanding;Returned demonstration               OT Long Term Goals - 05/29/19 1334      OT LONG TERM GOAL #1   Title Pt. will increase Right shoulder AROM  in order to be able to reach  into a cabinet.    Baseline 20 degrees of isolated flexion. Pt. has difficulty reaching up into cabinets.    Time 12    Period Weeks    Status On-going    Target Date 08/20/19      OT LONG TERM GOAL #2   Title Pt. will increase right shoulder abduction to be able to independently wash his hair.    Baseline Pt. continues to present with limited abduction, and is unable to reach up to his head to wash his hair.    Time 12    Period Weeks    Status On-going    Target Date 08/20/19      OT LONG TERM GOAL #3   Title Pt. will perform self-feeding with his right hand with minA.    Baseline Pt. continues to require modA, and is able to assist with using a fork with the right hand  when eating an eggs.    Time 12    Period Weeks    Status On-going    Target Date 08/20/19      OT LONG TERM GOAL #4   Title Pt. will increase right wrist extension by 10 degrees in preparation for reaching for a glass.    Baseline Pt. has difficulty reaching for a glass.    Time 12    Period Weeks    Status On-going    Target Date 08/20/19      OT LONG TERM GOAL #5   Title Pt. will independently use his right hand to wash his face.    Baseline Pt. is now able to reach his face with his right hand. Pt.is unable to perfrome the washing motion.    Time 12    Period Weeks    Status On-going    Target Date 08/20/19      OT LONG TERM GOAL #6   Title Pt. will be independent with UE dressing    Baseline Pt. requires modA    Time 12    Period Weeks    Status On-going    Target Date 08/20/19      OT LONG TERM GOAL #7   Title Pt. will require minA LE dressing    Baseline Pt. continues to require minA donning socks. Pt. requires modA with pants, and shoes.    Time 12    Period Weeks    Status On-going    Target Date 08/20/19                 Plan - 07/31/19 1152    Clinical Impression Statement Pt. Reports that the pulled muscle in the right lateral aspect of his torso is feeling much better.  Pt. Reports that it had really gotten him down for awhile,  but has improved. Pt. continues to present with limited right hand full digit flexion, and extension. Pt. Requires cues to focus on fully extending his digits when releasing objects. Pt. Education was provided about neuroplasticity, motor relearning, and identifying opportunities to engage the RUE and hand during daily care tasks. Pt. continues to work on improving, and increasing engagement of the RUE  during ADLs, and IADL tasks, and towards improving, and maximizing independence.    Occupational performance deficits (Please refer to evaluation for details): ADL's;IADL's;Education    Body Structure / Function / Physical Skills ADL;FMC;IADL;UE functional use;Dexterity;Strength;ROM;Coordination    Rehab Potential Good    Clinical Decision Making Several treatment options, min-mod task modification necessary    Comorbidities Affecting Occupational Performance: May have comorbidities impacting occupational performance    Modification or Assistance to Complete Evaluation  Min-Moderate modification of tasks or assist with assess necessary to complete eval    OT Frequency 2x / week    OT Duration 12 weeks    OT Treatment/Interventions Self-care/ADL training;Patient/family education;Therapeutic activities;DME and/or AE instruction;Neuromuscular education;Therapeutic exercise    Consulted and Agree with Plan of Care Patient           Patient will benefit from skilled therapeutic intervention in order to improve the following deficits and impairments:   Body Structure / Function / Physical Skills: ADL, FMC, IADL, UE functional use, Dexterity, Strength, ROM, Coordination       Visit Diagnosis: Muscle weakness (generalized)  Other lack of coordination    Problem List Patient Active Problem List   Diagnosis Date Noted  . Stroke ([place]) 11/19/2018  . Syncope 10/21/2015  . Drug-induced pneumonitis 04/02/2015  . COPD (chronic obstructive  pulmonary disease) ([place]) 04/02/2015  . Dyspnea 04/02/2015  . COPD exacerbation ([place]) 03/11/2015  . CAFL (chronic airflow limitation) ([place]) 08/14/2014  . HLD (hyperlipidemia) 08/14/2014  . BP (high blood pressure) 08/14/2014  . Diabetes mellitus type 2, uncontrolled ([place]) 08/14/2014  . Chronic kidney disease (CKD), stage III (moderate) 01/27/2014  . 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 [place], [place], [address] Phone: [phone]   Fax:  [phone]  Name: DANY WALTHER MRN: [id number] Date of Birth: 11/28/1935

## 2019-08-05 ENCOUNTER — Other Ambulatory Visit: Payer: Self-pay

## 2019-08-05 ENCOUNTER — Ambulatory Visit: Payer: PPO

## 2019-08-05 ENCOUNTER — Ambulatory Visit: Payer: PPO | Admitting: Occupational Therapy

## 2019-08-05 DIAGNOSIS — M6281 Muscle weakness (generalized): Secondary | ICD-10-CM

## 2019-08-05 DIAGNOSIS — R2681 Unsteadiness on feet: Secondary | ICD-10-CM

## 2019-08-05 DIAGNOSIS — R278 Other lack of coordination: Secondary | ICD-10-CM

## 2019-08-05 NOTE — Therapy (Signed)
[place] MAIN [facility] SERVICES [address] [place], [place], [address] Phone: [phone]   Fax:  [phone]  Physical Therapy Treatment  Patient Details  Name: Isaac Stevens MRN: [id number] Date of Birth: 25-Oct-1935 Referring Provider (PT): Rachelle Hora, MD   Encounter Date: 08/05/2019   PT End of Session - 08/05/19 1028    Visit Number 21    Number of Visits 49    Date for PT Re-Evaluation 09/11/19    Authorization Type Pn 09/08/78, recertification 0/34/91    PT Start Time 1025    PT Stop Time 1100    PT Time Calculation (min) 35 min    Equipment Utilized During Treatment Gait belt    Activity Tolerance Patient tolerated treatment well;No increased pain;Patient limited by fatigue    Behavior During Therapy [facility] for tasks assessed/performed           Past Medical History:  Diagnosis Date  . CAFL (chronic airflow limitation) (HCC)   . CKD (chronic kidney disease)   . Diabetes mellitus without complication ([place])   . Hyperlipidemia   . Hypertension   . Rheumatoid arthritis [facility])     Past Surgical History:  Procedure Laterality Date  . TOE SURGERY     right foot    There were no vitals filed for this visit.   Subjective Assessment - 08/05/19 1022    Subjective Pt doing well this date. Denies any updates. Reports he does what he can remember of his HEP.    Pertinent History Pt. is an 84y.o. male who was initially admitted to [facility] for 24 hours following a CVA on 11/19/18. Pt. was discharged, and one week later was admitted to [facility] with a CVA presenting with right sided weakness. Pt. received inpatient therapy services, and [facility] rehab services upon discharge. He is now being referred for outpatient rehab. Patient has already started outpatient OT to address RUE weakness and ADLs. 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 He has also been referred to orthopedics in January 2021 due to RUE frozen shoulder/weakness and  pain. He presents to therapy with tripod base cane which he reports using intermittently to reduce fall risk. He denies any recent falls. He denies any numbness/tingling    Limitations Standing;Walking    How long can you sit comfortably? NA    How long can you stand comfortably? 5-10 min, with fatigue    How long can you walk comfortably? 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 Yes    Pain Score 2     Pain Location Flank    Pain Orientation Right    Pain Descriptors / Indicators Aching    Pain Type Acute pain    Pain Onset More than a month ago                 TREATMENT   Ther-ex Warm up onNuStep L2-3 x 5 minutes for warm-up (unbilled); Seated marches with 3# ankle weight (AW) 2 x 10 BLE; Seated LAQ with 3# AW 2 x 10 BLE; Seated clams with manual resistance from therapist 2 x 10; Seated adductor squeeze with manual resistance 2 x 10;  Standing exercises with 3# AW: Marches 2 x 10 BLE; Hip abduction 2 x 10 BLE; Hip extension 2 x 10  BLE;   Neuromuscular Re-education  6" alternating forward step taps without UE support x 10 BLE; 6" alternating lateral step taps without UE support x 10 BLE; NBOS eyes open/closed x 30s each; Heel/toe rocking with BUE support x 10 each direction, limited DF on RLE;   Pt educated throughout session about proper posture and technique with exercises. Improved exercise technique, movement at target joints, use of target muscles after min to mod verbal, visual, tactile cues.   Patient arrived a little late for appointment so session was somewhat abbreviated. He was motivated and participated well within session. He continues to report some R flank pain upon arrival but no increase during session. He was instructed in advanced LE strengthening today. Pt continues to demonstrate  decreased RLE strength compared to L side.He does require min VCs for proper positioning/exercise technique for optimal strengthening.He requires intermittent rest breaks provided due to fatigue. He would benefit from addtional skilled PT intervention to improve strength, balance and mobility.                       PT Short Term Goals - 07/24/19 1132      PT SHORT TERM GOAL #1   Title Patient will be adherent to HEP at least 3x a week to improve functional strength and balance for better safety at home.    Baseline 05/12/19: "I do it some of the time." 07/22/19: Partially compliant    Time 4    Period Weeks    Status Partially Met    Target Date 08/14/19      PT SHORT TERM GOAL #2   Title Patient will increase BLE gross strength to 4/5 particularly in right hip as to improve functional strength for independent gait, increased standing tolerance and increased ADL ability    Baseline see visit note 05/12/19; 07/22/19: Deferred; 07/24/19: see note    Time 4    Period Weeks    Status Partially Met    Target Date 08/14/19      PT SHORT TERM GOAL #3   Title Patient will be mod I for all bed mobility including rolling and supine to sit without difficulty to increase independence at home.    Baseline 05/12/19: Able to roll to both directions and move from sidelying to sitting, more challenging rolling to the R side and pushing up with RUE    Time 4    Period Weeks    Status Achieved             PT Long Term Goals - 07/24/19 1132      PT LONG TERM GOAL #1   Title Patient (> 11 years old) will complete five times sit to stand test in < 15 seconds indicating an increased LE strength and improved balance.    Baseline 05/12/19: 16.3s; 07/22/19: 13.9s    Time 8    Period Weeks    Status Achieved      PT LONG TERM GOAL #2   Title Patient will demonstrate an improved Berg Balance Score of >48/56 as to demonstrate improved balance with ADLs such as sitting/standing and transfer  balance and reduced fall risk.    Baseline 05/12/19: 47/56; 07/22/19: 45/56    Time 8    Period Weeks    Status Partially Met    Target Date 09/11/19      PT LONG TERM GOAL #3   Title Patient will increase six minute walk test distance to >1000 for progression  to community ambulator and improve gait ability    Baseline 05/12/19: 800'; 07/22/19: 700'    Time 8    Period Weeks    Status On-going    Target Date 09/11/19      PT LONG TERM GOAL #4   Title Patient will tolerate 5 seconds of single leg stance without loss of balance to improve ability to get in and out of shower safely.    Baseline 05/12/19: 1-2s; 07/22/19: unchanged (1-2s), more difficulty on RLE    Time 8    Period Weeks    Status On-going    Target Date 09/11/19                 Plan - 08/05/19 1028    Clinical Impression Statement Patient arrived a little late for appointment so session was somewhat abbreviated. He was motivated and participated well within session. He continues to report some R flank pain upon arrival but no increase during session. He was instructed in advanced LE strengthening today. Pt continues to demonstrate decreased RLE strength compared to L side. He does require min VCs for proper positioning/exercise technique for optimal strengthening. He requires intermittent rest breaks provided due to fatigue. He would benefit from addtional skilled PT intervention to improve strength, balance and mobility.    Personal Factors and Comorbidities Comorbidity 3+;Age;Time since onset of injury/illness/exacerbation    Comorbidities recent stroke, DM x2 (on medication), HTN (somewhat controlled), CKD, HLD, RA (worse in right hand)    Examination-Activity Limitations Bed Mobility;Caring for Others;Carry;Dressing;Lift;Locomotion Level;Reach Overhead;Squat;Stairs;Stand;Transfers    Examination-Participation Restrictions Church;Cleaning;Community Activity;Driving;Laundry;Meal Prep;School;Shop;Volunteer;Yard Work     Stability/Clinical Decision Making Stable/Uncomplicated    Rehab Potential Good    PT Frequency 2x / week    PT Duration 8 weeks    PT Treatment/Interventions ADLs/Self Care Home Management;Cryotherapy;Electrical Stimulation;Moist Heat;Gait training;Functional mobility Scientist, forensic;Therapeutic activities;Therapeutic exercise;Balance training;Neuromuscular re-education;Patient/family education;Orthotic Fit/Training;Energy conservation    PT Next Visit Plan Continue with progression in balance training, strength and conditioning    PT Home Exercise Plan ain't not been done yet    Consulted and Agree with Plan of Care Patient           Patient will benefit from skilled therapeutic intervention in order to improve the following deficits and impairments:  Abnormal gait, Decreased balance, Decreased endurance, Decreased mobility, Difficulty walking, Impaired perceived functional ability, Cardiopulmonary status limiting activity, Decreased activity tolerance, Decreased safety awareness, Decreased strength  Visit Diagnosis: Muscle weakness (generalized)  Unsteadiness on feet     Problem List Patient Active Problem List   Diagnosis Date Noted  . Stroke ([place]) 11/19/2018  . Syncope 10/21/2015  . Drug-induced pneumonitis 04/02/2015  . COPD (chronic obstructive pulmonary disease) ([place]) 04/02/2015  . Dyspnea 04/02/2015  . COPD exacerbation ([place]) 03/11/2015  . CAFL (chronic airflow limitation) ([place]) 08/14/2014  . HLD (hyperlipidemia) 08/14/2014  . BP (high blood pressure) 08/14/2014  . Diabetes mellitus type 2, uncontrolled ([place]) 08/14/2014  . Chronic kidney disease (CKD), stage III (moderate) 01/27/2014  . 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 [place], [place], [address] Phone: [phone]   Fax:  [phone]  Name:  Isaac Stevens MRN: [id number] Date of Birth: 25-Dec-1935

## 2019-08-06 ENCOUNTER — Other Ambulatory Visit: Payer: Self-pay

## 2019-08-06 ENCOUNTER — Ambulatory Visit
Admission: RE | Admit: 2019-08-06 | Discharge: 2019-08-06 | Disposition: A | Discharge status: Home / Self Care | Payer: PPO | Source: Ambulatory Visit | Attending: Internal Medicine | Admitting: Internal Medicine

## 2019-08-06 ENCOUNTER — Encounter: Payer: Self-pay | Admitting: Occupational Therapy

## 2019-08-06 DIAGNOSIS — N1831 Chronic kidney disease, stage 3a: Secondary | ICD-10-CM | POA: Insufficient documentation

## 2019-08-06 DIAGNOSIS — N281 Cyst of kidney, acquired: Secondary | ICD-10-CM | POA: Diagnosis not present

## 2019-08-06 DIAGNOSIS — D7389 Other diseases of spleen: Secondary | ICD-10-CM | POA: Diagnosis not present

## 2019-08-06 DIAGNOSIS — I10 Essential (primary) hypertension: Secondary | ICD-10-CM

## 2019-08-06 DIAGNOSIS — N183 Chronic kidney disease, stage 3 unspecified: Secondary | ICD-10-CM | POA: Diagnosis not present

## 2019-08-06 DIAGNOSIS — D734 Cyst of spleen: Secondary | ICD-10-CM | POA: Diagnosis not present

## 2019-08-06 NOTE — Therapy (Signed)
[place] MAIN [facility] SERVICES [address] [place], [place], [address] Phone: [phone]   Fax:  [phone]  Occupational Therapy Treatment  Patient Details  Name: Isaac Stevens MRN: [id number] Date of Birth: 1935/04/15 Referring Provider (OT): Dorise Hiss, [place]   Encounter Date: 08/05/2019   OT End of Session - 08/06/19 0855    Visit Number 27    Number of Visits 63    Date for OT Re-Evaluation 08/20/19    Authorization Type FOTO   Progress report period starting 02/20/2019    OT Start Time 1106    OT Stop Time 1145    OT Time Calculation (min) 39 min    Activity Tolerance Patient tolerated treatment well    Behavior During Therapy [facility] for tasks assessed/performed           Past Medical History:  Diagnosis Date  . CAFL (chronic airflow limitation) (HCC)   . CKD (chronic kidney disease)   . Diabetes mellitus without complication ([place])   . Hyperlipidemia   . Hypertension   . Rheumatoid arthritis [facility])     Past Surgical History:  Procedure Laterality Date  . TOE SURGERY     right foot    There were no vitals filed for this visit.   Subjective Assessment - 08/06/19 0854    Subjective  Pt. reports that his side in feeling a little better this morning.    Patient is accompanied by: Family member    Pertinent History Pt. is an 84 y.o. male who was initially admitted to [facility] for 24 hours following a CVA. Pt. was discharged, and one week later was admitted to [facility] with a CVA presenting with right sidedl weakness. Pt. received inpatient therapy services, and HHOT services upon discharge. Pt. is now ready for outpatient OT services.    Currently in Pain? Yes    Pain Score 2     Pain Location Flank    Pain Orientation Right    Pain Descriptors / Indicators Aching    Pain Type Acute pain          OT TREATMENT    Therapeutic Exercise:   Pt. performed scapular elevation, depression, abduction/rotation, AROM, with AAROM for  shoulder flexion, abduction, AROM for elbow flexion, extension, supination, and wrist extension.   Selfcare:  Pt. worked on Media planner. Pt. was able to formulate his signature both in printed, and cursive form with 75&% legibility. Pt. was unable to complete longer words. Pt. had difficulty maintaining a mature tripod grasp on a wide width pen. Pt. had difficulty with distal digit mobility during writing, and tended to formulate the letters while using his whole UE as one unit.Pt. worked on pre writing exercises with the pen in his fingers.  Manual Therapy:  Pt. tolerated retrograde massage to the right hand for edema control. Pt. edema circumferential measurements  Prior to manual therapy: wrist: 20cm, metacarpals 24cm. After manual techniques it improved by 1 cm each to wrist: 19 cm, Metacarpals: 23 cm. Pt. Tolerated soft tissue mobilizations for metacarpal spread stretches. Pt education was provided about retrograde massage for edema control. Manual techniques were performed independent of Therapeutic Ex.  Pt. reports that his right sided flank pain has improved. Pt. Has a follow up appointment this week. Pt. Is improving with his right UE functioning, and continues to try to engage it more during ADL, and IADL tasks at home. Pt. has improved edema by 1 cm at his wrist, and metacarpals  following edema manual control techniques. Pt. Is improving with writing his name legibly to be able to sign important documents. Pt. Continues to work on maintaining a mature pen grasp when writing, increasing distal mobility needed to move the pen across the paper when formulating letters. Pt. continues to benefit from OT services to improve writing legibility, improve edema, and improve RUE functioning during ADLs, and IADLs.                              OT Long Term Goals - 05/29/19 1334      OT LONG TERM GOAL #1   Title Pt. will increase Right shoulder AROM  in order to be able  to reach into a cabinet.    Baseline 20 degrees of isolated flexion. Pt. has difficulty reaching up into cabinets.    Time 12    Period Weeks    Status On-going    Target Date 08/20/19      OT LONG TERM GOAL #2   Title Pt. will increase right shoulder abduction to be able to independently wash his hair.    Baseline Pt. continues to present with limited abduction, and is unable to reach up to his head to wash his hair.    Time 12    Period Weeks    Status On-going    Target Date 08/20/19      OT LONG TERM GOAL #3   Title Pt. will perform self-feeding with his right hand with minA.    Baseline Pt. continues to require modA, and is able to assist with using a fork with the right hand  when eating an eggs.    Time 12    Period Weeks    Status On-going    Target Date 08/20/19      OT LONG TERM GOAL #4   Title Pt. will increase right wrist extension by 10 degrees in preparation for reaching for a glass.    Baseline Pt. has difficulty reaching for a glass.    Time 12    Period Weeks    Status On-going    Target Date 08/20/19      OT LONG TERM GOAL #5   Title Pt. will independently use his right hand to wash his face.    Baseline Pt. is now able to reach his face with his right hand. Pt.is unable to perfrome the washing motion.    Time 12    Period Weeks    Status On-going    Target Date 08/20/19      OT LONG TERM GOAL #6   Title Pt. will be independent with UE dressing    Baseline Pt. requires modA    Time 12    Period Weeks    Status On-going    Target Date 08/20/19      OT LONG TERM GOAL #7   Title Pt. will require minA LE dressing    Baseline Pt. continues to require minA donning socks. Pt. requires modA with pants, and shoes.    Time 12    Period Weeks    Status On-going    Target Date 08/20/19                 Plan - 08/06/19 0855    Clinical Impression Statement Pt. reports that his right sided flank pain has improved. Pt. Has a follow up appointment  this week. Pt. Is improving with his right  UE functioning, and continues to try to engage it more during ADL, and IADL tasks at home. Pt. has improved edema by 1 cm at his wrist, and metacarpals following edema manual control techniques. Pt. Is improving with writing his name legibly to be able to sign important documents. Pt. Continues to work on maintaining a mature pen grasp when writing, increasing distal mobility needed to move the pen across the paper when formulating letters. Pt. continues to benefit from OT services to improve writing legibility, improve edema, and improve RUE functioning during ADLs, and IADLs.   Occupational performance deficits (Please refer to evaluation for details): ADL's;IADL's;Education    Body Structure / Function / Physical Skills ADL;FMC;IADL;UE functional use;Dexterity;Strength;ROM;Coordination    Rehab Potential Good    Clinical Decision Making Several treatment options, min-mod task modification necessary    Comorbidities Affecting Occupational Performance: May have comorbidities impacting occupational performance    Modification or Assistance to Complete Evaluation  Min-Moderate modification of tasks or assist with assess necessary to complete eval    OT Frequency 2x / week    OT Duration 12 weeks    OT Treatment/Interventions Self-care/ADL training;Patient/family education;Therapeutic activities;DME and/or AE instruction;Neuromuscular education;Therapeutic exercise    Consulted and Agree with Plan of Care Patient           Patient will benefit from skilled therapeutic intervention in order to improve the following deficits and impairments:   Body Structure / Function / Physical Skills: ADL, FMC, IADL, UE functional use, Dexterity, Strength, ROM, Coordination       Visit Diagnosis: Muscle weakness (generalized)  Other lack of coordination    Problem List Patient Active Problem List   Diagnosis Date Noted  . Stroke ([place]) 11/19/2018  . Syncope  10/21/2015  . Drug-induced pneumonitis 04/02/2015  . COPD (chronic obstructive pulmonary disease) ([place]) 04/02/2015  . Dyspnea 04/02/2015  . COPD exacerbation ([place]) 03/11/2015  . CAFL (chronic airflow limitation) ([place]) 08/14/2014  . HLD (hyperlipidemia) 08/14/2014  . BP (high blood pressure) 08/14/2014  . Diabetes mellitus type 2, uncontrolled ([place]) 08/14/2014  . Chronic kidney disease (CKD), stage III (moderate) 01/27/2014  . 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 [place], [place], [address] Phone: [phone]   Fax:  [phone]  Name: Isaac Stevens MRN: [id number] Date of Birth: 29-Jul-1935

## 2019-08-07 ENCOUNTER — Ambulatory Visit: Payer: PPO | Admitting: Occupational Therapy

## 2019-08-07 ENCOUNTER — Encounter: Payer: Self-pay | Admitting: Occupational Therapy

## 2019-08-07 ENCOUNTER — Ambulatory Visit: Payer: PPO | Attending: Orthopedic Surgery

## 2019-08-07 DIAGNOSIS — M6281 Muscle weakness (generalized): Secondary | ICD-10-CM

## 2019-08-07 DIAGNOSIS — R2681 Unsteadiness on feet: Secondary | ICD-10-CM | POA: Insufficient documentation

## 2019-08-07 DIAGNOSIS — R278 Other lack of coordination: Secondary | ICD-10-CM | POA: Insufficient documentation

## 2019-08-07 NOTE — Therapy (Signed)
[place] MAIN [facility] SERVICES [address] [place], [place], [address] Phone: [phone]   Fax:  [phone]  Physical Therapy Treatment  Patient Details  Name: Isaac Stevens MRN: [id number] Date of Birth: Jun 02, 1935 Referring Provider (PT): Rachelle Hora, MD   Encounter Date: 08/07/2019   PT End of Session - 08/07/19 1026    Visit Number 22    Number of Visits 49    Date for PT Re-Evaluation 09/11/19    Authorization Type Pn 04/08/42, recertification 0/10/27    PT Start Time 1022    PT Stop Time 1100    PT Time Calculation (min) 38 min    Equipment Utilized During Treatment Gait belt    Activity Tolerance Patient tolerated treatment well;No increased pain;Patient limited by fatigue    Behavior During Therapy [facility] for tasks assessed/performed           Past Medical History:  Diagnosis Date  . CAFL (chronic airflow limitation) (HCC)   . CKD (chronic kidney disease)   . Diabetes mellitus without complication ([place])   . Hyperlipidemia   . Hypertension   . Rheumatoid arthritis [facility])     Past Surgical History:  Procedure Laterality Date  . TOE SURGERY     right foot    There were no vitals filed for this visit.   Subjective Assessment - 08/07/19 1342    Subjective Pt doing well this date. Denies any updates. Denies any stumbles or falls since last therapy session. Reports that his flank pain is improving and states that it is only 1-2/10 upon arrivla today. No specific questions or concerns upon arrival.    Pertinent History Pt. is an 84y.o. male who was initially admitted to [facility] for 24 hours following a CVA on 11/19/18. Pt. was discharged, and one week later was admitted to [facility] with a CVA presenting with right sided weakness. Pt. received inpatient therapy services, and [facility] rehab services upon discharge. He is now being referred for outpatient rehab. Patient has already started outpatient OT to address RUE weakness and ADLs. 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 He has also been referred to orthopedics in January 2021 due to RUE frozen shoulder/weakness and pain. He presents to therapy with tripod base cane which he reports using intermittently to reduce fall risk. He denies any recent falls. He denies any numbness/tingling    Limitations Standing;Walking    How long can you sit comfortably? NA    How long can you stand comfortably? 5-10 min, with fatigue    How long can you walk comfortably? 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 Yes    Pain Score 2     Pain Location Flank    Pain Orientation Right    Pain Descriptors / Indicators Aching    Pain Type Acute pain    Pain Onset More than a month ago    Pain Frequency Intermittent             TREATMENT   Ther-ex Seated marches with 3# ankle weight (AW) 2 x 10 BLE; Seated LAQ with 3# AW 2 x 10 BLE; Seated clams with green tband resistance 2 x 10; Seated adductor squeeze with ball between knees, 3s hold, 2 x 10;  Standing exercises with 3#  AW: Marches 2 x 10 BLE; Hip abduction 2 x 10 BLE; Hip extension 2 x 10 BLE;   Neuromuscular Re-education  Orange hurdle forward step over without UE support x 10 leading with each side 4 square stepping x 2 laps clockwise and counterclockwise; 6" alternating forward step taps without UE support x 10 BLE;   Pt educated throughout session about proper posture and technique with exercises. Improved exercise technique, movement at target joints, use of target muscles after min to mod verbal, visual, tactile cues.   Pt was motivated and participated well within session. He continues to report some R flank pain upon arrival but it is improving and does not increase during session. He was instructed in advanced LE strengthening today as well as  balance exercises. Pt continues to demonstrate decreased RLE strength compared to L side.He does require min VCs for proper positioning/exercise technique for optimal strengthening.He requires intermittent rest breaks provided due to fatigue. He struggles with single leg balance especially when lifting RLE for step-overs and toe taps. He would benefit from addtional skilled PT intervention to improve strength, balance and mobility.                             PT Short Term Goals - 07/24/19 1132      PT SHORT TERM GOAL #1   Title Patient will be adherent to HEP at least 3x a week to improve functional strength and balance for better safety at home.    Baseline 05/12/19: "I do it some of the time." 07/22/19: Partially compliant    Time 4    Period Weeks    Status Partially Met    Target Date 08/14/19      PT SHORT TERM GOAL #2   Title Patient will increase BLE gross strength to 4/5 particularly in right hip as to improve functional strength for independent gait, increased standing tolerance and increased ADL ability    Baseline see visit note 05/12/19; 07/22/19: Deferred; 07/24/19: see note    Time 4    Period Weeks    Status Partially Met    Target Date 08/14/19      PT SHORT TERM GOAL #3   Title Patient will be mod I for all bed mobility including rolling and supine to sit without difficulty to increase independence at home.    Baseline 05/12/19: Able to roll to both directions and move from sidelying to sitting, more challenging rolling to the R side and pushing up with RUE    Time 4    Period Weeks    Status Achieved             PT Long Term Goals - 07/24/19 1132      PT LONG TERM GOAL #1   Title Patient (> 60 years old) will complete five times sit to stand test in < 15 seconds indicating an increased LE strength and improved balance.    Baseline 05/12/19: 16.3s; 07/22/19: 13.9s    Time 8    Period Weeks    Status Achieved      PT LONG TERM GOAL #2    Title Patient will demonstrate an improved Berg Balance Score of >48/56 as to demonstrate improved balance with ADLs such as sitting/standing and transfer balance and reduced fall risk.    Baseline 05/12/19: 47/56; 07/22/19: 45/56    Time 8    Period Weeks    Status Partially Met  Target Date 09/11/19      PT LONG TERM GOAL #3   Title Patient will increase six minute walk test distance to >1000 for progression to community ambulator and improve gait ability    Baseline 05/12/19: 800'; 07/22/19: 700'    Time 8    Period Weeks    Status On-going    Target Date 09/11/19      PT LONG TERM GOAL #4   Title Patient will tolerate 5 seconds of single leg stance without loss of balance to improve ability to get in and out of shower safely.    Baseline 05/12/19: 1-2s; 07/22/19: unchanged (1-2s), more difficulty on RLE    Time 8    Period Weeks    Status On-going    Target Date 09/11/19                 Plan - 08/07/19 1026    Clinical Impression Statement Pt was motivated and participated well within session. He continues to report some R flank pain upon arrival but it is improving and does not increase during session. He was instructed in advanced LE strengthening today as well as balance exercises. Pt continues to demonstrate decreased RLE strength compared to L side. He does require min VCs for proper positioning/exercise technique for optimal strengthening. He requires intermittent rest breaks provided due to fatigue. He struggles with single leg balance especially when lifting RLE for step-overs and toe taps. He would benefit from addtional skilled PT intervention to improve strength, balance and mobility.    Personal Factors and Comorbidities Comorbidity 3+;Age;Time since onset of injury/illness/exacerbation    Comorbidities recent stroke, DM x2 (on medication), HTN (somewhat controlled), CKD, HLD, RA (worse in right hand)    Examination-Activity Limitations Bed Mobility;Caring for  Others;Carry;Dressing;Lift;Locomotion Level;Reach Overhead;Squat;Stairs;Stand;Transfers    Examination-Participation Restrictions Church;Cleaning;Community Activity;Driving;Laundry;Meal Prep;School;Shop;Volunteer;Yard Work    Stability/Clinical Decision Making Stable/Uncomplicated    Clinical Decision Making Low    Rehab Potential Good    PT Frequency 2x / week    PT Duration 8 weeks    PT Treatment/Interventions ADLs/Self Care Home Management;Cryotherapy;Electrical Stimulation;Moist Heat;Gait training;Functional mobility Scientist, forensic;Therapeutic activities;Therapeutic exercise;Balance training;Neuromuscular re-education;Patient/family education;Orthotic Fit/Training;Energy conservation    PT Next Visit Plan Continue with progression in balance training, strength and conditioning    PT Home Exercise Plan ain't not been done yet    Consulted and Agree with Plan of Care Patient           Patient will benefit from skilled therapeutic intervention in order to improve the following deficits and impairments:  Abnormal gait, Decreased balance, Decreased endurance, Decreased mobility, Difficulty walking, Impaired perceived functional ability, Cardiopulmonary status limiting activity, Decreased activity tolerance, Decreased safety awareness, Decreased strength  Visit Diagnosis: Muscle weakness (generalized)  Unsteadiness on feet     Problem List Patient Active Problem List   Diagnosis Date Noted  . Stroke ([place]) 11/19/2018  . Syncope 10/21/2015  . Drug-induced pneumonitis 04/02/2015  . COPD (chronic obstructive pulmonary disease) ([place]) 04/02/2015  . Dyspnea 04/02/2015  . COPD exacerbation ([place]) 03/11/2015  . CAFL (chronic airflow limitation) ([place]) 08/14/2014  . HLD (hyperlipidemia) 08/14/2014  . BP (high blood pressure) 08/14/2014  . Diabetes mellitus type 2, uncontrolled ([place]) 08/14/2014  . Chronic kidney disease (CKD), stage III (moderate) 01/27/2014  . Cutaneous malignant  melanoma ([place]) 11/05/2013   Phillips Grout PT, DPT, GCS  Latha Staunton 08/07/2019, 1:49 PM  [place] MAIN [facility] SERVICES [place], [place],  [place] Phone: [phone]   Fax:  [phone]  Name: Isaac Stevens MRN: [id number] Date of Birth: 10-Jan-1936

## 2019-08-07 NOTE — Therapy (Signed)
[place] MAIN [facility] SERVICES [address] [place], [place], [address] Phone: [phone]   Fax:  [phone]  Occupational Therapy Treatment  Patient Details  Name: Isaac Stevens MRN: [id number] Date of Birth: May 21, 1935 Referring Provider (OT): Dorise Hiss, [place]   Encounter Date: 08/07/2019   OT End of Session - 08/07/19 1643    Visit Number 28    Number of Visits 62    Date for OT Re-Evaluation 08/20/19    Authorization Type FOTO   Progress report period starting 02/20/2019    OT Start Time 1102    OT Stop Time 1145    OT Time Calculation (min) 43 min    Activity Tolerance Patient tolerated treatment well    Behavior During Therapy [facility] for tasks assessed/performed           Past Medical History:  Diagnosis Date  . CAFL (chronic airflow limitation) (HCC)   . CKD (chronic kidney disease)   . Diabetes mellitus without complication ([place])   . Hyperlipidemia   . Hypertension   . Rheumatoid arthritis [facility])     Past Surgical History:  Procedure Laterality Date  . TOE SURGERY     right foot    There were no vitals filed for this visit.   Subjective Assessment - 08/07/19 1107    Subjective  Pt. reports that his side in feeling a little better this morning.    Pertinent History Pt. is an 84 y.o. male who was initially admitted to [facility] for 24 hours following a CVA. Pt. was discharged, and one week later was admitted to [facility] with a CVA presenting with right sidedl weakness. Pt. received inpatient therapy services, and HHOT services upon discharge. Pt. is now ready for outpatient OT services.    Special Tests FOTO score: 43    Patient Stated Goals To regain the use of his right arm.    Currently in Pain? Yes    Pain Score 2     Pain Location Flank    Pain Orientation Right    Pain Descriptors / Indicators Aching           OT TREATMENT    Therapeutic Exercise:   Pt. performed scapular elevation, depression,  abduction/rotation, AROM, with AAROM for shoulder flexion, abduction, AROM for elbow flexion, extension, supination, and wrist extension.   Selfcare:  Pt. Performed prewriting exercises moving his digits up, and down a drumstick to distal movements needed during writing, and digit extension. Pt. worked on Media planner. Pt. Worked on writing lists of words. Pt. was able to formulate his signature both in printed, and cursive form with 75&% legibility. Pt. was unable to complete longer words. Pt. continues to working towards maintaining a tripod grasp on a wider width pen. Pt. Was able to formulate the letters with more isolated distal movement today, rather than writing with his  whole UE as one unit.   Manual Therapy:   Pt. Tolerated soft tissue mobilizations at the radius on ulna for supination, and for metacarpal spread stretches to prepare the hand for ROM, and functional use. Pt education was provided about retrograde massage for edema control. Manual techniques were performed independent of Therapeutic Ex.  Pt. Is improving with his right UE functioning, and continues to try to engage it more during ADL, and IADL tasks at home. Pt. Presented with improved edema in the right hand today. Pt. Is improving with writing his name legibly to be able to sign important documents,  and was able to create a list of words with improved legibility. Pt. continues to work on isolating distal digit movements when writing. Pt. ontinues to work on maintaining a mature pen grasp when writing, increasing distal mobility needed to move the pen across the paper when formulating letters. Pt. continues to benefit from OT services to improve writing legibility, improve edema, and improve RUE functioning during ADLs, and IADLs.                           OT Education - 08/07/19 1643    Education Details Digit extension    Person(s) Educated Patient    Methods Explanation;Demonstration     Comprehension Verbalized understanding;Returned demonstration               OT Long Term Goals - 05/29/19 1334      OT LONG TERM GOAL #1   Title Pt. will increase Right shoulder AROM  in order to be able to reach into a cabinet.    Baseline 20 degrees of isolated flexion. Pt. has difficulty reaching up into cabinets.    Time 12    Period Weeks    Status On-going    Target Date 08/20/19      OT LONG TERM GOAL #2   Title Pt. will increase right shoulder abduction to be able to independently wash his hair.    Baseline Pt. continues to present with limited abduction, and is unable to reach up to his head to wash his hair.    Time 12    Period Weeks    Status On-going    Target Date 08/20/19      OT LONG TERM GOAL #3   Title Pt. will perform self-feeding with his right hand with minA.    Baseline Pt. continues to require modA, and is able to assist with using a fork with the right hand  when eating an eggs.    Time 12    Period Weeks    Status On-going    Target Date 08/20/19      OT LONG TERM GOAL #4   Title Pt. will increase right wrist extension by 10 degrees in preparation for reaching for a glass.    Baseline Pt. has difficulty reaching for a glass.    Time 12    Period Weeks    Status On-going    Target Date 08/20/19      OT LONG TERM GOAL #5   Title Pt. will independently use his right hand to wash his face.    Baseline Pt. is now able to reach his face with his right hand. Pt.is unable to perfrome the washing motion.    Time 12    Period Weeks    Status On-going    Target Date 08/20/19      OT LONG TERM GOAL #6   Title Pt. will be independent with UE dressing    Baseline Pt. requires modA    Time 12    Period Weeks    Status On-going    Target Date 08/20/19      OT LONG TERM GOAL #7   Title Pt. will require minA LE dressing    Baseline Pt. continues to require minA donning socks. Pt. requires modA with pants, and shoes.    Time 12    Period Weeks     Status On-going    Target Date 08/20/19  Plan - 08/07/19 1644    Clinical Impression Statement Pt. Is improving with his right UE functioning, and continues to try to engage it more during ADL, and IADL tasks at home. Pt. Presented with improved edema in the right hand today. Pt. Is improving with writing his name legibly to be able to sign important documents, and was able to create a list of words with improved legibility. Pt. continues to work on isolating distal digit movements when writing. Pt. ontinues to work on maintaining a mature pen grasp when writing, increasing distal mobility needed to move the pen across the paper when formulating letters. Pt. continues to benefit from OT services to improve writing legibility, improve edema, and improve RUE functioning during ADLs, and IADLs.   Occupational performance deficits (Please refer to evaluation for details): ADL's;IADL's;Education    Body Structure / Function / Physical Skills ADL;FMC;IADL;UE functional use;Dexterity;Strength;ROM;Coordination    Rehab Potential Good    Clinical Decision Making Several treatment options, min-mod task modification necessary    Comorbidities Affecting Occupational Performance: May have comorbidities impacting occupational performance    Modification or Assistance to Complete Evaluation  Min-Moderate modification of tasks or assist with assess necessary to complete eval    OT Frequency 2x / week    OT Duration 12 weeks    OT Treatment/Interventions Self-care/ADL training;Patient/family education;Therapeutic activities;DME and/or AE instruction;Neuromuscular education;Therapeutic exercise    Consulted and Agree with Plan of Care Patient           Patient will benefit from skilled therapeutic intervention in order to improve the following deficits and impairments:   Body Structure / Function / Physical Skills: ADL, FMC, IADL, UE functional use, Dexterity, Strength, ROM,  Coordination       Visit Diagnosis: Muscle weakness (generalized)  Other lack of coordination    Problem List Patient Active Problem List   Diagnosis Date Noted  . Stroke ([place]) 11/19/2018  . Syncope 10/21/2015  . Drug-induced pneumonitis 04/02/2015  . COPD (chronic obstructive pulmonary disease) ([place]) 04/02/2015  . Dyspnea 04/02/2015  . COPD exacerbation ([place]) 03/11/2015  . CAFL (chronic airflow limitation) ([place]) 08/14/2014  . HLD (hyperlipidemia) 08/14/2014  . BP (high blood pressure) 08/14/2014  . Diabetes mellitus type 2, uncontrolled ([place]) 08/14/2014  . Chronic kidney disease (CKD), stage III (moderate) 01/27/2014  . 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

## 2019-08-13 ENCOUNTER — Ambulatory Visit: Payer: PPO | Admitting: Occupational Therapy

## 2019-08-13 ENCOUNTER — Other Ambulatory Visit: Payer: Self-pay

## 2019-08-13 ENCOUNTER — Encounter: Payer: Self-pay | Admitting: Occupational Therapy

## 2019-08-13 ENCOUNTER — Ambulatory Visit: Payer: PPO

## 2019-08-13 DIAGNOSIS — M6281 Muscle weakness (generalized): Secondary | ICD-10-CM | POA: Diagnosis not present

## 2019-08-13 DIAGNOSIS — R278 Other lack of coordination: Secondary | ICD-10-CM

## 2019-08-13 DIAGNOSIS — R2681 Unsteadiness on feet: Secondary | ICD-10-CM

## 2019-08-13 NOTE — Therapy (Signed)
[place] MAIN [facility] SERVICES [address] [place], [place], [address] Phone: [phone]   Fax:  [phone]  Occupational Therapy Treatment  Patient Details  Name: Isaac Stevens MRN: [id number] Date of Birth: [birth year] Referring Provider (OT): Dorise Hiss, [place]   Encounter Date: 08/13/2019   OT End of Session - 08/13/19 1107    Visit Number 29    Number of Visits 89    Date for OT Re-Evaluation 08/20/19    Authorization Type FOTO   Progress report period starting 02/20/2019    OT Start Time 1100    OT Stop Time 1145    OT Time Calculation (min) 45 min    Activity Tolerance Patient tolerated treatment well    Behavior During Therapy [facility] for tasks assessed/performed           Past Medical History:  Diagnosis Date  . CAFL (chronic airflow limitation) (HCC)   . CKD (chronic kidney disease)   . Diabetes mellitus without complication ([place])   . Hyperlipidemia   . Hypertension   . Rheumatoid arthritis [facility])     Past Surgical History:  Procedure Laterality Date  . TOE SURGERY     right foot    There were no vitals filed for this visit.   Subjective Assessment - 08/13/19 1106    Subjective  Pt. reports his right hand has more swelling today.    Patient is accompanied by: Family member    Currently in Pain? No/denies           OT TREATMENT    Therapeutic Exercise:   Pt. performed scapular elevation, depression, abduction/rotation, AROM, with AAROM for shoulder flexion, abduction, AROM for elbow flexion, extension, supination, and wrist extension, and digit flexion and extension.   Manual Therapy:  Pt. tolerated retrograde massage to the right hand for edema control. Pt. edema circumferential measurements  Prior to manual therapy: wrist: 20cm, metacarpals 24.5cm. After manual techniques it improved by 1 cm each to wrist: 19 cm, Metacarpals: 23 cm. Pt. Tolerated soft tissue mobilizations for metacarpal spread stretches.  Pt education was provided about retrograde massage for edema control. Manual techniques were performed independent of Therapeutic Ex.  Pt. reports that his right sided flank pain has improved,a nd reports no pain. Pt. Continues to improve with his right UE functioning, and continues to try to engage it more during ADLs, and IADL tasks at home. Pt. has improved edema by 1 cm at his wrist, and metacarpals following edema manual control techniques. Pt. Continues to require cues for encouragement to perform the tasks at home.  Pt. continues to benefit from OT services to improve RUE functioning during ADLs, and IADL tasks.                        OT Education - 08/13/19 1107    Education Details Digit extension    Person(s) Educated Patient    Methods Explanation;Demonstration    Comprehension Verbalized understanding;Returned demonstration               OT Long Term Goals - 05/29/19 1334      OT LONG TERM GOAL #1   Title Pt. will increase Right shoulder AROM  in order to be able to reach into a cabinet.    Baseline 20 degrees of isolated flexion. Pt. has difficulty reaching up into cabinets.    Time 12    Period Weeks    Status On-going  Target Date 08/20/19      OT LONG TERM GOAL #2   Title Pt. will increase right shoulder abduction to be able to independently wash his hair.    Baseline Pt. continues to present with limited abduction, and is unable to reach up to his head to wash his hair.    Time 12    Period Weeks    Status On-going    Target Date 08/20/19      OT LONG TERM GOAL #3   Title Pt. will perform self-feeding with his right hand with minA.    Baseline Pt. continues to require modA, and is able to assist with using a fork with the right hand  when eating an eggs.    Time 12    Period Weeks    Status On-going    Target Date 08/20/19      OT LONG TERM GOAL #4   Title Pt. will increase right wrist extension by 10 degrees in preparation for  reaching for a glass.    Baseline Pt. has difficulty reaching for a glass.    Time 12    Period Weeks    Status On-going    Target Date 08/20/19      OT LONG TERM GOAL #5   Title Pt. will independently use his right hand to wash his face.    Baseline Pt. is now able to reach his face with his right hand. Pt.is unable to perfrome the washing motion.    Time 12    Period Weeks    Status On-going    Target Date 08/20/19      OT LONG TERM GOAL #6   Title Pt. will be independent with UE dressing    Baseline Pt. requires modA    Time 12    Period Weeks    Status On-going    Target Date 08/20/19      OT LONG TERM GOAL #7   Title Pt. will require minA LE dressing    Baseline Pt. continues to require minA donning socks. Pt. requires modA with pants, and shoes.    Time 12    Period Weeks    Status On-going    Target Date 08/20/19                 Plan - 08/13/19 1108    Clinical Impression Statement Pt. reports that his right sided flank pain has improved,a nd reports no pain. Pt. Continues to improve with his right UE functioning, and continues to try to engage it more during ADLs, and IADL tasks at home. Pt. has improved edema by 1 cm at his wrist, and metacarpals following edema manual control techniques. Pt. Continues to require cues for encouragement to perform the tasks at home. Pt. continues to benefit from OT services to improve RUE functioning during ADLs, and IADL tasks.   Occupational performance deficits (Please refer to evaluation for details): ADL's;IADL's;Education    Body Structure / Function / Physical Skills ADL;FMC;IADL;UE functional use;Dexterity;Strength;ROM;Coordination    Rehab Potential Good    Clinical Decision Making Several treatment options, min-mod task modification necessary    Comorbidities Affecting Occupational Performance: May have comorbidities impacting occupational performance    Modification or Assistance to Complete Evaluation  Min-Moderate  modification of tasks or assist with assess necessary to complete eval    OT Frequency 2x / week    OT Duration 12 weeks    OT Treatment/Interventions Self-care/ADL training;Patient/family education;Therapeutic activities;DME and/or AE instruction;Neuromuscular education;Therapeutic  exercise    Consulted and Agree with Plan of Care Patient           Patient will benefit from skilled therapeutic intervention in order to improve the following deficits and impairments:   Body Structure / Function / Physical Skills: ADL, FMC, IADL, UE functional use, Dexterity, Strength, ROM, Coordination       Visit Diagnosis: Muscle weakness (generalized)  Other lack of coordination    Problem List Patient Active Problem List   Diagnosis Date Noted  . Stroke ([place]) 11/19/2018  . Syncope 10/21/2015  . Drug-induced pneumonitis 04/02/2015  . COPD (chronic obstructive pulmonary disease) ([place]) 04/02/2015  . Dyspnea 04/02/2015  . COPD exacerbation ([place]) 03/11/2015  . CAFL (chronic airflow limitation) ([place]) 08/14/2014  . HLD (hyperlipidemia) 08/14/2014  . BP (high blood pressure) 08/14/2014  . Diabetes mellitus type 2, uncontrolled ([place]) 08/14/2014  . Chronic kidney disease (CKD), stage III (moderate) 01/27/2014  . 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 [place], [place], [address] Phone: [phone]   Fax:  [phone]  Name: Isaac Stevens MRN: [id number] Date of Birth: 07-04-1935

## 2019-08-13 NOTE — Therapy (Signed)
[place] MAIN [facility] SERVICES [address] [place], [place], [address] Phone: [phone]   Fax:  [phone]  Physical Therapy Treatment  Patient Details  Name: Isaac Stevens MRN: [id number] Date of Birth: March 16, 1935 Referring Provider (PT): Rachelle Hora, MD   Encounter Date: 08/13/2019   PT End of Session - 08/13/19 1028    Visit Number 23    Number of Visits 49    Date for PT Re-Evaluation 09/11/19    Authorization Type Pn 10/09/24, recertification 08/18/43    PT Start Time 1028    PT Stop Time 1100    PT Time Calculation (min) 32 min    Equipment Utilized During Treatment Gait belt    Activity Tolerance Patient tolerated treatment well;No increased pain;Patient limited by fatigue    Behavior During Therapy [facility] for tasks assessed/performed           Past Medical History:  Diagnosis Date  . CAFL (chronic airflow limitation) (HCC)   . CKD (chronic kidney disease)   . Diabetes mellitus without complication ([place])   . Hyperlipidemia   . Hypertension   . Rheumatoid arthritis [facility])     Past Surgical History:  Procedure Laterality Date  . TOE SURGERY     right foot    There were no vitals filed for this visit.   Subjective Assessment - 08/13/19 1027    Subjective Pt doing well this date. Denies any updates. Denies any stumbles or falls since last therapy session. Reports that his flank pain is improving and states that he is not having any pain upon arrival. No specific questions or concerns upon arrival.    Pertinent History Pt. is an 84y.o. male who was initially admitted to [facility] for 24 hours following a CVA on 11/19/18. Pt. was discharged, and one week later was admitted to [facility] with a CVA presenting with right sided weakness. Pt. received inpatient therapy services, and [facility] rehab services upon discharge. He is now being referred for outpatient rehab. Patient has already started outpatient OT to address RUE weakness and ADLs. 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 He has also been referred to orthopedics in January 2021 due to RUE frozen shoulder/weakness and pain. He presents to therapy with tripod base cane which he reports using intermittently to reduce fall risk. He denies any recent falls. He denies any numbness/tingling    Limitations Standing;Walking    How long can you sit comfortably? NA    How long can you stand comfortably? 5-10 min, with fatigue    How long can you walk comfortably? 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 No/denies    Pain Onset --               TREATMENT   Ther-ex Seated LAQ with 4# AW x 20 BLE; Seated clams with green tband resistance x 20 BLE; Seated adductor squeeze with ball between knees, 3s hold, x 20 BLE; Seated HS curls with green tband x 20 BLE;  Standing exercises with 4# AW: Marches x 20 BLE; Hip abduction x 20 BLE; Hip extension x 20 BLE;   Neuromuscular Re-education  6" alternating step-ups with LUE support alternating leading LE x 10 on each side; 4 square stepping x 2 laps clockwise and counterclockwise; 6"  alternating forward step taps with BUE support x 10 BLE;   Pt educated throughout session about proper posture and technique with exercises. Improved exercise technique, movement at target joints, use of target muscles after min to mod verbal, visual, tactile cues.   Pt was motivated and participated well within session. He arrived late so session abbreviated. No R flank pain upon arrival and does not increase during session. He was instructed in advanced LE strengthening today as well as balance exercises. Pt continues to demonstrate decreased RLE strength compared to L side.He does require min VCs for proper positioning/exercise technique for optimal strengthening.He requires  intermittent rest breaks provided due to fatigue. He would benefit from addtional skilled PT intervention to improve strength, balance and mobility.           PT Short Term Goals - 07/24/19 1132      PT SHORT TERM GOAL #1   Title Patient will be adherent to HEP at least 3x a week to improve functional strength and balance for better safety at home.    Baseline 05/12/19: "I do it some of the time." 07/22/19: Partially compliant    Time 4    Period Weeks    Status Partially Met    Target Date 08/14/19      PT SHORT TERM GOAL #2   Title Patient will increase BLE gross strength to 4/5 particularly in right hip as to improve functional strength for independent gait, increased standing tolerance and increased ADL ability    Baseline see visit note 05/12/19; 07/22/19: Deferred; 07/24/19: see note    Time 4    Period Weeks    Status Partially Met    Target Date 08/14/19      PT SHORT TERM GOAL #3   Title Patient will be mod I for all bed mobility including rolling and supine to sit without difficulty to increase independence at home.    Baseline 05/12/19: Able to roll to both directions and move from sidelying to sitting, more challenging rolling to the R side and pushing up with RUE    Time 4    Period Weeks    Status Achieved             PT Long Term Goals - 07/24/19 1132      PT LONG TERM GOAL #1   Title Patient (> 34 years old) will complete five times sit to stand test in < 15 seconds indicating an increased LE strength and improved balance.    Baseline 05/12/19: 16.3s; 07/22/19: 13.9s    Time 8    Period Weeks    Status Achieved      PT LONG TERM GOAL #2   Title Patient will demonstrate an improved Berg Balance Score of >48/56 as to demonstrate improved balance with ADLs such as sitting/standing and transfer balance and reduced fall risk.    Baseline 05/12/19: 47/56; 07/22/19: 45/56    Time 8    Period Weeks    Status Partially Met    Target Date 09/11/19      PT LONG TERM  GOAL #3   Title Patient will increase six minute walk test distance to >1000 for progression to community ambulator and improve gait ability    Baseline 05/12/19: 800'; 07/22/19: 700'    Time 8    Period Weeks    Status On-going    Target Date 09/11/19      PT LONG TERM GOAL #4   Title Patient will tolerate 5  seconds of single leg stance without loss of balance to improve ability to get in and out of shower safely.    Baseline 05/12/19: 1-2s; 07/22/19: unchanged (1-2s), more difficulty on RLE    Time 8    Period Weeks    Status On-going    Target Date 09/11/19                 Plan - 08/13/19 1028    Clinical Impression Statement Pt was motivated and participated well within session. He arrived late so session abbreviated. No R flank pain upon arrival and does not increase during session. He was instructed in advanced LE strengthening today as well as balance exercises. Pt continues to demonstrate decreased RLE strength compared to L side. He does require min VCs for proper positioning/exercise technique for optimal strengthening. He requires intermittent rest breaks provided due to fatigue. He would benefit from addtional skilled PT intervention to improve strength, balance and mobility.    Personal Factors and Comorbidities Comorbidity 3+;Age;Time since onset of injury/illness/exacerbation    Comorbidities recent stroke, DM x2 (on medication), HTN (somewhat controlled), CKD, HLD, RA (worse in right hand)    Examination-Activity Limitations Bed Mobility;Caring for Others;Carry;Dressing;Lift;Locomotion Level;Reach Overhead;Squat;Stairs;Stand;Transfers    Examination-Participation Restrictions Church;Cleaning;Community Activity;Driving;Laundry;Meal Prep;School;Shop;Volunteer;Yard Work    Stability/Clinical Decision Making Stable/Uncomplicated    Clinical Decision Making Low    Rehab Potential Good    PT Frequency 2x / week    PT Duration 8 weeks    PT Treatment/Interventions ADLs/Self  Care Home Management;Cryotherapy;Electrical Stimulation;Moist Heat;Gait training;Functional mobility Scientist, forensic;Therapeutic activities;Therapeutic exercise;Balance training;Neuromuscular re-education;Patient/family education;Orthotic Fit/Training;Energy conservation    PT Next Visit Plan Continue with progression in balance training, strength and conditioning    PT Home Exercise Plan Medbridge Access Code: [email or web address]    Consulted and Agree with Plan of Care Patient           Patient will benefit from skilled therapeutic intervention in order to improve the following deficits and impairments:  Abnormal gait, Decreased balance, Decreased endurance, Decreased mobility, Difficulty walking, Impaired perceived functional ability, Cardiopulmonary status limiting activity, Decreased activity tolerance, Decreased safety awareness, Decreased strength  Visit Diagnosis: Muscle weakness (generalized)  Other lack of coordination  Unsteadiness on feet     Problem List Patient Active Problem List   Diagnosis Date Noted  . Stroke ([place]) 11/19/2018  . Syncope 10/21/2015  . Drug-induced pneumonitis 04/02/2015  . COPD (chronic obstructive pulmonary disease) ([place]) 04/02/2015  . Dyspnea 04/02/2015  . COPD exacerbation ([place]) 03/11/2015  . CAFL (chronic airflow limitation) ([place]) 08/14/2014  . HLD (hyperlipidemia) 08/14/2014  . BP (high blood pressure) 08/14/2014  . Diabetes mellitus type 2, uncontrolled ([place]) 08/14/2014  . Chronic kidney disease (CKD), stage III (moderate) 01/27/2014  . 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

## 2019-08-13 NOTE — Patient Instructions (Signed)
Access Code: [email or web address] URL: [email or web address] Date: 08/13/2019 Prepared by: Roxana Hires  Exercises Sit to Stand without Arm Support - 2 x daily - 7 x weekly - 2 sets - 10 reps Standing March with Counter Support - 2 x daily - 7 x weekly - 2 sets - 10 reps - 2s hold Heel rises with counter support - 2 x daily - 7 x weekly - 2 sets - 10 reps - 2s hold Standing Hip Extension with Counter Support - 2 x daily - 7 x weekly - 2 sets - 10 reps - 2s hold Standing Hip Abduction with Counter Support - 2 x daily - 7 x weekly - 2 sets - 10 reps - 2s hold

## 2019-08-18 ENCOUNTER — Encounter: Payer: Self-pay | Admitting: Occupational Therapy

## 2019-08-18 ENCOUNTER — Ambulatory Visit: Payer: PPO

## 2019-08-18 ENCOUNTER — Other Ambulatory Visit: Payer: Self-pay

## 2019-08-18 ENCOUNTER — Ambulatory Visit: Payer: PPO | Admitting: Occupational Therapy

## 2019-08-18 DIAGNOSIS — M6281 Muscle weakness (generalized): Secondary | ICD-10-CM

## 2019-08-18 DIAGNOSIS — R278 Other lack of coordination: Secondary | ICD-10-CM

## 2019-08-18 DIAGNOSIS — R2681 Unsteadiness on feet: Secondary | ICD-10-CM

## 2019-08-18 NOTE — Therapy (Signed)
[place] MAIN [facility] SERVICES [address] [place], [place], [address] Phone: [phone]   Fax:  [phone]  Occupational Therapy Treatment/Recertification Note Occupational Therapy Progress Note  Dates of reporting period  02/20/2019   to   08/18/2019   Patient Details  Name: Isaac Stevens MRN: [id number] Date of Birth: 20-Sep-1935 Referring Provider (OT): Dorise Hiss, [place]   Encounter Date: 08/18/2019   OT End of Session - 08/18/19 1106    Visit Number 30    Number of Visits 20    Date for OT Re-Evaluation 11/10/19    Authorization Type FOTO   Progress report period starting 02/20/2019    OT Start Time 1103    OT Stop Time 1145    OT Time Calculation (min) 42 min    Activity Tolerance Patient tolerated treatment well    Behavior During Therapy [facility] for tasks assessed/performed           Past Medical History:  Diagnosis Date  . CAFL (chronic airflow limitation) (HCC)   . CKD (chronic kidney disease)   . Diabetes mellitus without complication ([place])   . Hyperlipidemia   . Hypertension   . Rheumatoid arthritis [facility])     Past Surgical History:  Procedure Laterality Date  . TOE SURGERY     right foot    There were no vitals filed for this visit.   Subjective Assessment - 08/18/19 1105    Subjective  Pt. reports that he doing well today    Patient is accompanied by: Family member    Pertinent History Pt. is an 84 y.o. male who was initially admitted to [facility] for 24 hours following a CVA. Pt. was discharged, and one week later was admitted to [facility] with a CVA presenting with right sidedl weakness. Pt. received inpatient therapy services, and HHOT services upon discharge. Pt. is now ready for outpatient OT services.    Currently in Pain? No/denies              [facility] OT Assessment - 08/18/19 1113      Coordination   Right 9 Hole Peg Test 3 min & 5 min.      Hand Function   Right Hand Grip (lbs) 14    Right Hand Lateral  Pinch 7 lbs    Right Hand 3 Point Pinch 7 lbs          Pt. has made progress with OT services. FOTO score has improved to 50. Pt. Is independent with donning socks, underwear, and pants. Pt. Is able to hold his pants with the right hand, while fastening the zipper. Pt. Is engaging his right hand more during self-feeding, and is able to use his right to help with his sheets. Pt. Continues to work on improving right hand edema , RUE strength, grip strength, pinch strength, and [place] skills in order to be able to handle utensils to feed himself, perform self-grooming tasks, and engage his RUE during IADL tasks. Measurements were obtained, and  goals were reviewed with the pt.                   OT Education - 08/18/19 1106    Education Details Digit extension    Person(s) Educated Patient    Methods Explanation;Demonstration    Comprehension Verbalized understanding;Returned demonstration               OT Long Term Goals - 08/18/19 1131      OT LONG  TERM GOAL #1   Title Pt. will increase Right shoulder AROM  in order to be able to reach into a cabinet.    Baseline 20 degrees of isolated flexion. Pt. has difficulty reaching up into cabinets.    Time 12    Period Weeks    Status On-going    Target Date 11/10/19      OT LONG TERM GOAL #2   Title Pt. will increase right shoulder abduction to be able to independently wash his hair.    Baseline Pt. continues to present with limited abduction, and is unable to reach up to his head to wash his hair.    Time 12    Period Weeks    Status On-going    Target Date 11/10/19      OT LONG TERM GOAL #3   Title Pt. will perform self-feeding with his right hand with minA.    Baseline Pt. continues to require modA, and is able to assist with using a fork with the right hand  when eating an eggs.    Time 12    Period Weeks    Status On-going    Target Date 10/11/19      OT LONG TERM GOAL #4   Title Pt. will increase right wrist  extension by 10 degrees in preparation for reaching for a glass.    Baseline Pt. is improving with holding a glass, however is not able to bring it to his mouth    Time 12    Period Weeks    Target Date 10/11/19      OT LONG TERM GOAL #5   Title Pt. will independently use his right hand to wash his face.    Baseline Pt. is now able to reach his face with his right hand. Pt.is unable to thoroughy  washing motion.    Time 12    Period Weeks    Status On-going    Target Date 10/11/19      Long Term Additional Goals   Additional Long Term Goals Yes      OT LONG TERM GOAL #6   Title Pt. will be independent with UE dressing    Baseline Pt. is independent with increased time. Assist with buttons only.    Time 12    Period Weeks    Status On-going    Target Date 11/10/19      OT LONG TERM GOAL #7   Title Pt. will require minA LE dressing    Baseline Pt is independent donning socks. Pt. requires modA with pants, and shoes with increased time    Time 12    Period Weeks    Status On-going    Target Date 11/10/19      OT LONG TERM GOAL #8   Title Pt. will improve FOT score by 2 points for improvied UE functioning    Baseline 08/18/2019: Current FOTO score:50    Time 12    Period Weeks    Status New    Target Date 11/10/19                 Plan - 08/18/19 1107    Clinical Impression Statement Pt. has made progress with OT services. FOTO score has improved to 50. Pt. Is independent with donning socks, underwear, and pants. Pt. Is able to hold his pants with the right hand, while fastening the zipper. Pt. Is engaging his right hand more during self-feeding, and is able to use his  right to help with his sheets. Pt. Continues to work on improving right hand edema , RUE strength, grip strength, pinch strength, and [place] skills in order to be able to handle utensils to feed himself, perform self-grooming tasks, and engage his RUE during IADL tasks. Measurements were obtained, and  goals  were reviewed with the pt.    Occupational performance deficits (Please refer to evaluation for details): ADL's;IADL's;Education    Body Structure / Function / Physical Skills ADL;FMC;IADL;UE functional use;Dexterity;Strength;ROM;Coordination    Rehab Potential Good    Clinical Decision Making Several treatment options, min-mod task modification necessary    Comorbidities Affecting Occupational Performance: May have comorbidities impacting occupational performance    Modification or Assistance to Complete Evaluation  Min-Moderate modification of tasks or assist with assess necessary to complete eval    OT Frequency 2x / week    OT Duration 12 weeks    OT Treatment/Interventions Self-care/ADL training;Patient/family education;Therapeutic activities;DME and/or AE instruction;Neuromuscular education;Therapeutic exercise           Patient will benefit from skilled therapeutic intervention in order to improve the following deficits and impairments:   Body Structure / Function / Physical Skills: ADL, FMC, IADL, UE functional use, Dexterity, Strength, ROM, Coordination       Visit Diagnosis: Muscle weakness (generalized)  Other lack of coordination    Problem List Patient Active Problem List   Diagnosis Date Noted  . Stroke ([place]) 11/19/2018  . Syncope 10/21/2015  . Drug-induced pneumonitis 04/02/2015  . COPD (chronic obstructive pulmonary disease) ([place]) 04/02/2015  . Dyspnea 04/02/2015  . COPD exacerbation ([place]) 03/11/2015  . CAFL (chronic airflow limitation) ([place]) 08/14/2014  . HLD (hyperlipidemia) 08/14/2014  . BP (high blood pressure) 08/14/2014  . Diabetes mellitus type 2, uncontrolled ([place]) 08/14/2014  . Chronic kidney disease (CKD), stage III (moderate) 01/27/2014  . 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 SERVICES [address] [place], [place],  [address] Phone: [phone]   Fax:  [phone]  Name: HERSEL MCMEEN MRN: [id number] Date of Birth: 16-Sep-1935

## 2019-08-18 NOTE — Therapy (Addendum)
[place] MAIN [facility] SERVICES [address] [place], [place], [address] Phone: [phone]   Fax:  [phone]  Physical Therapy Treatment  Patient Details  Name: Isaac Stevens MRN: [id number] Date of Birth: [birth year] Referring Provider (PT): Rachelle Hora, MD   Encounter Date: 08/18/2019   PT End of Session - 08/18/19 1029    Visit Number 24    Number of Visits 49    Date for PT Re-Evaluation 09/11/19    Authorization Type Pn 08/14/91, recertification 09/16/15    PT Start Time 1025    PT Stop Time 1100    PT Time Calculation (min) 35 min    Equipment Utilized During Treatment Gait belt    Activity Tolerance Patient tolerated treatment well;No increased pain;Patient limited by fatigue    Behavior During Therapy [facility] for tasks assessed/performed           Past Medical History:  Diagnosis Date  . CAFL (chronic airflow limitation) (HCC)   . CKD (chronic kidney disease)   . Diabetes mellitus without complication ([place])   . Hyperlipidemia   . Hypertension   . Rheumatoid arthritis [facility] [place])     Past Surgical History:  Procedure Laterality Date  . TOE SURGERY     right foot    There were no vitals filed for this visit.   Subjective Assessment - 08/18/19 1152    Subjective Pt doing well this date. Denies any updates. Denies any stumbles or falls since last therapy session. He denies any pain in R flank. He reports doing his HEP regularly and consistently.    Pertinent History Pt. is an 84y.o. male who was initially admitted to [facility] for 24 hours following a CVA on 11/19/18. Pt. was discharged, and one week later was admitted to [facility] with a CVA presenting with right sided weakness. Pt. received inpatient therapy services, and [facility] rehab services upon discharge. He is now being referred for outpatient rehab. Patient has already started outpatient OT to address RUE weakness and ADLs. 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 He has  also been referred to orthopedics in January 2021 due to RUE frozen shoulder/weakness and pain. He presents to therapy with tripod base cane which he reports using intermittently to reduce fall risk. He denies any recent falls. He denies any numbness/tingling    Limitations Standing;Walking    How long can you sit comfortably? NA    How long can you stand comfortably? 5-10 min, with fatigue    How long can you walk comfortably? 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 No/denies            TREATMENT  Ther-ex Nustep level 3 x 5 minutes during history taking (4 minutes unbilled) Seated LAQ with 4# AW x 20 BLE; Seated clams with green tband resistance x 20 BLE; Seated adductor squeeze with ball between knees, 3s hold, x 20 BLE; Seated HS curls with 4# AW x 20 BLE;  Standing exercises with 4# AW: Marches x 20 BLE; Hip abduction x 20 BLE Hip extension x 20 BLE   Neuromuscular Re-education  6" alternating step-ups with LUE support alternating leading LE x 20 on each side; Sit to stands from normal chair with no UE support x 15 Sit to stands with foam under  feet with minimal L UE support x5 Orange hurdle forward step-over x 20 Balancing on foam surface FT and Semitandem x 30s each   Pt educated throughout session about proper posture and technique with exercises. Improved exercise technique, movement at target joints, use of target muscles after min to mod verbal, visual, tactile cues.   Pt was motivated and participated well within session. He arrived late so session abbreviated. No R flank pain upon arrival and does not increase during session. Patient increased reps with steps ups, showing increased strength and endurance. Pt continues to demonstrate decreased RLE strength compared to L side.He does require min VCs for proper  positioning/exercise technique for optimal strengthening.He still requires intermittent rest breaks provided due to fatigue, but this took less breaks this session indicating increased endurance. He would benefit from addtional skilled PT intervention to improve strength, balance and mobility.        PT Short Term Goals - 07/24/19 1132      PT SHORT TERM GOAL #1   Title Patient will be adherent to HEP at least 3x a week to improve functional strength and balance for better safety at home.    Baseline 05/12/19: "I do it some of the time." 07/22/19: Partially compliant    Time 4    Period Weeks    Status Partially Met    Target Date 08/14/19      PT SHORT TERM GOAL #2   Title Patient will increase BLE gross strength to 4/5 particularly in right hip as to improve functional strength for independent gait, increased standing tolerance and increased ADL ability    Baseline see visit note 05/12/19; 07/22/19: Deferred; 07/24/19: see note    Time 4    Period Weeks    Status Partially Met    Target Date 08/14/19      PT SHORT TERM GOAL #3   Title Patient will be mod I for all bed mobility including rolling and supine to sit without difficulty to increase independence at home.    Baseline 05/12/19: Able to roll to both directions and move from sidelying to sitting, more challenging rolling to the R side and pushing up with RUE    Time 4    Period Weeks    Status Achieved             PT Long Term Goals - 07/24/19 1132      PT LONG TERM GOAL #1   Title Patient (> 36 years old) will complete five times sit to stand test in < 15 seconds indicating an increased LE strength and improved balance.    Baseline 05/12/19: 16.3s; 07/22/19: 13.9s    Time 8    Period Weeks    Status Achieved      PT LONG TERM GOAL #2   Title Patient will demonstrate an improved Berg Balance Score of >48/56 as to demonstrate improved balance with ADLs such as sitting/standing and transfer balance and reduced fall risk.     Baseline 05/12/19: 47/56; 07/22/19: 45/56    Time 8    Period Weeks    Status Partially Met    Target Date 09/11/19      PT LONG TERM GOAL #3   Title Patient will increase six minute walk test distance to >1000 for progression to community ambulator and improve gait ability    Baseline 05/12/19: 800'; 07/22/19: 700'    Time 8    Period Weeks    Status On-going    Target  Date 09/11/19      PT LONG TERM GOAL #4   Title Patient will tolerate 5 seconds of single leg stance without loss of balance to improve ability to get in and out of shower safely.    Baseline 05/12/19: 1-2s; 07/22/19: unchanged (1-2s), more difficulty on RLE    Time 8    Period Weeks    Status On-going    Target Date 09/11/19                 Plan - 08/18/19 1043    Clinical Impression Statement Pt was motivated and participated well within session. He arrived late so session abbreviated. No R flank pain upon arrival and does not increase during session. Patient increased reps with steps ups, showing increased strength and endurance. Pt continues to demonstrate decreased RLE strength compared to L side. He does require min VCs for proper positioning/exercise technique for optimal strengthening. He still requires intermittent rest breaks provided due to fatigue, but this took less breaks this session indicating increased endurance. He would benefit from addtional skilled PT intervention to improve strength, balance and mobility.    Personal Factors and Comorbidities Comorbidity 3+;Age;Time since onset of injury/illness/exacerbation    Comorbidities recent stroke, DM x2 (on medication), HTN (somewhat controlled), CKD, HLD, RA (worse in right hand)    Examination-Activity Limitations Bed Mobility;Caring for Others;Carry;Dressing;Lift;Locomotion Level;Reach Overhead;Squat;Stairs;Stand;Transfers    Examination-Participation Restrictions Church;Cleaning;Community Activity;Driving;Laundry;Meal Prep;School;Shop;Volunteer;Yard  Work    Stability/Clinical Decision Making Stable/Uncomplicated    Rehab Potential Good    PT Frequency 2x / week    PT Duration 8 weeks    PT Treatment/Interventions ADLs/Self Care Home Management;Cryotherapy;Electrical Stimulation;Moist Heat;Gait training;Functional mobility Scientist, forensic;Therapeutic activities;Therapeutic exercise;Balance training;Neuromuscular re-education;Patient/family education;Orthotic Fit/Training;Energy conservation    PT Next Visit Plan Continue with progression in balance training, strength and conditioning    PT Home Exercise Plan Medbridge Access Code: [email or web address]    Consulted and Agree with Plan of Care Patient           Patient will benefit from skilled therapeutic intervention in order to improve the following deficits and impairments:  Abnormal gait, Decreased balance, Decreased endurance, Decreased mobility, Difficulty walking, Impaired perceived functional ability, Cardiopulmonary status limiting activity, Decreased activity tolerance, Decreased safety awareness, Decreased strength  Visit Diagnosis: Muscle weakness (generalized)  Other lack of coordination  Unsteadiness on feet     Problem List Patient Active Problem List   Diagnosis Date Noted  . Stroke ([place]) 11/19/2018  . Syncope 10/21/2015  . Drug-induced pneumonitis 04/02/2015  . COPD (chronic obstructive pulmonary disease) ([place]) 04/02/2015  . Dyspnea 04/02/2015  . COPD exacerbation ([place]) 03/11/2015  . CAFL (chronic airflow limitation) ([place]) 08/14/2014  . HLD (hyperlipidemia) 08/14/2014  . BP (high blood pressure) 08/14/2014  . Diabetes mellitus type 2, uncontrolled ([place]) 08/14/2014  . Chronic kidney disease (CKD), stage III (moderate) 01/27/2014  . 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 I have personally read, edited and approve of the note as  written.   Noemi Chapel, SPT Phillips Grout PT, DPT, GCS  Huprich,Jason 08/18/2019, 11:56 AM  [place] MAIN [facility] SERVICES [address] [place], [place], [address] Phone: [phone]   Fax:  [phone]  Name: Isaac Stevens MRN: [id number] Date of Birth: 1935-03-29

## 2019-08-19 DIAGNOSIS — M1A00X0 Idiopathic chronic gout, unspecified site, without tophus (tophi): Secondary | ICD-10-CM | POA: Diagnosis not present

## 2019-08-19 DIAGNOSIS — N1831 Chronic kidney disease, stage 3a: Secondary | ICD-10-CM | POA: Diagnosis not present

## 2019-08-20 ENCOUNTER — Encounter: Payer: Self-pay | Admitting: Occupational Therapy

## 2019-08-20 ENCOUNTER — Ambulatory Visit: Payer: PPO

## 2019-08-20 ENCOUNTER — Ambulatory Visit: Payer: PPO | Admitting: Occupational Therapy

## 2019-08-20 ENCOUNTER — Other Ambulatory Visit: Payer: Self-pay

## 2019-08-20 DIAGNOSIS — M6281 Muscle weakness (generalized): Secondary | ICD-10-CM

## 2019-08-20 DIAGNOSIS — R2681 Unsteadiness on feet: Secondary | ICD-10-CM

## 2019-08-20 DIAGNOSIS — R278 Other lack of coordination: Secondary | ICD-10-CM

## 2019-08-20 NOTE — Therapy (Signed)
[place] MAIN [facility] SERVICES [address] [place], [place], [address] Phone: [phone]   Fax:  [phone]  Occupational Therapy Treatment  Patient Details  Name: Isaac Stevens MRN: [id number] Date of Birth: 11-29-1935 Referring Provider (OT): Dorise Hiss, [place]   Encounter Date: 08/20/2019   OT End of Session - 08/20/19 1111    Visit Number 31    Number of Visits 8    Date for OT Re-Evaluation 11/10/19    Authorization Type FOTO   Progress report period starting 02/20/2019    OT Start Time 1107    OT Stop Time 1145    OT Time Calculation (min) 38 min    Activity Tolerance Patient tolerated treatment well    Behavior During Therapy [facility] for tasks assessed/performed           Past Medical History:  Diagnosis Date  . CAFL (chronic airflow limitation) (HCC)   . CKD (chronic kidney disease)   . Diabetes mellitus without complication ([place])   . Hyperlipidemia   . Hypertension   . Rheumatoid arthritis [facility])     Past Surgical History:  Procedure Laterality Date  . TOE SURGERY     right foot    There were no vitals filed for this visit.   Subjective Assessment - 08/20/19 1110    Subjective  Pt. reports feeling good today.    Patient is accompanied by: Family member    Pertinent History Pt. is an 84 y.o. male who was initially admitted to [facility] for 24 hours following a CVA. Pt. was discharged, and one week later was admitted to [facility] with a CVA presenting with right sidedl weakness. Pt. received inpatient therapy services, and HHOT services upon discharge. Pt. is now ready for outpatient OT services.    Special Tests FOTO score: 43    Patient Stated Goals To regain the use of his right arm.    Currently in Pain? No/denies          OT TREATMENT    Selfcare:  Pt. worked on Media planner. Pt. was able to formulate lists of words in printed form with 75% legibility for shorter words. Pt. required cues to try longer  writing longer words with with 50% legibility. Pt. Continues to work on maintaining a mature tripod grasp on a wide width pen. Pt. Required cues for prewriting exercises to improve distal digit mobility, and pen grasp.  Pt. Continues to formulate the letters while using his whole UE as one unit.Pt. worked on pre writing exercises with the pen in his fingers.  Manual Therapy:  Pt. tolerated retrograde massage to the right hand for edema control. Pt. edema circumferential measurements  Prior to manual therapy: wrist: 29cm, metacarpals 25cm. After manual techniques it improve to wrist: 19 cm, Metacarpals: 23 cm. Pt. Tolerated soft tissue mobilizations for metacarpal spread stretches. Pt education was provided about retrograde massage for edema control. Manual techniques were performed independent of Therapeutic Ex.  Pt. reports that his right sided flank pain has improved, and that he has not had any pain from it recently. Pt. has a follow up appointment this week. Pt. Is improving with his right UE functioning, and continues to try to engage it more during ADLs, and IADL tasks at home. Pt. has improved edema by 2 cm at the metacarpals following edema manual control techniques. Pt. Is improving with writing legibly in order to be able to complete important documents. Pt. Is able to write shorter words  with improved legibility vs. Longer words. Pt. Continues to work on maintaining a mature pen grasp when writing, increasing distal mobility needed to move the pen across the paper when formulating letters. Pt. continues to benefit from OT services to improve writing legibility, improve edema, and improve RUE functioning during ADLs, and IADLs.                         OT Education - 08/20/19 1110    Education Details Digit extension    Person(s) Educated Patient    Methods Explanation;Demonstration    Comprehension Verbalized understanding;Returned demonstration                OT Long Term Goals - 08/18/19 1131      OT LONG TERM GOAL #1   Title Pt. will increase Right shoulder AROM  in order to be able to reach into a cabinet.    Baseline 20 degrees of isolated flexion. Pt. has difficulty reaching up into cabinets.    Time 12    Period Weeks    Status On-going    Target Date 11/10/19      OT LONG TERM GOAL #2   Title Pt. will increase right shoulder abduction to be able to independently wash his hair.    Baseline Pt. continues to present with limited abduction, and is unable to reach up to his head to wash his hair.    Time 12    Period Weeks    Status On-going    Target Date 11/10/19      OT LONG TERM GOAL #3   Title Pt. will perform self-feeding with his right hand with minA.    Baseline Pt. continues to require modA, and is able to assist with using a fork with the right hand  when eating an eggs.    Time 12    Period Weeks    Status On-going    Target Date 10/11/19      OT LONG TERM GOAL #4   Title Pt. will increase right wrist extension by 10 degrees in preparation for reaching for a glass.    Baseline Pt. is improving with holding a glass, however is not able to bring it to his mouth    Time 12    Period Weeks    Target Date 10/11/19      OT LONG TERM GOAL #5   Title Pt. will independently use his right hand to wash his face.    Baseline Pt. is now able to reach his face with his right hand. Pt.is unable to thoroughy  washing motion.    Time 12    Period Weeks    Status On-going    Target Date 10/11/19      Long Term Additional Goals   Additional Long Term Goals Yes      OT LONG TERM GOAL #6   Title Pt. will be independent with UE dressing    Baseline Pt. is independent with increased time. Assist with buttons only.    Time 12    Period Weeks    Status On-going    Target Date 11/10/19      OT LONG TERM GOAL #7   Title Pt. will require minA LE dressing    Baseline Pt is independent donning socks. Pt. requires modA with pants, and  shoes with increased time    Time 12    Period Weeks    Status On-going  Target Date 11/10/19      OT LONG TERM GOAL #8   Title Pt. will improve FOT score by 2 points for improvied UE functioning    Baseline 08/18/2019: Current FOTO score:50    Time 12    Period Weeks    Status New    Target Date 11/10/19                 Plan - 08/20/19 1111    Clinical Impression Statement Pt. reports that his right sided flank pain has improved, and that he has not had any pain from it recently. Pt. has a follow up appointment this week. Pt. Is improving with his right UE functioning, and continues to try to engage it more during ADLs, and IADL tasks at home. Pt. has improved edema by 2 cm at the metacarpals following edema manual control techniques. Pt. Is improving with writing legibly in order to be able to complete important documents. Pt. Is able to write shorter words with improved legibility vs. Longer words. Pt. Continues to work on maintaining a mature pen grasp when writing, increasing distal mobility needed to move the pen across the paper when formulating letters. Pt. continues to benefit from OT services to improve writing legibility, improve edema, and improve RUE functioning during ADLs, and IADLs.   Occupational performance deficits (Please refer to evaluation for details): ADL's;IADL's;Education    Body Structure / Function / Physical Skills ADL;FMC;IADL;UE functional use;Dexterity;Strength;ROM;Coordination    Rehab Potential Good    Clinical Decision Making Several treatment options, min-mod task modification necessary    Comorbidities Affecting Occupational Performance: May have comorbidities impacting occupational performance    Modification or Assistance to Complete Evaluation  Min-Moderate modification of tasks or assist with assess necessary to complete eval    OT Frequency 2x / week    OT Duration 12 weeks    OT Treatment/Interventions Self-care/ADL training;Patient/family  education;Therapeutic activities;DME and/or AE instruction;Neuromuscular education;Therapeutic exercise    Consulted and Agree with Plan of Care Patient    Family Member Consulted wife           Patient will benefit from skilled therapeutic intervention in order to improve the following deficits and impairments:   Body Structure / Function / Physical Skills: ADL, FMC, IADL, UE functional use, Dexterity, Strength, ROM, Coordination       Visit Diagnosis: Muscle weakness (generalized)  Other lack of coordination    Problem List Patient Active Problem List   Diagnosis Date Noted  . Stroke ([place]) 11/19/2018  . Syncope 10/21/2015  . Drug-induced pneumonitis 04/02/2015  . COPD (chronic obstructive pulmonary disease) ([place]) 04/02/2015  . Dyspnea 04/02/2015  . COPD exacerbation ([place]) 03/11/2015  . CAFL (chronic airflow limitation) ([place]) 08/14/2014  . HLD (hyperlipidemia) 08/14/2014  . BP (high blood pressure) 08/14/2014  . Diabetes mellitus type 2, uncontrolled ([place]) 08/14/2014  . Chronic kidney disease (CKD), stage III (moderate) 01/27/2014  . 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 [place], [place], [address] Phone: [phone]   Fax:  [phone]  Name: ASEEL UHDE MRN: [id number] Date of Birth: 08-10-1935

## 2019-08-20 NOTE — Therapy (Signed)
[place] MAIN [facility] SERVICES [address] [place], [place], [address] Phone: [phone]   Fax:  [phone]  Physical Therapy Treatment  Patient Details  Name: Isaac Stevens MRN: [id number] Date of Birth: [birth year] Referring Provider (PT): Rachelle Hora, MD   Encounter Date: 08/20/2019   PT End of Session - 08/20/19 1025    Visit Number 25    Number of Visits 49    Date for PT Re-Evaluation 09/11/19    Authorization Type Pn 09/10/14, recertification 07/12/28    PT Start Time 1020    PT Stop Time 1100    PT Time Calculation (min) 40 min    Equipment Utilized During Treatment Gait belt    Activity Tolerance Patient tolerated treatment well;No increased pain;Patient limited by fatigue    Behavior During Therapy [facility] for tasks assessed/performed           Past Medical History:  Diagnosis Date   CAFL (chronic airflow limitation) (HCC)    CKD (chronic kidney disease)    Diabetes mellitus without complication ([place])    Hyperlipidemia    Hypertension    Rheumatoid arthritis ([place])     Past Surgical History:  Procedure Laterality Date   TOE SURGERY     right foot    There were no vitals filed for this visit.   Subjective Assessment - 08/20/19 1022    Subjective Pt doing okay today. He reports feeling tired and weak this morning. He did his exercises yesterday which he thinks contribute to his weakness.  Denies any stumbles or falls since last therapy session. He denies any pain in R flank area. No questions or concerns at this time.    Pertinent History Pt. is an 84y.o. male who was initially admitted to [facility] for 24 hours following a CVA on 11/19/18. Pt. was discharged, and one week later was admitted to [facility] with a CVA presenting with right sided weakness. Pt. received inpatient therapy services, and [facility] rehab services upon discharge. He is now being referred for outpatient rehab. Patient has already started outpatient OT to  address RUE weakness and ADLs. 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 He has also been referred to orthopedics in January 2021 due to RUE frozen shoulder/weakness and pain. He presents to therapy with tripod base cane which he reports using intermittently to reduce fall risk. He denies any recent falls. He denies any numbness/tingling    Limitations Standing;Walking    How long can you sit comfortably? NA    How long can you stand comfortably? 5-10 min, with fatigue    How long can you walk comfortably? 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 No/denies              TREATMENT  Ther-ex  Nustep level 3 x 4 minutes during history taking (2 minutes unbilled) Seated clams with green tband resistance x 20 BLE; Seated adductor squeeze with ball between knees, 3s hold, x 20 BLE;   Standing exercises with 4# AW: L UE support needed Hip flexion marches x 20 BLE; Hip abduction x 20 BLE Hip extension x 20 BLE    Neuromuscular Re-education  6" alternating step-ups with LUE support alternating leading LE x 20 on each side; Sit to stands from normal chair with  no UE support x10, hands placed on knees during exercise; Sit to stands with foam under feet with minimal L UE support x8; Balancing on foam surface FT and Semitandem x 30s each, cued to correct posture, no UE support needed; Balancing on foam surface with steps taps onto 6 step, L 2 finger UE support needed; 4 square stepping x 2 laps clockwise and counterclockwise, verbal cues needed for correct stepping and utilized UE support to catch himself.   Pt educated throughout session about proper posture and technique with exercises. Improved exercise technique, movement at target joints, use of target muscles after min to mod verbal, visual, tactile cues.    Pt was  motivated and participated well within session. No R flank pain upon arrival and does not increase during session. Patient reports being tired and feeling weak, so rests breaks were taken more frequently and needed more UE support today. During 4 square stepping, he lost his balance more than normal and needed to use UE support to catch himself. Pt continues to demonstrate decreased RLE strength compared to L side. He does require min VCs for proper positioning/exercise technique for optimal strengthening. He would benefit from additional skilled PT intervention to improve strength, balance and mobility.             PT Short Term Goals - 07/24/19 1132      PT SHORT TERM GOAL #1   Title Patient will be adherent to HEP at least 3x a week to improve functional strength and balance for better safety at home.    Baseline 05/12/19: "I do it some of the time." 07/22/19: Partially compliant    Time 4    Period Weeks    Status Partially Met    Target Date 08/14/19      PT SHORT TERM GOAL #2   Title Patient will increase BLE gross strength to 4/5 particularly in right hip as to improve functional strength for independent gait, increased standing tolerance and increased ADL ability    Baseline see visit note 05/12/19; 07/22/19: Deferred; 07/24/19: see note    Time 4    Period Weeks    Status Partially Met    Target Date 08/14/19      PT SHORT TERM GOAL #3   Title Patient will be mod I for all bed mobility including rolling and supine to sit without difficulty to increase independence at home.    Baseline 05/12/19: Able to roll to both directions and move from sidelying to sitting, more challenging rolling to the R side and pushing up with RUE    Time 4    Period Weeks    Status Achieved             PT Long Term Goals - 07/24/19 1132      PT LONG TERM GOAL #1   Title Patient (> 20 years old) will complete five times sit to stand test in < 15 seconds indicating an increased LE strength and  improved balance.    Baseline 05/12/19: 16.3s; 07/22/19: 13.9s    Time 8    Period Weeks    Status Achieved      PT LONG TERM GOAL #2   Title Patient will demonstrate an improved Berg Balance Score of >48/56 as to demonstrate improved balance with ADLs such as sitting/standing and transfer balance and reduced fall risk.    Baseline 05/12/19: 47/56; 07/22/19: 45/56    Time 8    Period Weeks    Status  Partially Met    Target Date 09/11/19      PT LONG TERM GOAL #3   Title Patient will increase six minute walk test distance to >1000 for progression to community ambulator and improve gait ability    Baseline 05/12/19: 800'; 07/22/19: 700'    Time 8    Period Weeks    Status On-going    Target Date 09/11/19      PT LONG TERM GOAL #4   Title Patient will tolerate 5 seconds of single leg stance without loss of balance to improve ability to get in and out of shower safely.    Baseline 05/12/19: 1-2s; 07/22/19: unchanged (1-2s), more difficulty on RLE    Time 8    Period Weeks    Status On-going    Target Date 09/11/19                 Plan - 08/20/19 1026    Clinical Impression Statement Pt was motivated and participated well within session. No R flank pain upon arrival and does not increase during session. Patient reports being tired and feeling weak, so rests breaks were taken more frequently and needed more UE support today. During 4 square stepping, he lost his balance more than normal and needed to use UE support to catch himself. Pt continues to demonstrate decreased RLE strength compared to L side. He does require min VCs for proper positioning/exercise technique for optimal strengthening. He would benefit from additional skilled PT intervention to improve strength, balance and mobility.    Personal Factors and Comorbidities Comorbidity 3+;Age;Time since onset of injury/illness/exacerbation    Comorbidities recent stroke, DM x2 (on medication), HTN (somewhat controlled), CKD, HLD, RA  (worse in right hand)    Examination-Activity Limitations Bed Mobility;Caring for Others;Carry;Dressing;Lift;Locomotion Level;Reach Overhead;Squat;Stairs;Stand;Transfers    Examination-Participation Restrictions Church;Cleaning;Community Activity;Driving;Laundry;Meal Prep;School;Shop;Volunteer;Yard Work    Stability/Clinical Decision Making Stable/Uncomplicated    Rehab Potential Good    PT Frequency 2x / week    PT Duration 8 weeks    PT Treatment/Interventions ADLs/Self Care Home Management;Cryotherapy;Electrical Stimulation;Moist Heat;Gait training;Functional mobility Scientist, forensic;Therapeutic activities;Therapeutic exercise;Balance training;Neuromuscular re-education;Patient/family education;Orthotic Fit/Training;Energy conservation    PT Next Visit Plan Continue with progression in balance training, strength and conditioning    PT Home Exercise Plan Medbridge Access Code: [email or web address]    Consulted and Agree with Plan of Care Patient           Patient will benefit from skilled therapeutic intervention in order to improve the following deficits and impairments:  Abnormal gait, Decreased balance, Decreased endurance, Decreased mobility, Difficulty walking, Impaired perceived functional ability, Cardiopulmonary status limiting activity, Decreased activity tolerance, Decreased safety awareness, Decreased strength  Visit Diagnosis: Muscle weakness (generalized)  Other lack of coordination  Unsteadiness on feet     Problem List Patient Active Problem List   Diagnosis Date Noted   Stroke ([place]) 11/19/2018   Syncope 10/21/2015   Drug-induced pneumonitis 04/02/2015   COPD (chronic obstructive pulmonary disease) ([place]) 04/02/2015   Dyspnea 04/02/2015   COPD exacerbation ([place]) 03/11/2015   CAFL (chronic airflow limitation) ([place]) 08/14/2014   HLD (hyperlipidemia) 08/14/2014   BP (high blood pressure) 08/14/2014   Diabetes mellitus type 2, uncontrolled ([place]) 08/14/2014    Chronic kidney disease (CKD), stage III (moderate) 01/27/2014   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 I have personally read, edited and approve of the note as written.  Noemi Chapel, SPT Phillips Grout PT, DPT, GCS  Huprich,Jason 08/20/2019, 1:38 PM  [place] MAIN [facility] SERVICES [address] [place], [place], [address] Phone: [phone]   Fax:  [phone]  Name: Isaac Stevens MRN: [id number] Date of Birth: [birth year]

## 2019-08-22 DIAGNOSIS — N1831 Chronic kidney disease, stage 3a: Secondary | ICD-10-CM | POA: Diagnosis not present

## 2019-08-22 DIAGNOSIS — E118 Type 2 diabetes mellitus with unspecified complications: Secondary | ICD-10-CM | POA: Diagnosis not present

## 2019-08-22 DIAGNOSIS — Z8673 Personal history of transient ischemic attack (TIA), and cerebral infarction without residual deficits: Secondary | ICD-10-CM | POA: Diagnosis not present

## 2019-08-22 DIAGNOSIS — I10 Essential (primary) hypertension: Secondary | ICD-10-CM | POA: Diagnosis not present

## 2019-08-22 DIAGNOSIS — D692 Other nonthrombocytopenic purpura: Secondary | ICD-10-CM | POA: Diagnosis not present

## 2019-08-22 DIAGNOSIS — M064 Inflammatory polyarthropathy: Secondary | ICD-10-CM | POA: Diagnosis not present

## 2019-08-22 DIAGNOSIS — E538 Deficiency of other specified B group vitamins: Secondary | ICD-10-CM | POA: Diagnosis not present

## 2019-08-22 DIAGNOSIS — E1165 Type 2 diabetes mellitus with hyperglycemia: Secondary | ICD-10-CM | POA: Diagnosis not present

## 2019-08-22 DIAGNOSIS — E7849 Other hyperlipidemia: Secondary | ICD-10-CM | POA: Diagnosis not present

## 2019-08-22 DIAGNOSIS — J449 Chronic obstructive pulmonary disease, unspecified: Secondary | ICD-10-CM | POA: Diagnosis not present

## 2019-08-25 ENCOUNTER — Ambulatory Visit: Payer: PPO | Admitting: Occupational Therapy

## 2019-08-25 ENCOUNTER — Encounter: Payer: Self-pay | Admitting: Occupational Therapy

## 2019-08-25 ENCOUNTER — Other Ambulatory Visit: Payer: Self-pay

## 2019-08-25 ENCOUNTER — Ambulatory Visit: Payer: PPO

## 2019-08-25 DIAGNOSIS — M6281 Muscle weakness (generalized): Secondary | ICD-10-CM

## 2019-08-25 DIAGNOSIS — R278 Other lack of coordination: Secondary | ICD-10-CM

## 2019-08-25 DIAGNOSIS — R2681 Unsteadiness on feet: Secondary | ICD-10-CM

## 2019-08-25 NOTE — Therapy (Signed)
[place] MAIN [facility] SERVICES [address] [place], [place], [address] Phone: [phone]   Fax:  [phone]  Physical Therapy Treatment  Patient Details  Name: Isaac Stevens MRN: [id number] Date of Birth: 1935-02-24 Referring Provider (PT): Rachelle Hora, MD   Encounter Date: 08/25/2019   PT End of Session - 08/25/19 1102    Visit Number 26    Number of Visits 49    Date for PT Re-Evaluation 09/11/19    Authorization Type Pn 0/3/55, recertification 9/74/16    PT Start Time 1021    PT Stop Time 1100    PT Time Calculation (min) 39 min    Equipment Utilized During Treatment Gait belt    Activity Tolerance Patient tolerated treatment well;No increased pain;Patient limited by fatigue    Behavior During Therapy [facility] for tasks assessed/performed           Past Medical History:  Diagnosis Date  . CAFL (chronic airflow limitation) (HCC)   . CKD (chronic kidney disease)   . Diabetes mellitus without complication ([place])   . Hyperlipidemia   . Hypertension   . Rheumatoid arthritis [facility])     Past Surgical History:  Procedure Laterality Date  . TOE SURGERY     right foot    There were no vitals filed for this visit.   Subjective Assessment - 08/25/19 1023    Subjective Pt is doing good today. Nothing new to report. Denies any stumbles or falls since last therapy session. He denies any pain in R flank area. No questions or concerns at this time.    Pertinent History Pt. is an 84y.o. male who was initially admitted to [facility] for 24 hours following a CVA on 11/19/18. Pt. was discharged, and one week later was admitted to [facility] with a CVA presenting with right sided weakness. Pt. received inpatient therapy services, and [facility] rehab services upon discharge. He is now being referred for outpatient rehab. Patient has already started outpatient OT to address RUE weakness and ADLs. 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 He has also  been referred to orthopedics in January 2021 due to RUE frozen shoulder/weakness and pain. He presents to therapy with tripod base cane which he reports using intermittently to reduce fall risk. He denies any recent falls. He denies any numbness/tingling    Limitations Standing;Walking    How long can you sit comfortably? NA    How long can you stand comfortably? 5-10 min, with fatigue    How long can you walk comfortably? 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 No/denies              TREATMENT   Ther-ex  Nustep level 3 x 3 minutes during history taking; Seated LAQ with 4# AW x 20 L LE, x 10 R LE Seated HS curls with 4# AW x 20 L LE, 10 x 10 R LE Seated Clams with GTB x 20 Seated Adduction with ball squeezes x 20   Standing exercises with 4# AW: L UE support needed Hip flexion marches x 20 BLE; Hip abduction x 20 BLE Hip extension x 20 BLE     Neuromuscular Re-education  6" alternating step-ups with LUE support alternating leading LE x 20 on each side; Sit to stands with  foam under L foot x10, no UE support needed Orange hurdle forward step-over x 20, L UE support needed Forward ladder steps, one foot in each square, cueing needed for proper stepping x 3 lengths Balancing on airex with FT eyes open, eyes closed, semitandem x 30 secs each Balancing on airex with feet apart tapping a balloon 2 x multiple bouts each UE, verbal and tactile cues for reminders to not use UE support.    Pt educated throughout session about proper posture and technique with exercises. Improved exercise technique, movement at target joints, use of target muscles after min to mod verbal, visual, tactile cues.    Pt was motivated and participated well within session. No R flank pain upon arrival and does not increase during session. Patient continues to  utilize UE support for balance exercises, cues needed to not use UE for support. Pt continues to demonstrate decreased RLE strength compared to L side. He still requires intermittent rest breaks provided due to fatigue, but took less breaks this session indicating increased endurance. He does require min VCs for proper positioning/exercise technique for optimal strengthening. He would benefit from additional skilled PT intervention to improve strength, balance and mobility.               PT Short Term Goals - 07/24/19 1132      PT SHORT TERM GOAL #1   Title Patient will be adherent to HEP at least 3x a week to improve functional strength and balance for better safety at home.    Baseline 05/12/19: "I do it some of the time." 07/22/19: Partially compliant    Time 4    Period Weeks    Status Partially Met    Target Date 08/14/19      PT SHORT TERM GOAL #2   Title Patient will increase BLE gross strength to 4/5 particularly in right hip as to improve functional strength for independent gait, increased standing tolerance and increased ADL ability    Baseline see visit note 05/12/19; 07/22/19: Deferred; 07/24/19: see note    Time 4    Period Weeks    Status Partially Met    Target Date 08/14/19      PT SHORT TERM GOAL #3   Title Patient will be mod I for all bed mobility including rolling and supine to sit without difficulty to increase independence at home.    Baseline 05/12/19: Able to roll to both directions and move from sidelying to sitting, more challenging rolling to the R side and pushing up with RUE    Time 4    Period Weeks    Status Achieved             PT Long Term Goals - 07/24/19 1132      PT LONG TERM GOAL #1   Title Patient (> 51 years old) will complete five times sit to stand test in < 15 seconds indicating an increased LE strength and improved balance.    Baseline 05/12/19: 16.3s; 07/22/19: 13.9s    Time 8    Period Weeks    Status Achieved      PT LONG TERM  GOAL #2   Title Patient will demonstrate an improved Berg Balance Score of >48/56 as to demonstrate improved balance with ADLs such as sitting/standing and transfer balance and reduced fall risk.    Baseline 05/12/19: 47/56; 07/22/19: 45/56    Time 8    Period Weeks    Status Partially Met  Target Date 09/11/19      PT LONG TERM GOAL #3   Title Patient will increase six minute walk test distance to >1000 for progression to community ambulator and improve gait ability    Baseline 05/12/19: 800'; 07/22/19: 700'    Time 8    Period Weeks    Status On-going    Target Date 09/11/19      PT LONG TERM GOAL #4   Title Patient will tolerate 5 seconds of single leg stance without loss of balance to improve ability to get in and out of shower safely.    Baseline 05/12/19: 1-2s; 07/22/19: unchanged (1-2s), more difficulty on RLE    Time 8    Period Weeks    Status On-going    Target Date 09/11/19                 Plan - 08/25/19 1103    Clinical Impression Statement Pt was motivated and participated well within session. No R flank pain upon arrival and does not increase during session. Patient continues to utilize UE support for balance exercises, cues needed to not use UE for support. Pt continues to demonstrate decreased RLE strength compared to L side. He still requires intermittent rest breaks provided due to fatigue, but took less breaks this session indicating increased endurance. He does require min VCs for proper positioning/exercise technique for optimal strengthening. He would benefit from additional skilled PT intervention to improve strength, balance and mobility.    Personal Factors and Comorbidities Comorbidity 3+;Age;Time since onset of injury/illness/exacerbation    Comorbidities recent stroke, DM x2 (on medication), HTN (somewhat controlled), CKD, HLD, RA (worse in right hand)    Examination-Activity Limitations Bed Mobility;Caring for Others;Carry;Dressing;Lift;Locomotion  Level;Reach Overhead;Squat;Stairs;Stand;Transfers    Examination-Participation Restrictions Church;Cleaning;Community Activity;Driving;Laundry;Meal Prep;School;Shop;Volunteer;Yard Work    Stability/Clinical Decision Making Stable/Uncomplicated    Rehab Potential Good    PT Frequency 2x / week    PT Duration 8 weeks    PT Treatment/Interventions ADLs/Self Care Home Management;Cryotherapy;Electrical Stimulation;Moist Heat;Gait training;Functional mobility Scientist, forensic;Therapeutic activities;Therapeutic exercise;Balance training;Neuromuscular re-education;Patient/family education;Orthotic Fit/Training;Energy conservation    PT Next Visit Plan Continue with progression in balance training, strength and conditioning    PT Home Exercise Plan Medbridge Access Code: [email or web address]    Consulted and Agree with Plan of Care Patient           Patient will benefit from skilled therapeutic intervention in order to improve the following deficits and impairments:  Abnormal gait, Decreased balance, Decreased endurance, Decreased mobility, Difficulty walking, Impaired perceived functional ability, Cardiopulmonary status limiting activity, Decreased activity tolerance, Decreased safety awareness, Decreased strength  Visit Diagnosis: Muscle weakness (generalized)  Other lack of coordination  Unsteadiness on feet     Problem List Patient Active Problem List   Diagnosis Date Noted  . Stroke ([place]) 11/19/2018  . Syncope 10/21/2015  . Drug-induced pneumonitis 04/02/2015  . COPD (chronic obstructive pulmonary disease) ([place]) 04/02/2015  . Dyspnea 04/02/2015  . COPD exacerbation ([place]) 03/11/2015  . CAFL (chronic airflow limitation) ([place]) 08/14/2014  . HLD (hyperlipidemia) 08/14/2014  . BP (high blood pressure) 08/14/2014  . Diabetes mellitus type 2, uncontrolled ([place]) 08/14/2014  . Chronic kidney disease (CKD), stage III (moderate) 01/27/2014  . 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 I have personally read, edited and approve of the note as written.   Noemi Chapel, SPT Phillips Grout PT, DPT,  GCS  Huprich,Jason 08/25/2019, 1:28 PM  [facility] MAIN [facility] SERVICES [address] [place], [place], [address] Phone: [phone]   Fax:  [phone]  Name: MIKHAIL HALLENBECK MRN: [id number] Date of Birth: 10-30-1935

## 2019-08-25 NOTE — Therapy (Signed)
[place] MAIN [facility] SERVICES [address] [place], [place], [address] Phone: [phone]   Fax:  [phone]  Occupational Therapy Treatment  Patient Details  Name: Isaac Stevens MRN: [id number] Date of Birth: 02/18/1935 Referring Provider (OT): Dorise Hiss, [place]   Encounter Date: 08/25/2019   OT End of Session - 08/25/19 1103    Visit Number 32    Number of Visits 19    Date for OT Re-Evaluation 11/10/19    Authorization Type FOTO   Progress report period starting 02/20/2019    OT Start Time 1100    OT Stop Time 1145    OT Time Calculation (min) 45 min    Activity Tolerance Patient tolerated treatment well    Behavior During Therapy [facility] for tasks assessed/performed           Past Medical History:  Diagnosis Date  . CAFL (chronic airflow limitation) (HCC)   . CKD (chronic kidney disease)   . Diabetes mellitus without complication ([place])   . Hyperlipidemia   . Hypertension   . Rheumatoid arthritis [facility])     Past Surgical History:  Procedure Laterality Date  . TOE SURGERY     right foot    There were no vitals filed for this visit.   Subjective Assessment - 08/25/19 1102    Subjective  Pt. reports feeling tired    Patient is accompanied by: Family member    Pertinent History Pt. is an 84 y.o. male who was initially admitted to [facility] for 24 hours following a CVA. Pt. was discharged, and one week later was admitted to [facility] with a CVA presenting with right sidedl weakness. Pt. received inpatient therapy services, and HHOT services upon discharge. Pt. is now ready for outpatient OT services.    Currently in Pain? No/denies          OT TREATMENT    Neuro muscular re-education:  Therapeutic Exercise:  Pt. performed 2# dowel ex. For UE strengthening secondary to weakness. Bilateral shoulder flexion, chest press, and circular patterns were performed.  Selfcare:  Pt. worked on Media planner. Pt. was able to  formulate lists of words in printed form with 50% legibility for words on the checks. Pt. continues to work on maintaining a mature tripod grasp on a wide width pen however continues to write words while moving the UE as one unit. Pt. Continues to require cues for prewriting exercises to improve distal digit mobility, and pen grasp.  Pt. Continues to formulate the letters while using his whole UE as one unit.Pt. worked on pre writing exercises with the pen in his fingers.  Manual Therapy:  Pt. tolerated retrograde massage to the right hand for edema control. Pt. edema circumferential measurements Prior to manual therapy: wrist: 20cm, metacarpals 24cm. After manual techniques it improve to wrist: 18.5 cm, Metacarpals: 22.5 cm. Pt. Tolerated soft tissue mobilizations for metacarpal spread stretches. Pt education was provided about retrograde massage for edema control. Manual techniques were performed independent of Therapeutic Ex.  Pt. Continues to improve with his right UE functioning, and continues to try to engage it more during ADLs, and IADL tasks at home. Pt. Shucked corn for his wife this weekend, and is engaging his right hand during self-feeding using a fork. Pt. has improved edema by 1.5cm at the wrist, and metacarpals following edema manual control techniques. Pt. Is improving with writing legibly in order to be able to complete important documents. Pt. Is improving with writing longer words  with filling out simulated checks. Pt. Continues to work on maintaining a mature pen grasp when writing, increasing distal mobility needed to move the pen across the paper when formulating letters. Pt. continues to benefit from OT services to improve writing legibility, improve edema, and improve RUE functioning during ADLs, and IADLs.                        OT Education - 08/25/19 1103    Education Details [employer]) Educated Patient    Methods Explanation;Demonstration     Comprehension Verbalized understanding;Returned demonstration               OT Long Term Goals - 08/18/19 1131      OT LONG TERM GOAL #1   Title Pt. will increase Right shoulder AROM  in order to be able to reach into a cabinet.    Baseline 20 degrees of isolated flexion. Pt. has difficulty reaching up into cabinets.    Time 12    Period Weeks    Status On-going    Target Date 11/10/19      OT LONG TERM GOAL #2   Title Pt. will increase right shoulder abduction to be able to independently wash his hair.    Baseline Pt. continues to present with limited abduction, and is unable to reach up to his head to wash his hair.    Time 12    Period Weeks    Status On-going    Target Date 11/10/19      OT LONG TERM GOAL #3   Title Pt. will perform self-feeding with his right hand with minA.    Baseline Pt. continues to require modA, and is able to assist with using a fork with the right hand  when eating an eggs.    Time 12    Period Weeks    Status On-going    Target Date 10/11/19      OT LONG TERM GOAL #4   Title Pt. will increase right wrist extension by 10 degrees in preparation for reaching for a glass.    Baseline Pt. is improving with holding a glass, however is not able to bring it to his mouth    Time 12    Period Weeks    Target Date 10/11/19      OT LONG TERM GOAL #5   Title Pt. will independently use his right hand to wash his face.    Baseline Pt. is now able to reach his face with his right hand. Pt.is unable to thoroughy  washing motion.    Time 12    Period Weeks    Status On-going    Target Date 10/11/19      Long Term Additional Goals   Additional Long Term Goals Yes      OT LONG TERM GOAL #6   Title Pt. will be independent with UE dressing    Baseline Pt. is independent with increased time. Assist with buttons only.    Time 12    Period Weeks    Status On-going    Target Date 11/10/19      OT LONG TERM GOAL #7   Title Pt. will require minA LE  dressing    Baseline Pt is independent donning socks. Pt. requires modA with pants, and shoes with increased time    Time 12    Period Weeks    Status On-going    Target Date  11/10/19      OT LONG TERM GOAL #8   Title Pt. will improve FOT score by 2 points for improvied UE functioning    Baseline 08/18/2019: Current FOTO score:50    Time 12    Period Weeks    Status New    Target Date 11/10/19                 Plan - 08/25/19 1104    Clinical Impression Statement Pt. Continues to improve with his right UE functioning, and continues to try to engage it more during ADLs, and IADL tasks at home. Pt. Shucked corn for his wife this weekend, and is engaging his right hand during self-feeding using a fork. Pt. has improved edema by 1.5cm at the wrist, and metacarpals following edema manual control techniques. Pt. Is improving with writing legibly in order to be able to complete important documents. Pt. Is improving with writing longer words with filling out simulated checks. Pt. Continues to work on maintaining a mature pen grasp when writing, increasing distal mobility needed to move the pen across the paper when formulating letters. Pt. continues to benefit from OT services to improve writing legibility, improve edema, and improve RUE functioning during ADLs, and IADLs.   Occupational performance deficits (Please refer to evaluation for details): ADL's;IADL's;Education    Body Structure / Function / Physical Skills ADL;FMC;IADL;UE functional use;Dexterity;Strength;ROM;Coordination    Rehab Potential Good    Clinical Decision Making Several treatment options, min-mod task modification necessary    Comorbidities Affecting Occupational Performance: May have comorbidities impacting occupational performance    Modification or Assistance to Complete Evaluation  Min-Moderate modification of tasks or assist with assess necessary to complete eval    OT Frequency 2x / week    OT Duration 12 weeks     OT Treatment/Interventions Self-care/ADL training;Patient/family education;Therapeutic activities;DME and/or AE instruction;Neuromuscular education;Therapeutic exercise    Consulted and Agree with Plan of Care Patient           Patient will benefit from skilled therapeutic intervention in order to improve the following deficits and impairments:   Body Structure / Function / Physical Skills: ADL, FMC, IADL, UE functional use, Dexterity, Strength, ROM, Coordination       Visit Diagnosis: Muscle weakness (generalized)  Other lack of coordination    Problem List Patient Active Problem List   Diagnosis Date Noted  . Stroke ([place]) 11/19/2018  . Syncope 10/21/2015  . Drug-induced pneumonitis 04/02/2015  . COPD (chronic obstructive pulmonary disease) ([place]) 04/02/2015  . Dyspnea 04/02/2015  . COPD exacerbation ([place]) 03/11/2015  . CAFL (chronic airflow limitation) ([place]) 08/14/2014  . HLD (hyperlipidemia) 08/14/2014  . BP (high blood pressure) 08/14/2014  . Diabetes mellitus type 2, uncontrolled ([place]) 08/14/2014  . Chronic kidney disease (CKD), stage III (moderate) 01/27/2014  . 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

## 2019-08-27 ENCOUNTER — Ambulatory Visit: Payer: PPO

## 2019-08-27 ENCOUNTER — Ambulatory Visit: Payer: PPO | Admitting: Occupational Therapy

## 2019-09-01 ENCOUNTER — Ambulatory Visit: Payer: PPO

## 2019-09-01 ENCOUNTER — Ambulatory Visit: Payer: PPO | Admitting: Occupational Therapy

## 2019-09-01 ENCOUNTER — Other Ambulatory Visit: Payer: Self-pay

## 2019-09-01 ENCOUNTER — Encounter: Payer: Self-pay | Admitting: Occupational Therapy

## 2019-09-01 DIAGNOSIS — M6281 Muscle weakness (generalized): Secondary | ICD-10-CM

## 2019-09-01 DIAGNOSIS — R278 Other lack of coordination: Secondary | ICD-10-CM

## 2019-09-01 DIAGNOSIS — R2681 Unsteadiness on feet: Secondary | ICD-10-CM

## 2019-09-01 NOTE — Therapy (Signed)
[place] MAIN [facility] SERVICES [address] [place], [place], [address] Phone: [phone]   Fax:  [phone]  Occupational Therapy Treatment  Patient Details  Name: [place] MRN: [id number] Date of Birth: 1935/10/16 Referring Provider (OT): Dorise Hiss, [place]   Encounter Date: 09/01/2019   OT End of Session - 09/01/19 1506    Visit Number 33    Number of Visits 67    Date for OT Re-Evaluation 11/10/19    Authorization Type FOTO   Progress report period starting 02/20/2019    OT Start Time 1100    OT Stop Time 1145    OT Time Calculation (min) 45 min    Activity Tolerance Patient tolerated treatment well    Behavior During Therapy [facility] for tasks assessed/performed           Past Medical History:  Diagnosis Date  . CAFL (chronic airflow limitation) (HCC)   . CKD (chronic kidney disease)   . Diabetes mellitus without complication ([place])   . Hyperlipidemia   . Hypertension   . Rheumatoid arthritis [facility])     Past Surgical History:  Procedure Laterality Date  . TOE SURGERY     right foot    There were no vitals filed for this visit.   Subjective Assessment - 09/01/19 1505    Subjective  Pt. reports feeling tired    Pertinent History Pt. is an 84 y.o. male who was initially admitted to [facility] for 24 hours following a CVA. Pt. was discharged, and one week later was admitted to [facility] with a CVA presenting with right sidedl weakness. Pt. received inpatient therapy services, and HHOT services upon discharge. Pt. is now ready for outpatient OT services.    Special Tests FOTO score: 43    Currently in Pain? No/denies           OT TREATMENT   Selfcare:  Pt. worked on Media planner. Pt. was able to formulate sentences in printed form with 50% legibility with increasing text size with each sentence. Pt. Continues to work on maintaining a mature tripod grasp on a wide width pen. Pt. Required cues for prewriting exercises to  improve distal digit mobility, and pen grasp.  Pt. Worked on prewriting exercise to encourage digit movement during the task, and digit flexion.  Manual Therapy:  Pt. tolerated retrograde massage to the right hand for edema control. Pt. edema circumferential measurements Prior to manual therapy: wrist: 19cm, metacarpals 24cm. After manual techniques it improve to wrist: 18.5cm, Metacarpals: 22.5cm. Pt. Tolerated soft tissue mobilizations for metacarpal spread stretches. Pt education was provided about retrograde massage for edema control.  Pt. is responding well to the edema control techniques. Pt. Is improving with his right UE functioning, and continues to try to engage it more during ADLs, and IADL tasks at home.  Pt. Is improving with writing legibly in order to be able to complete important documents. Pt. Continues to work on maintaining a mature pen grasp when writing, andincreasing distal mobility needed to move the pen across the paper when formulating letters. Pt. continues to benefit from OT services to improve writing legibility, improve edema, and improve RUE functioning during ADLs, and IADLs.                       OT Education - 09/01/19 1506    Education Details [employer]) Educated Patient    Methods Explanation;Demonstration    Comprehension Verbalized understanding;Returned  demonstration               OT Long Term Goals - 08/18/19 1131      OT LONG TERM GOAL #1   Title Pt. will increase Right shoulder AROM  in order to be able to reach into a cabinet.    Baseline 20 degrees of isolated flexion. Pt. has difficulty reaching up into cabinets.    Time 12    Period Weeks    Status On-going    Target Date 11/10/19      OT LONG TERM GOAL #2   Title Pt. will increase right shoulder abduction to be able to independently wash his hair.    Baseline Pt. continues to present with limited abduction, and is unable to reach up to his head to wash  his hair.    Time 12    Period Weeks    Status On-going    Target Date 11/10/19      OT LONG TERM GOAL #3   Title Pt. will perform self-feeding with his right hand with minA.    Baseline Pt. continues to require modA, and is able to assist with using a fork with the right hand  when eating an eggs.    Time 12    Period Weeks    Status On-going    Target Date 10/11/19      OT LONG TERM GOAL #4   Title Pt. will increase right wrist extension by 10 degrees in preparation for reaching for a glass.    Baseline Pt. is improving with holding a glass, however is not able to bring it to his mouth    Time 12    Period Weeks    Target Date 10/11/19      OT LONG TERM GOAL #5   Title Pt. will independently use his right hand to wash his face.    Baseline Pt. is now able to reach his face with his right hand. Pt.is unable to thoroughy  washing motion.    Time 12    Period Weeks    Status On-going    Target Date 10/11/19      Long Term Additional Goals   Additional Long Term Goals Yes      OT LONG TERM GOAL #6   Title Pt. will be independent with UE dressing    Baseline Pt. is independent with increased time. Assist with buttons only.    Time 12    Period Weeks    Status On-going    Target Date 11/10/19      OT LONG TERM GOAL #7   Title Pt. will require minA LE dressing    Baseline Pt is independent donning socks. Pt. requires modA with pants, and shoes with increased time    Time 12    Period Weeks    Status On-going    Target Date 11/10/19      OT LONG TERM GOAL #8   Title Pt. will improve FOT score by 2 points for improvied UE functioning    Baseline 08/18/2019: Current FOTO score:50    Time 12    Period Weeks    Status New    Target Date 11/10/19                 Plan - 09/01/19 1506    Clinical Impression Statement Pt. is responding well to the edema control techniques. Pt. Is improving with his right UE functioning, and continues to try to engage  it more during  ADLs, and IADL tasks at home.  Pt. Is improving with writing legibly in order to be able to complete important documents. Pt. Continues to work on maintaining a mature pen grasp when writing, andincreasing distal mobility needed to move the pen across the paper when formulating letters. Pt. continues to benefit from OT services to improve writing legibility, improve edema, and improve RUE functioning during ADLs, and IADLs.    Occupational performance deficits (Please refer to evaluation for details): ADL's;IADL's;Education    Body Structure / Function / Physical Skills ADL;FMC;IADL;UE functional use;Dexterity;Strength;ROM;Coordination    Clinical Decision Making Several treatment options, min-mod task modification necessary    Comorbidities Affecting Occupational Performance: May have comorbidities impacting occupational performance    Modification or Assistance to Complete Evaluation  Min-Moderate modification of tasks or assist with assess necessary to complete eval    OT Frequency 2x / week    OT Duration 12 weeks    OT Treatment/Interventions Self-care/ADL training;Patient/family education;Therapeutic activities;DME and/or AE instruction;Neuromuscular education;Therapeutic exercise    Consulted and Agree with Plan of Care Patient    Family Member Consulted daughter, Marcie Bal           Patient will benefit from skilled therapeutic intervention in order to improve the following deficits and impairments:   Body Structure / Function / Physical Skills: ADL, FMC, IADL, UE functional use, Dexterity, Strength, ROM, Coordination       Visit Diagnosis: Muscle weakness (generalized)  Other lack of coordination    Problem List Patient Active Problem List   Diagnosis Date Noted  . Stroke ([place]) 11/19/2018  . Syncope 10/21/2015  . Drug-induced pneumonitis 04/02/2015  . COPD (chronic obstructive pulmonary disease) ([place]) 04/02/2015  . Dyspnea 04/02/2015  . COPD exacerbation ([place]) 03/11/2015   . CAFL (chronic airflow limitation) ([place]) 08/14/2014  . HLD (hyperlipidemia) 08/14/2014  . BP (high blood pressure) 08/14/2014  . Diabetes mellitus type 2, uncontrolled ([place]) 08/14/2014  . Chronic kidney disease (CKD), stage III (moderate) 01/27/2014  . 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

## 2019-09-01 NOTE — Therapy (Signed)
[place] MAIN [facility] SERVICES [address] [place], [place], [address] Phone: [phone]   Fax:  [phone]  Physical Therapy Treatment  Patient Details  Name: [place] MRN: [id number] Date of Birth: [birth year] Referring Provider (PT): Rachelle Hora, MD   Encounter Date: 09/01/2019   PT End of Session - 09/01/19 1021    Visit Number 27    Number of Visits 49    Date for PT Re-Evaluation 09/11/19    Authorization Type Pn 0/3/55, recertification 9/74/16    PT Start Time 1016    PT Stop Time 1058    PT Time Calculation (min) 42 min    Equipment Utilized During Treatment Gait belt    Activity Tolerance Patient tolerated treatment well;No increased pain;Patient limited by fatigue    Behavior During Therapy [facility] for tasks assessed/performed           Past Medical History:  Diagnosis Date  . CAFL (chronic airflow limitation) (HCC)   . CKD (chronic kidney disease)   . Diabetes mellitus without complication ([place])   . Hyperlipidemia   . Hypertension   . Rheumatoid arthritis [facility])     Past Surgical History:  Procedure Laterality Date  . TOE SURGERY     right foot    There were no vitals filed for this visit.   Subjective Assessment - 09/01/19 1019    Subjective Pt is doing good today. Nothing new to report. Denies any stumbles or falls since last therapy session. Patient notes that he thinks R flank pain is completely gone as he hasn't had any pain in the last couple of weeks. No questions or concerns at this time.    Pertinent History Pt. is an 84y.o. male who was initially admitted to [facility] for 24 hours following a CVA on 11/19/18. Pt. was discharged, and one week later was admitted to [facility] with a CVA presenting with right sided weakness. Pt. received inpatient therapy services, and [place] rehab services upon discharge. He is now being referred for outpatient rehab. Patient has already started outpatient OT to address RUE weakness and  ADLs. 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 He has also been referred to orthopedics in January 2021 due to RUE frozen shoulder/weakness and pain. He presents to therapy with tripod base cane which he reports using intermittently to reduce fall risk. He denies any recent falls. He denies any numbness/tingling    Limitations Standing;Walking    How long can you sit comfortably? NA    How long can you stand comfortably? 5-10 min, with fatigue    How long can you walk comfortably? 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 No/denies             TREATMENT    Ther-ex  Nustep level 3 x 5 minutes during history taking, adjusting resistance (unbilled 2 minutes) Seated LAQ with 5# AW x 10 each Seated HS curls with 5# AW x 10 each Seated Clams with GTB x 20 Seated Adduction with ball squeezes x 20   Standing exercises with 5# AW: L UE support needed Hip flexion marches x 10 BLE; Hip abduction x 10 BLE Hip extension x 10 BLE     Neuromuscular Re-education  6" alternating step-ups with LUE support alternating leading LE  x 20 on each side; Sit to stands with 6" box under L foot x10, no UE support needed Sit to stands with foam under feet with mod to min L UE support x10; Orange hurdle forward step-over x 20, L UE support needed Balancing on airex with feet apart tapping a balloon 2 x multiple bouts each UE, verbal and tactile cues for reminders to not use UE support; Stepping over half foam roller with ball catching/tossing x 10 each, difficulty stepping backwards; cued for no UE use Stepping over line on floor with ball catching/tossing x 5 R LE, easier time stepping back   Pt educated throughout session about proper posture and technique with exercises. Improved exercise technique, movement at target joints, use of target  muscles after min to mod verbal, visual, tactile cues.    Pt was motivated and participated well within session. Session focused on a combination of strengthening and balance exercises. Increased weight and decreased reps for strengthening exercises, indicating increased strength. Patient continues to utilize UE support for balance exercises, cues needed to not use UE for support. Pt continues to demonstrate decreased RLE strength compared to L side. He requires longer intermittent rest breaks today provided due to fatigue. He does require min VCs for proper positioning/exercise technique for optimal strengthening. He would benefit from additional skilled PT intervention to improve strength, balance and mobility.           PT Short Term Goals - 07/24/19 1132      PT SHORT TERM GOAL #1   Title Patient will be adherent to HEP at least 3x a week to improve functional strength and balance for better safety at home.    Baseline 05/12/19: "I do it some of the time." 07/22/19: Partially compliant    Time 4    Period Weeks    Status Partially Met    Target Date 08/14/19      PT SHORT TERM GOAL #2   Title Patient will increase BLE gross strength to 4/5 particularly in right hip as to improve functional strength for independent gait, increased standing tolerance and increased ADL ability    Baseline see visit note 05/12/19; 07/22/19: Deferred; 07/24/19: see note    Time 4    Period Weeks    Status Partially Met    Target Date 08/14/19      PT SHORT TERM GOAL #3   Title Patient will be mod I for all bed mobility including rolling and supine to sit without difficulty to increase independence at home.    Baseline 05/12/19: Able to roll to both directions and move from sidelying to sitting, more challenging rolling to the R side and pushing up with RUE    Time 4    Period Weeks    Status Achieved             PT Long Term Goals - 07/24/19 1132      PT LONG TERM GOAL #1   Title Patient (> 55  years old) will complete five times sit to stand test in < 15 seconds indicating an increased LE strength and improved balance.    Baseline 05/12/19: 16.3s; 07/22/19: 13.9s    Time 8    Period Weeks    Status Achieved      PT LONG TERM GOAL #2   Title Patient will demonstrate an improved Berg Balance Score of >48/56 as to demonstrate improved balance with ADLs such as sitting/standing and transfer balance and reduced fall risk.  Baseline 05/12/19: 47/56; 07/22/19: 45/56    Time 8    Period Weeks    Status Partially Met    Target Date 09/11/19      PT LONG TERM GOAL #3   Title Patient will increase six minute walk test distance to >1000 for progression to community ambulator and improve gait ability    Baseline 05/12/19: 800'; 07/22/19: 700'    Time 8    Period Weeks    Status On-going    Target Date 09/11/19      PT LONG TERM GOAL #4   Title Patient will tolerate 5 seconds of single leg stance without loss of balance to improve ability to get in and out of shower safely.    Baseline 05/12/19: 1-2s; 07/22/19: unchanged (1-2s), more difficulty on RLE    Time 8    Period Weeks    Status On-going    Target Date 09/11/19                 Plan - 09/01/19 1022    Clinical Impression Statement Pt was motivated and participated well within session. Session focused on a combination of strengthening and balance exercises. Increased weight and decreased reps for strengthening exercises, indicating increased strength. Patient continues to utilize UE support for balance exercises, cues needed to not use UE for support. Pt continues to demonstrate decreased RLE strength compared to L side. He requires longer intermittent rest breaks today provided due to fatigue. He does require min VCs for proper positioning/exercise technique for optimal strengthening. He would benefit from additional skilled PT intervention to improve strength, balance and mobility.    Personal Factors and Comorbidities Comorbidity  3+;Age;Time since onset of injury/illness/exacerbation    Comorbidities recent stroke, DM x2 (on medication), HTN (somewhat controlled), CKD, HLD, RA (worse in right hand)    Examination-Activity Limitations Bed Mobility;Caring for Others;Carry;Dressing;Lift;Locomotion Level;Reach Overhead;Squat;Stairs;Stand;Transfers    Examination-Participation Restrictions Church;Cleaning;Community Activity;Driving;Laundry;Meal Prep;School;Shop;Volunteer;Yard Work    Stability/Clinical Decision Making Stable/Uncomplicated    Rehab Potential Good    PT Frequency 2x / week    PT Duration 8 weeks    PT Treatment/Interventions ADLs/Self Care Home Management;Cryotherapy;Electrical Stimulation;Moist Heat;Gait training;Functional mobility Scientist, forensic;Therapeutic activities;Therapeutic exercise;Balance training;Neuromuscular re-education;Patient/family education;Orthotic Fit/Training;Energy conservation    PT Next Visit Plan Continue with progression in balance training, strength and conditioning    PT Home Exercise Plan Medbridge Access Code: [email or web address]    Consulted and Agree with Plan of Care Patient           Patient will benefit from skilled therapeutic intervention in order to improve the following deficits and impairments:  Abnormal gait, Decreased balance, Decreased endurance, Decreased mobility, Difficulty walking, Impaired perceived functional ability, Cardiopulmonary status limiting activity, Decreased activity tolerance, Decreased safety awareness, Decreased strength  Visit Diagnosis: Muscle weakness (generalized)  Other lack of coordination  Unsteadiness on feet     Problem List Patient Active Problem List   Diagnosis Date Noted  . Stroke ([place]) 11/19/2018  . Syncope 10/21/2015  . Drug-induced pneumonitis 04/02/2015  . COPD (chronic obstructive pulmonary disease) ([place]) 04/02/2015  . Dyspnea 04/02/2015  . COPD exacerbation ([place]) 03/11/2015  . CAFL (chronic airflow limitation)  ([place]) 08/14/2014  . HLD (hyperlipidemia) 08/14/2014  . BP (high blood pressure) 08/14/2014  . Diabetes mellitus type 2, uncontrolled ([place]) 08/14/2014  . Chronic kidney disease (CKD), stage III (moderate) 01/27/2014  . 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 I have personally read, edited and approve of the note as written.   Noemi Chapel, SPT Phillips Grout PT, DPT, GCS  Huprich,Jason 09/01/2019, 2:15 PM  [place] MAIN [facility] SERVICES [address] [place], [place], [address] Phone: [phone]   Fax:  [phone]  Name: SEATON HOFMANN MRN: [id number] Date of Birth: 14-Dec-1935

## 2019-09-03 ENCOUNTER — Ambulatory Visit: Payer: PPO

## 2019-09-03 ENCOUNTER — Encounter: Payer: Self-pay | Admitting: Occupational Therapy

## 2019-09-03 ENCOUNTER — Other Ambulatory Visit: Payer: Self-pay

## 2019-09-03 ENCOUNTER — Ambulatory Visit: Payer: PPO | Admitting: Occupational Therapy

## 2019-09-03 DIAGNOSIS — R278 Other lack of coordination: Secondary | ICD-10-CM

## 2019-09-03 DIAGNOSIS — M6281 Muscle weakness (generalized): Secondary | ICD-10-CM

## 2019-09-03 DIAGNOSIS — R2681 Unsteadiness on feet: Secondary | ICD-10-CM

## 2019-09-03 NOTE — Therapy (Signed)
[place] MAIN [facility] SERVICES [address] [place], [place], [address] Phone: [phone]   Fax:  [phone]  Physical Therapy Treatment  Patient Details  Name: Isaac Stevens MRN: [id number] Date of Birth: 09-30-1935 Referring Provider (PT): Rachelle Hora, MD   Encounter Date: 09/03/2019   PT End of Session - 09/03/19 1021    Visit Number 28    Number of Visits 49    Date for PT Re-Evaluation 09/11/19    Authorization Type Pn 10/10/68, recertification 9/62/83    PT Start Time 1018    PT Stop Time 1100    PT Time Calculation (min) 42 min    Equipment Utilized During Treatment Gait belt    Activity Tolerance Patient tolerated treatment well;No increased pain;Patient limited by fatigue    Behavior During Therapy [facility] for tasks assessed/performed           Past Medical History:  Diagnosis Date  . CAFL (chronic airflow limitation) (HCC)   . CKD (chronic kidney disease)   . Diabetes mellitus without complication ([place])   . Hyperlipidemia   . Hypertension   . Rheumatoid arthritis [facility])     Past Surgical History:  Procedure Laterality Date  . TOE SURGERY     right foot    There were no vitals filed for this visit.   Subjective Assessment - 09/03/19 1021    Subjective Pt is doing good today. Nothing new to report. Denies any pain. No questions or concerns at this time.    Pertinent History Pt. is an 84y.o. male who was initially admitted to [facility] for 24 hours following a CVA on 11/19/18. Pt. was discharged, and one week later was admitted to [facility] with a CVA presenting with right sided weakness. Pt. received inpatient therapy services, and [facility] rehab services upon discharge. He is now being referred for outpatient rehab. Patient has already started outpatient OT to address RUE weakness and ADLs. 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 He has also been referred to orthopedics in January 2021 due to RUE frozen  shoulder/weakness and pain. He presents to therapy with tripod base cane which he reports using intermittently to reduce fall risk. He denies any recent falls. He denies any numbness/tingling    Limitations Standing;Walking    How long can you sit comfortably? NA    How long can you stand comfortably? 5-10 min, with fatigue    How long can you walk comfortably? 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 No/denies              TREATMENT     Ther-ex  Nustep level 3 x 5 minutes during history taking, adjusting resistance (unbilled 2 minutes) Seated LAQ with 5# AW x 10 each Seated HS curls with 5# AW x 10 each Seated Clams with GTB x 20 Seated Adduction with ball squeezes x 20   Standing exercises with 5# AW: L UE support needed Hip flexion marches x 10 BLE; Hip abduction x 10 BLE Hip extension x 10 BLE Heel Raises x 15 BLE   Neuromuscular Re-education  6" alternating step-ups with LUE support alternating leading LE x 20 on each side, cued to R flex hip more to stop circumduction when leading with L foot. Sit to stands with foam  under feet with no UE support x10; Balancing on airex pad with steps taps onto 6" step, L 2 finger UE support needed; Bosu lunges x 10 each LE, cued to flex knee to more ROM; Side steps on airex pad x 3 lengths, cued for no UE support and for slower speed; Step over foam roller with throws/catches x multiple bouts, cued for no UE support and to take bigger steps; Standing on airex pad with reaches and shooting in hoop x multiple bouts, cued to use R hand.  Pt educated throughout session about proper posture and technique with exercises. Improved exercise technique, movement at target joints, use of target muscles after min to mod verbal, visual, tactile cues.    Pt was motivated and participated well within  session. Session focused on a combination of strengthening and balance exercises. During step ups when leading with L foot, patient circumducts the R hip to clear the step, verbal cues used to flex and knee. Patient continues to utilize UE support for balance exercises, cues needed to not use UE for support. More dynamic balance exercises added for more challenge, as his balance has improved. Pt continues to demonstrate decreased RLE strength compared to L side. He requires longer intermittent rest breaks today provided due to fatigue. He does require min VCs for proper positioning/exercise technique for optimal strengthening. He would benefit from additional skilled PT intervention to improve strength, balance and mobility.            PT Short Term Goals - 07/24/19 1132      PT SHORT TERM GOAL #1   Title Patient will be adherent to HEP at least 3x a week to improve functional strength and balance for better safety at home.    Baseline 05/12/19: "I do it some of the time." 07/22/19: Partially compliant    Time 4    Period Weeks    Status Partially Met    Target Date 08/14/19      PT SHORT TERM GOAL #2   Title Patient will increase BLE gross strength to 4/5 particularly in right hip as to improve functional strength for independent gait, increased standing tolerance and increased ADL ability    Baseline see visit note 05/12/19; 07/22/19: Deferred; 07/24/19: see note    Time 4    Period Weeks    Status Partially Met    Target Date 08/14/19      PT SHORT TERM GOAL #3   Title Patient will be mod I for all bed mobility including rolling and supine to sit without difficulty to increase independence at home.    Baseline 05/12/19: Able to roll to both directions and move from sidelying to sitting, more challenging rolling to the R side and pushing up with RUE    Time 4    Period Weeks    Status Achieved             PT Long Term Goals - 07/24/19 1132      PT LONG TERM GOAL #1   Title  Patient (> 44 years old) will complete five times sit to stand test in < 15 seconds indicating an increased LE strength and improved balance.    Baseline 05/12/19: 16.3s; 07/22/19: 13.9s    Time 8    Period Weeks    Status Achieved      PT LONG TERM GOAL #2   Title Patient will demonstrate an improved Berg Balance Score of >48/56 as to demonstrate improved balance with ADLs  such as sitting/standing and transfer balance and reduced fall risk.    Baseline 05/12/19: 47/56; 07/22/19: 45/56    Time 8    Period Weeks    Status Partially Met    Target Date 09/11/19      PT LONG TERM GOAL #3   Title Patient will increase six minute walk test distance to >1000 for progression to community ambulator and improve gait ability    Baseline 05/12/19: 800'; 07/22/19: 700'    Time 8    Period Weeks    Status On-going    Target Date 09/11/19      PT LONG TERM GOAL #4   Title Patient will tolerate 5 seconds of single leg stance without loss of balance to improve ability to get in and out of shower safely.    Baseline 05/12/19: 1-2s; 07/22/19: unchanged (1-2s), more difficulty on RLE    Time 8    Period Weeks    Status On-going    Target Date 09/11/19                 Plan - 09/03/19 1022    Clinical Impression Statement Pt was motivated and participated well within session. Session focused on a combination of strengthening and balance exercises. During step ups when leading with L foot, patient circumducts the R hip to clear the step, verbal cues used to flex and knee. Patient continues to utilize UE support for balance exercises, cues needed to not use UE for support. More dynamic balance exercises added for more challenge, as his balance has improved. Pt continues to demonstrate decreased RLE strength compared to L side. He requires longer intermittent rest breaks today provided due to fatigue. He does require min VCs for proper positioning/exercise technique for optimal strengthening. He would benefit  from additional skilled PT intervention to improve strength, balance and mobility.    Personal Factors and Comorbidities Comorbidity 3+;Age;Time since onset of injury/illness/exacerbation    Comorbidities recent stroke, DM x2 (on medication), HTN (somewhat controlled), CKD, HLD, RA (worse in right hand)    Examination-Activity Limitations Bed Mobility;Caring for Others;Carry;Dressing;Lift;Locomotion Level;Reach Overhead;Squat;Stairs;Stand;Transfers    Examination-Participation Restrictions Church;Cleaning;Community Activity;Driving;Laundry;Meal Prep;School;Shop;Volunteer;Yard Work    Stability/Clinical Decision Making Stable/Uncomplicated    Rehab Potential Good    PT Frequency 2x / week    PT Duration 8 weeks    PT Treatment/Interventions ADLs/Self Care Home Management;Cryotherapy;Electrical Stimulation;Moist Heat;Gait training;Functional mobility Scientist, forensic;Therapeutic activities;Therapeutic exercise;Balance training;Neuromuscular re-education;Patient/family education;Orthotic Fit/Training;Energy conservation    PT Next Visit Plan Continue with progression in balance training, strength and conditioning    PT Home Exercise Plan Medbridge Access Code: [email or web address]    Consulted and Agree with Plan of Care Patient           Patient will benefit from skilled therapeutic intervention in order to improve the following deficits and impairments:  Abnormal gait, Decreased balance, Decreased endurance, Decreased mobility, Difficulty walking, Impaired perceived functional ability, Cardiopulmonary status limiting activity, Decreased activity tolerance, Decreased safety awareness, Decreased strength  Visit Diagnosis: Muscle weakness (generalized)  Other lack of coordination  Unsteadiness on feet     Problem List Patient Active Problem List   Diagnosis Date Noted  . Stroke ([place]) 11/19/2018  . Syncope 10/21/2015  . Drug-induced pneumonitis 04/02/2015  . COPD (chronic obstructive  pulmonary disease) ([place]) 04/02/2015  . Dyspnea 04/02/2015  . COPD exacerbation ([place]) 03/11/2015  . CAFL (chronic airflow limitation) ([place]) 08/14/2014  . HLD (hyperlipidemia) 08/14/2014  . BP (high blood pressure) 08/14/2014  . Diabetes  mellitus type 2, uncontrolled ([place]) 08/14/2014  . Chronic kidney disease (CKD), stage III (moderate) 01/27/2014  . 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 I have personally read, edited and approve of the note as written.   Noemi Chapel, SPT Phillips Grout PT, DPT, GCS  Huprich,Jason 09/03/2019, 5:00 PM  [place] MAIN [facility] SERVICES [address] [place], [place], [address] Phone: [phone]   Fax:  [phone]  Name: Isaac Stevens MRN: [id number] Date of Birth: 05/22/1935

## 2019-09-03 NOTE — Therapy (Addendum)
[place] MAIN [facility] SERVICES [address] [place], [place], [address] Phone: [phone]   Fax:  [phone]  Occupational Therapy Treatment  Patient Details  Name: Isaac Stevens MRN: [id number] Date of Birth: August 12, 1935 Referring Provider (OT): Dorise Hiss, [place]   Encounter Date: 09/03/2019   OT End of Session - 09/03/19 1105    Visit Number 34    Number of Visits 69    Date for OT Re-Evaluation 11/10/19    Authorization Type FOTO   Progress report period starting 02/20/2019    OT Start Time 1100    OT Stop Time 1145    OT Time Calculation (min) 45 min    Activity Tolerance Patient tolerated treatment well    Behavior During Therapy [facility] for tasks assessed/performed           Past Medical History:  Diagnosis Date  . CAFL (chronic airflow limitation) (HCC)   . CKD (chronic kidney disease)   . Diabetes mellitus without complication ([place])   . Hyperlipidemia   . Hypertension   . Rheumatoid arthritis [facility])     Past Surgical History:  Procedure Laterality Date  . TOE SURGERY     right foot    There were no vitals filed for this visit.   Subjective Assessment - 09/03/19 1129    Subjective  Pt. reports feeling  good today.    Patient is accompanied by: Family member    Pertinent History Pt. is an 84 y.o. male who was initially admitted to [facility] for 24 hours following a CVA. Pt. was discharged, and one week later was admitted to [facility] with a CVA presenting with right sidedl weakness. Pt. received inpatient therapy services, and HHOT services upon discharge. Pt. is now ready for outpatient OT services.    Special Tests FOTO score: 43    Patient Stated Goals To regain the use of his right arm.    Currently in Pain? No/denies          OT TREATMENT   Selfcare:  Pt. worked on Media planner. Pt. was able to formulatesentences in printed form with 50% legibility with increasing text size with each sentence.Pt. Continues  to work onmaintaining a mature tripod grasp on a wide width pen. Pt. Required cues for prewriting exercises to improve distal digit mobility, and pen grasp. Pt. worked on prewriting exercise to encourage digit movement during the task, and digit flexion.  Manual Therapy:  Pt. tolerated retrograde massage to the right hand for edema control. Pt. edema circumferential measurements after manual techniques it improve to wrist: 18.5cm, Metacarpals: 23cm. Pt. Tolerated soft tissue mobilizations for metacarpal spread stretches. Pt education was provided about retrograde massage for edema control.  Pt. continues to respond well to the edema control techniques. Pt. is improving with his right UE functioning, and continues to try to engage it more during ADLs, and IADL tasks at home.  Pt. continues to improve with writing legibly in order to be able to complete important documents. Pt. continues to work on maintaining a mature pen grasp when writing, and increasing distal mobility needed to move the pen across the paper when formulating letters. Pt. continues to benefit from OT services to improve writing legibility, improve edema, and improve RUE functioning during ADLs, and IADLs.                       OT Education - 09/03/19 1104    Education Details RUE functioning  Person(s) Educated Patient    Methods Explanation;Demonstration    Comprehension Verbalized understanding;Returned demonstration               OT Long Term Goals - 08/18/19 1131      OT LONG TERM GOAL #1   Title Pt. will increase Right shoulder AROM  in order to be able to reach into a cabinet.    Baseline 20 degrees of isolated flexion. Pt. has difficulty reaching up into cabinets.    Time 12    Period Weeks    Status On-going    Target Date 11/10/19      OT LONG TERM GOAL #2   Title Pt. will increase right shoulder abduction to be able to independently wash his hair.    Baseline Pt. continues to  present with limited abduction, and is unable to reach up to his head to wash his hair.    Time 12    Period Weeks    Status On-going    Target Date 11/10/19      OT LONG TERM GOAL #3   Title Pt. will perform self-feeding with his right hand with minA.    Baseline Pt. continues to require modA, and is able to assist with using a fork with the right hand  when eating an eggs.    Time 12    Period Weeks    Status On-going    Target Date 10/11/19      OT LONG TERM GOAL #4   Title Pt. will increase right wrist extension by 10 degrees in preparation for reaching for a glass.    Baseline Pt. is improving with holding a glass, however is not able to bring it to his mouth    Time 12    Period Weeks    Target Date 10/11/19      OT LONG TERM GOAL #5   Title Pt. will independently use his right hand to wash his face.    Baseline Pt. is now able to reach his face with his right hand. Pt.is unable to thoroughy  washing motion.    Time 12    Period Weeks    Status On-going    Target Date 10/11/19      Long Term Additional Goals   Additional Long Term Goals Yes      OT LONG TERM GOAL #6   Title Pt. will be independent with UE dressing    Baseline Pt. is independent with increased time. Assist with buttons only.    Time 12    Period Weeks    Status On-going    Target Date 11/10/19      OT LONG TERM GOAL #7   Title Pt. will require minA LE dressing    Baseline Pt is independent donning socks. Pt. requires modA with pants, and shoes with increased time    Time 12    Period Weeks    Status On-going    Target Date 11/10/19      OT LONG TERM GOAL #8   Title Pt. will improve FOT score by 2 points for improvied UE functioning    Baseline 08/18/2019: Current FOTO score:50    Time 12    Period Weeks    Status New    Target Date 11/10/19                 Plan - 09/03/19 1105    Clinical Impression Statement Pt. continues to respond well to the edema control  techniques. Pt. is  improving with his right UE functioning, and continues to try to engage it more during ADLs, and IADL tasks at home.  Pt. continues to improve with writing legibly in order to be able to complete important documents. Pt. continues to work on maintaining a mature pen grasp when writing, and increasing distal mobility needed to move the pen across the paper when formulating letters. Pt. continues to benefit from OT services to improve writing legibility, improve edema, and improve RUE functioning during ADLs, and IADLs.   Occupational performance deficits (Please refer to evaluation for details): ADL's;IADL's;Education    Body Structure / Function / Physical Skills ADL;FMC;IADL;UE functional use;Dexterity;Strength;ROM;Coordination    Rehab Potential Good    Clinical Decision Making Several treatment options, min-mod task modification necessary    Comorbidities Affecting Occupational Performance: May have comorbidities impacting occupational performance    Modification or Assistance to Complete Evaluation  Min-Moderate modification of tasks or assist with assess necessary to complete eval    OT Frequency 2x / week    OT Duration 12 weeks    OT Treatment/Interventions Self-care/ADL training;Patient/family education;Therapeutic activities;DME and/or AE instruction;Neuromuscular education;Therapeutic exercise    Consulted and Agree with Plan of Care Patient    Family Member Consulted daughter, Marcie Bal           Patient will benefit from skilled therapeutic intervention in order to improve the following deficits and impairments:   Body Structure / Function / Physical Skills: ADL, FMC, IADL, UE functional use, Dexterity, Strength, ROM, Coordination       Visit Diagnosis: Muscle weakness (generalized)  Other lack of coordination    Problem List Patient Active Problem List   Diagnosis Date Noted  . Stroke ([place]) 11/19/2018  . Syncope 10/21/2015  . Drug-induced pneumonitis 04/02/2015  . COPD  (chronic obstructive pulmonary disease) ([place]) 04/02/2015  . Dyspnea 04/02/2015  . COPD exacerbation ([place]) 03/11/2015  . CAFL (chronic airflow limitation) ([place]) 08/14/2014  . HLD (hyperlipidemia) 08/14/2014  . BP (high blood pressure) 08/14/2014  . Diabetes mellitus type 2, uncontrolled ([place]) 08/14/2014  . Chronic kidney disease (CKD), stage III (moderate) 01/27/2014  . 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 [place], [place], [address] Phone: [phone]   Fax:  [phone]  Name: HEZZIE KARIM MRN: [id number] Date of Birth: Apr 22, 1935

## 2019-09-09 ENCOUNTER — Ambulatory Visit: Payer: PPO | Attending: Orthopedic Surgery | Admitting: Occupational Therapy

## 2019-09-09 ENCOUNTER — Ambulatory Visit: Payer: PPO

## 2019-09-09 ENCOUNTER — Other Ambulatory Visit: Payer: Self-pay

## 2019-09-09 DIAGNOSIS — M6281 Muscle weakness (generalized): Secondary | ICD-10-CM | POA: Insufficient documentation

## 2019-09-09 DIAGNOSIS — R278 Other lack of coordination: Secondary | ICD-10-CM | POA: Insufficient documentation

## 2019-09-09 DIAGNOSIS — R2681 Unsteadiness on feet: Secondary | ICD-10-CM | POA: Insufficient documentation

## 2019-09-09 NOTE — Therapy (Signed)
[place] MAIN [facility] SERVICES [address] [place], [place], [address] Phone: [phone]   Fax:  [phone]  Physical Therapy Treatment  Patient Details  Name: Isaac Stevens MRN: [id number] Date of Birth: [birth year] Referring Provider (PT): Rachelle Hora, MD   Encounter Date: 09/09/2019   PT End of Session - 09/09/19 1050    Visit Number 29    Number of Visits 49    Date for PT Re-Evaluation 09/11/19    Authorization Type Pn 09/06/80, recertification 9/93/71    PT Start Time 1102    PT Stop Time 1145    PT Time Calculation (min) 43 min    Equipment Utilized During Treatment Gait belt    Activity Tolerance Patient tolerated treatment well;No increased pain;Patient limited by fatigue    Behavior During Therapy [facility] for tasks assessed/performed           Past Medical History:  Diagnosis Date  . CAFL (chronic airflow limitation) (HCC)   . CKD (chronic kidney disease)   . Diabetes mellitus without complication ([place])   . Hyperlipidemia   . Hypertension   . Rheumatoid arthritis [facility])     Past Surgical History:  Procedure Laterality Date  . TOE SURGERY     right foot    There were no vitals filed for this visit.   Subjective Assessment - 09/09/19 1048    Subjective Pt is doing good today. Nothing new to report, "everything has been okay". Denies any pain. No questions or concerns at this time.    Pertinent History Pt. is an 84y.o. male who was initially admitted to [facility] for 24 hours following a CVA on 11/19/18. Pt. was discharged, and one week later was admitted to [facility] with a CVA presenting with right sided weakness. Pt. received inpatient therapy services, and [facility] rehab services upon discharge. He is now being referred for outpatient rehab. Patient has already started outpatient OT to address RUE weakness and ADLs. 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 He has also been referred to orthopedics in January 2021 due to  RUE frozen shoulder/weakness and pain. He presents to therapy with tripod base cane which he reports using intermittently to reduce fall risk. He denies any recent falls. He denies any numbness/tingling    Limitations Standing;Walking    How long can you sit comfortably? NA    How long can you stand comfortably? 5-10 min, with fatigue    How long can you walk comfortably? 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 No/denies             TREATMENT     Ther-ex  Nustep level 3 x 5 minutes during history taking, adjusting resistance (unbilled 2 minutes) Seated LAQ with 5# AW x 10 each Seated HS curls with 5# AW x 10 each   Standing exercises with 5# AW: L UE support needed Hip flexion marches x 10 BLE, tactile cues used for R LE Hip abduction x 10 BLE; Hip extension x 10 BLE; Heel Raises x 15 BLE.     Neuromuscular Re-education  6" alternating step-ups with LUE support alternating leading LE x 10 on each side, cued to R flex hip more to stop circumduction when leading with L foot.  Agility ladder:  Forward one foot  in each box x 3 lengths of parallel bars, cued to take bigger steps and to not use UE support; Side steps x 3 lengths of parallel bars, cued picking up R foot as he drags it;  Sit to stands with foam under feet with min to no UE support x10, struggled with the last few reps, needed some UE assistance; Side steps on airex pad x 3 lengths, cued for no UE support and for slower speed; Tandem walks on airex pad x 2 lengths with SUE support; Step over foam roller with throws/catches x multiple bouts, cued for no UE support and to take bigger steps; Obstacle course with 3 foam rollers and 2 6" hurdle steps, cued for slow speed and to pick up R leg, SUE support used;    Pt educated throughout session about proper posture and  technique with exercises. Improved exercise technique, movement at target joints, use of target muscles after min to mod verbal, visual, tactile cues.    Pt was motivated and participated well within session. Session focused on a combination of strengthening and balance exercises. During step ups when leading with L foot, patient still circumducts the R hip to clear the step, but improved from last week. Verbal cues used to flex hip, knee, and ankle. Incorporated more stepping exercises to help with picking up R foot. Patient continues to utilize UE support for balance exercises, cues needed to not use UE for support. Pt continues to demonstrate decreased RLE strength compared to L side. He still requires intermittent rest breaks due to fatigue, but took less rest breaks today. He does require min VCs for proper positioning/exercise technique for optimal strengthening. Update goals and outcome measures for re-certification next visit. He would benefit from additional skilled PT intervention to improve strength, balance and mobility.       PT Short Term Goals - 07/24/19 1132      PT SHORT TERM GOAL #1   Title Patient will be adherent to HEP at least 3x a week to improve functional strength and balance for better safety at home.    Baseline 05/12/19: "I do it some of the time." 07/22/19: Partially compliant    Time 4    Period Weeks    Status Partially Met    Target Date 08/14/19      PT SHORT TERM GOAL #2   Title Patient will increase BLE gross strength to 4/5 particularly in right hip as to improve functional strength for independent gait, increased standing tolerance and increased ADL ability    Baseline see visit note 05/12/19; 07/22/19: Deferred; 07/24/19: see note    Time 4    Period Weeks    Status Partially Met    Target Date 08/14/19      PT SHORT TERM GOAL #3   Title Patient will be mod I for all bed mobility including rolling and supine to sit without difficulty to increase independence  at home.    Baseline 05/12/19: Able to roll to both directions and move from sidelying to sitting, more challenging rolling to the R side and pushing up with RUE    Time 4    Period Weeks    Status Achieved             PT Long Term Goals - 07/24/19 1132      PT LONG TERM GOAL #1   Title Patient (> 19 years old) will complete five times sit to stand test in < 15 seconds indicating an  increased LE strength and improved balance.    Baseline 05/12/19: 16.3s; 07/22/19: 13.9s    Time 8    Period Weeks    Status Achieved      PT LONG TERM GOAL #2   Title Patient will demonstrate an improved Berg Balance Score of >48/56 as to demonstrate improved balance with ADLs such as sitting/standing and transfer balance and reduced fall risk.    Baseline 05/12/19: 47/56; 07/22/19: 45/56    Time 8    Period Weeks    Status Partially Met    Target Date 09/11/19      PT LONG TERM GOAL #3   Title Patient will increase six minute walk test distance to >1000 for progression to community ambulator and improve gait ability    Baseline 05/12/19: 800'; 07/22/19: 700'    Time 8    Period Weeks    Status On-going    Target Date 09/11/19      PT LONG TERM GOAL #4   Title Patient will tolerate 5 seconds of single leg stance without loss of balance to improve ability to get in and out of shower safely.    Baseline 05/12/19: 1-2s; 07/22/19: unchanged (1-2s), more difficulty on RLE    Time 8    Period Weeks    Status On-going    Target Date 09/11/19                 Plan - 09/09/19 1053    Clinical Impression Statement Pt was motivated and participated well within session. Session focused on a combination of strengthening and balance exercises. During step ups when leading with L foot, patient still circumducts the R hip to clear the step, but improved from last week. Verbal cues used to flex hip, knee, and ankle. Incorporated more stepping exercises to help with picking up R foot. Patient continues to utilize UE  support for balance exercises, cues needed to not use UE for support. Pt continues to demonstrate decreased RLE strength compared to L side. He still requires intermittent rest breaks due to fatigue, but took less rest breaks today. He does require min VCs for proper positioning/exercise technique for optimal strengthening. Update goals and outcome measures for re-certification next visit. He would benefit from additional skilled PT intervention to improve strength, balance and mobility.    Personal Factors and Comorbidities Comorbidity 3+;Age;Time since onset of injury/illness/exacerbation    Comorbidities recent stroke, DM x2 (on medication), HTN (somewhat controlled), CKD, HLD, RA (worse in right hand)    Examination-Activity Limitations Bed Mobility;Caring for Others;Carry;Dressing;Lift;Locomotion Level;Reach Overhead;Squat;Stairs;Stand;Transfers    Examination-Participation Restrictions Church;Cleaning;Community Activity;Driving;Laundry;Meal Prep;School;Shop;Volunteer;Yard Work    Stability/Clinical Decision Making Stable/Uncomplicated    Rehab Potential Good    PT Frequency 2x / week    PT Duration 8 weeks    PT Treatment/Interventions ADLs/Self Care Home Management;Cryotherapy;Electrical Stimulation;Moist Heat;Gait training;Functional mobility Scientist, forensic;Therapeutic activities;Therapeutic exercise;Balance training;Neuromuscular re-education;Patient/family education;Orthotic Fit/Training;Energy conservation    PT Next Visit Plan Continue with progression in balance training, strength and conditioning    PT Home Exercise Plan Medbridge Access Code: [email or web address]    Consulted and Agree with Plan of Care Patient           Patient will benefit from skilled therapeutic intervention in order to improve the following deficits and impairments:  Abnormal gait, Decreased balance, Decreased endurance, Decreased mobility, Difficulty walking, Impaired perceived functional ability, Cardiopulmonary  status limiting activity, Decreased activity tolerance, Decreased safety awareness, Decreased strength  Visit Diagnosis: Muscle weakness (generalized)  Other  lack of coordination  Unsteadiness on feet     Problem List Patient Active Problem List   Diagnosis Date Noted  . Stroke ([place]) 11/19/2018  . Syncope 10/21/2015  . Drug-induced pneumonitis 04/02/2015  . COPD (chronic obstructive pulmonary disease) ([place]) 04/02/2015  . Dyspnea 04/02/2015  . COPD exacerbation ([place]) 03/11/2015  . CAFL (chronic airflow limitation) ([place]) 08/14/2014  . HLD (hyperlipidemia) 08/14/2014  . BP (high blood pressure) 08/14/2014  . Diabetes mellitus type 2, uncontrolled ([place]) 08/14/2014  . Chronic kidney disease (CKD), stage III (moderate) 01/27/2014  . 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 I have personally read, edited and approve of the note as written.   Noemi Chapel, SPT Phillips Grout PT, DPT, GCS  Huprich,Jason 09/09/2019, 2:28 PM  [place] MAIN [facility] SERVICES [address] [place], [place], [address] Phone: [phone]   Fax:  [phone]  Name: Isaac Stevens MRN: [id number] Date of Birth: 1935/06/15

## 2019-09-10 ENCOUNTER — Encounter: Payer: Self-pay | Admitting: Occupational Therapy

## 2019-09-10 NOTE — Therapy (Signed)
[place] MAIN [facility] SERVICES [address] [place], [place], [address] Phone: [phone]   Fax:  [phone]  Occupational Therapy Treatment  Patient Details  Name: BRADSHAW MINIHAN MRN: [id number] Date of Birth: 03-Apr-1935 Referring Provider (OT): Dorise Hiss, [place]   Encounter Date: 09/09/2019   OT End of Session - 09/10/19 1014    Visit Number 35    Number of Visits 15    Date for OT Re-Evaluation 11/10/19    Authorization Type FOTO   Progress report period starting 02/20/2019    OT Start Time 1023    OT Stop Time 1102    OT Time Calculation (min) 39 min    Equipment Utilized During Treatment geometric puzzle    Activity Tolerance Patient tolerated treatment well    Behavior During Therapy [facility] for tasks assessed/performed           Past Medical History:  Diagnosis Date  . CAFL (chronic airflow limitation) (HCC)   . CKD (chronic kidney disease)   . Diabetes mellitus without complication ([place])   . Hyperlipidemia   . Hypertension   . Rheumatoid arthritis [facility])     Past Surgical History:  Procedure Laterality Date  . TOE SURGERY     right foot    There were no vitals filed for this visit.   Subjective Assessment - 09/10/19 1005    Subjective  Pt reports feeling good, but does endorse minimal sleep last night stating that he didn't get to bed until around 5AM d/t "thoughts running through my head".    Patient is accompanied by: Family member    Pertinent History Pt. is an 84 y.o. male who was initially admitted to [facility] for 24 hours following a CVA. Pt. was discharged, and one week later was admitted to [facility] with a CVA presenting with right sidedl weakness. Pt. received inpatient therapy services, and HHOT services upon discharge. Pt. is now ready for outpatient OT services.    Special Tests FOTO score: 43    Patient Stated Goals To be able to do more for himself and not rely on his wife's help as much.    Currently in Pain?  No/denies          Neuro Re-Ed OT facilitates pt participation in multiple coordination tasks including fine and gross motor skills as well as ROM of R UE. OT encourages gentle R UE shld AAROM in all available planes as tolerable, while pt in seated position. Pt, with great effort, completes <1/2 range shld flexion I'ly and requires MIN A to gain ~15 degrees. OT engages pt in use of geometric puzzle to improve gross motor skills including ability to pronate/supinate in conjunction with wrist extension to direct a disc along a track. Pt 90% successful with this task, requiring increased time and ~10% verbal cue and one tactile cue. Lastly, OT engages pt in in-hand manipulation tasks with cards to improve rotation and pinch skills. Overall, pt tolerates well. Does express some frustration briefly with geometric puzzle task, but ultimately participates appropriately and OT encourages breaks as needed when feeling frustrated.       Manual Pt tolerated retrograde massage to the right hand for edema control. Pt. edema circumferential measurements after manual techniques it improve to wrist: 18.5cm, Metacarpals: 23cm. Pt Tolerated soft tissue mobilizations for metacarpal spread stretches. Pt education was provided about retrograde massage for edema control.    Pt continues to respond well to the edema control techniques.Pt. is improving with  his right UE functioning, and reports continuing to try to engage it more during ADLs, and IADL tasks at home.           OT Education - 09/10/19 1013    Education Details RUE ROM and [facility] skills practice that can be carried over at home    Person(s) Educated Patient    Methods Explanation;Demonstration    Comprehension Verbalized understanding;Returned demonstration               OT Long Term Goals - 08/18/19 1131      OT LONG TERM GOAL #1   Title Pt. will increase Right shoulder AROM  in order to be able to reach into a cabinet.    Baseline  20 degrees of isolated flexion. Pt. has difficulty reaching up into cabinets.    Time 12    Period Weeks    Status On-going    Target Date 11/10/19      OT LONG TERM GOAL #2   Title Pt. will increase right shoulder abduction to be able to independently wash his hair.    Baseline Pt. continues to present with limited abduction, and is unable to reach up to his head to wash his hair.    Time 12    Period Weeks    Status On-going    Target Date 11/10/19      OT LONG TERM GOAL #3   Title Pt. will perform self-feeding with his right hand with minA.    Baseline Pt. continues to require modA, and is able to assist with using a fork with the right hand  when eating an eggs.    Time 12    Period Weeks    Status On-going    Target Date 10/11/19      OT LONG TERM GOAL #4   Title Pt. will increase right wrist extension by 10 degrees in preparation for reaching for a glass.    Baseline Pt. is improving with holding a glass, however is not able to bring it to his mouth    Time 12    Period Weeks    Target Date 10/11/19      OT LONG TERM GOAL #5   Title Pt. will independently use his right hand to wash his face.    Baseline Pt. is now able to reach his face with his right hand. Pt.is unable to thoroughy  washing motion.    Time 12    Period Weeks    Status On-going    Target Date 10/11/19      Long Term Additional Goals   Additional Long Term Goals Yes      OT LONG TERM GOAL #6   Title Pt. will be independent with UE dressing    Baseline Pt. is independent with increased time. Assist with buttons only.    Time 12    Period Weeks    Status On-going    Target Date 11/10/19      OT LONG TERM GOAL #7   Title Pt. will require minA LE dressing    Baseline Pt is independent donning socks. Pt. requires modA with pants, and shoes with increased time    Time 12    Period Weeks    Status On-going    Target Date 11/10/19      OT LONG TERM GOAL #8   Title Pt. will improve FOT score by 2  points for improvied UE functioning    Baseline 08/18/2019: Current FOTO  score:50    Time 12    Period Weeks    Status New    Target Date 11/10/19                 Plan - 09/10/19 1015    Clinical Impression Statement Pt seen for OT treatment this date to f/u re: [facility] skills, FM strength, and ROM of RUE as well as general functioning as it pertains to completion of ADLs. Pt continues to have difficulties with R UE ROM and Goodman. Pt noted to have some swelling of R UE for which retrograde massage performed to promote edema reduction. In addition, pt engaged in different coordination tasks to improve wrist extension/supination to manipulate ADL items. In addition, OT addresses in-hand manipulation including translation and rotation of I/ADL manipulatives such as coins.    OT Occupational Profile and History Detailed Assessment- Review of Records and additional review of physical, cognitive, psychosocial history related to current functional performance    Occupational performance deficits (Please refer to evaluation for details): ADL's;IADL's;Education    Body Structure / Function / Physical Skills ADL;FMC;IADL;UE functional use;Dexterity;Strength;ROM;Coordination    Clinical Decision Making Several treatment options, min-mod task modification necessary    Comorbidities Affecting Occupational Performance: May have comorbidities impacting occupational performance    Modification or Assistance to Complete Evaluation  Min-Moderate modification of tasks or assist with assess necessary to complete eval    OT Frequency 2x / week    OT Duration 12 weeks    OT Treatment/Interventions Self-care/ADL training;Patient/family education;Therapeutic activities;DME and/or AE instruction;Neuromuscular education;Therapeutic exercise    Plan to continue to address coordination skills and R UE ROM to improve pt level of Indep with HH I/ADLs.    OT Home Exercise Plan OT encourages pt work on rotation/translation at  home to better manipulate slender/thin objects.    Consulted and Agree with Plan of Care Patient    Family Member Consulted daughter, Marcie Bal           Patient will benefit from skilled therapeutic intervention in order to improve the following deficits and impairments:   Body Structure / Function / Physical Skills: ADL, FMC, IADL, UE functional use, Dexterity, Strength, ROM, Coordination       Visit Diagnosis: Muscle weakness (generalized)  Other lack of coordination    Problem List Patient Active Problem List   Diagnosis Date Noted  . Stroke ([place]) 11/19/2018  . Syncope 10/21/2015  . Drug-induced pneumonitis 04/02/2015  . COPD (chronic obstructive pulmonary disease) ([place]) 04/02/2015  . Dyspnea 04/02/2015  . COPD exacerbation ([place]) 03/11/2015  . CAFL (chronic airflow limitation) ([place]) 08/14/2014  . HLD (hyperlipidemia) 08/14/2014  . BP (high blood pressure) 08/14/2014  . Diabetes mellitus type 2, uncontrolled ([place]) 08/14/2014  . Chronic kidney disease (CKD), stage III (moderate) 01/27/2014  . 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 [place], [place], [address] Phone: [phone]   Fax:  [phone]  Name: VICTORIANO CAMPION MRN: [id number] Date of Birth: 1935-08-31

## 2019-09-11 ENCOUNTER — Other Ambulatory Visit: Payer: Self-pay

## 2019-09-11 ENCOUNTER — Ambulatory Visit: Payer: PPO

## 2019-09-11 DIAGNOSIS — R2681 Unsteadiness on feet: Secondary | ICD-10-CM

## 2019-09-11 DIAGNOSIS — M6281 Muscle weakness (generalized): Secondary | ICD-10-CM

## 2019-09-11 DIAGNOSIS — R278 Other lack of coordination: Secondary | ICD-10-CM

## 2019-09-11 NOTE — Therapy (Signed)
[place] MAIN [facility] SERVICES [address] [place], [place], [address] Phone: [phone]   Fax:  [phone]  Physical Therapy Treatment / Discharge  Patient Details  Name: Isaac Stevens MRN: [id number] Date of Birth: 08-13-1935 Referring Provider (PT): Rachelle Hora, MD   Encounter Date: 09/11/2019   PT End of Session - 09/11/19 1023    Visit Number 30    Number of Visits 49    Date for PT Re-Evaluation 09/11/19    Authorization Type Pn 03/10/61, recertification 3/35/45; discharge: 09/11/19    PT Start Time 1018    PT Stop Time 1100    PT Time Calculation (min) 42 min    Equipment Utilized During Treatment Gait belt    Activity Tolerance Patient tolerated treatment well;No increased pain;Patient limited by fatigue    Behavior During Therapy [facility] for tasks assessed/performed           Past Medical History:  Diagnosis Date  . CAFL (chronic airflow limitation) (HCC)   . CKD (chronic kidney disease)   . Diabetes mellitus without complication ([place])   . Hyperlipidemia   . Hypertension   . Rheumatoid arthritis [facility])     Past Surgical History:  Procedure Laterality Date  . TOE SURGERY     right foot    There were no vitals filed for this visit.   Subjective Assessment - 09/11/19 1023    Subjective Pt is doing good today. Nothing new to report. Denies any pain. No questions or concerns at this time.    Pertinent History Pt. is an 84y.o. male who was initially admitted to [facility] for 24 hours following a CVA on 11/19/18. Pt. was discharged, and one week later was admitted to [facility] with a CVA presenting with right sided weakness. Pt. received inpatient therapy services, and [facility] rehab services upon discharge. He is now being referred for outpatient rehab. Patient has already started outpatient OT to address RUE weakness and ADLs. 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 He has also been referred to orthopedics in January 2021 due  to RUE frozen shoulder/weakness and pain. He presents to therapy with tripod base cane which he reports using intermittently to reduce fall risk. He denies any recent falls. He denies any numbness/tingling    Limitations Standing;Walking    How long can you sit comfortably? NA    How long can you stand comfortably? 5-10 min, with fatigue    How long can you walk comfortably? 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 No/denies            TREATMENT  [facility] PT Assessment - 09/11/19 1413      Assessment   Medical Diagnosis CVA    Referring Provider (PT) Rachelle Hora, MD    Onset Date/Surgical Date 11/19/18    Hand Dominance Right      Strength   Strength Assessment Site Hip    Right/Left Hip Right;Left    Right Hip Flexion 4-/5    Right Hip ABduction 4/5    Right Hip ADduction 4+/5    Left Hip Flexion 5/5      6 Minute Walk- Baseline   6 Minute Walk- Baseline yes    BP (mmHg) 153/73    HR (bpm) 80    02  Sat (%RA) 100 %      6 Minute walk- Post Test   6 Minute Walk Post Test yes    BP (mmHg) 158/72    HR (bpm) 92    02 Sat (%RA) 86 %    Modified Borg Scale for Dyspnea --   patient has difficulty assesing     6 minute walk test results    Aerobic Endurance Distance Walked 710    Endurance additional comments with tripod base cane, CGA for safety, less than 1000 feet is limited Community education officer   Sit to Stand Able to stand without using hands and stabilize independently    Standing Unsupported Able to stand safely 2 minutes    Sitting with Back Unsupported but Feet Supported on Floor or Stool Able to sit safely and securely 2 minutes    Stand to Sit Sits safely with minimal use of hands    Transfers Able to transfer safely, minor use of hands    Standing Unsupported with Eyes Closed Able to stand 10  seconds safely    Standing Unsupported with Feet Together Able to place feet together independently and stand 1 minute safely    From Standing, Reach Forward with Outstretched Arm Can reach forward >12 cm safely (5")    From Standing Position, Pick up Object from Floor Able to pick up shoe, needs supervision    From Standing Position, Turn to Look Behind Over each Shoulder Turn sideways only but maintains balance    Turn 360 Degrees Able to turn 360 degrees safely but slowly    Standing Unsupported, Alternately Place Feet on Step/Stool Able to stand independently and complete 8 steps >20 seconds    Standing Unsupported, One Foot in Front Able to take small step independently and hold 30 seconds    Standing on One Leg Tries to lift leg/unable to hold 3 seconds but remains standing independently    Total Score 44              TREATMENT  [facility]-ex  Updated outcome measures with patient including: 5TSTS: 14.47s BERG: 44/56 6MWT: 50' with CGA and tripod base cane R hip strength to 4/5: Hip flexion: 4-/5 (L hip flexion 5/5) Hip extension: did not test due to patient not comfortable in prone position Hip abduction: 4/5 Hip adduction: 4+/5 Single leg balance: 1-2s on both sides, more limited on RLE; Reviewed HEP with patient;    Patient motivated and participated well within session. Outcome measures and goals updated today. 5TSTS increased to 14.47 seconds which is worse than last time it was updated at 13.9s. BERG dropped to 44/56 from last time is was updated however still better than it was at initial evaluation. 6MWT increased to 710' from 700' at last updated. His single leg balance remains limited, especially on his RLE. Patient states that he has improved at least 50% from initial evaluation as he has gotten stronger, but is "hard to put a number on it." Updated HEP and encouraged patient to perform his home program. Patient notes he will use his bike at home and do his exercises.  Patient will be discharged at this time, but encouraged to talk to his doctor about to returning to PT if he starts getting worse.        PT Short Term Goals - 09/11/19 1402      PT SHORT TERM GOAL #1   Title Patient will be adherent to HEP at  least 3x a week to improve functional strength and balance for better safety at home.    Baseline 05/12/19: "I do it some of the time." 07/22/19: Partially compliant; 09/11/19: Partially compliant    Time 4    Period Weeks    Status Partially Met    Target Date 08/14/19      PT SHORT TERM GOAL #2   Title Patient will increase BLE gross strength to 4/5 particularly in right hip as to improve functional strength for independent gait, increased standing tolerance and increased ADL ability    Baseline see visit note 05/12/19; 07/22/19: Deferred; 07/24/19: see note; 09/11/19: R flexion: 4-/5, L flexion 5/5; R abduction 4/5, R hip adduction 4+/5    Time 4    Period Weeks    Status Partially Met    Target Date 08/14/19      PT SHORT TERM GOAL #3   Title Patient will be mod I for all bed mobility including rolling and supine to sit without difficulty to increase independence at home.    Baseline 05/12/19: Able to roll to both directions and move from sidelying to sitting, more challenging rolling to the R side and pushing up with RUE    Time 4    Period Weeks    Status Achieved             PT Long Term Goals - 09/11/19 1407      PT LONG TERM GOAL #1   Title Patient (> 2 years old) will complete five times sit to stand test in < 15 seconds indicating an increased LE strength and improved balance.    Baseline 05/12/19: 16.3s; 07/22/19: 13.9s    Time 8    Period Weeks    Status Achieved      PT LONG TERM GOAL #2   Title Patient will demonstrate an improved Berg Balance Score of >48/56 as to demonstrate improved balance with ADLs such as sitting/standing and transfer balance and reduced fall risk.    Baseline 05/12/19: 47/56; 07/22/19: 45/56; 09/11/19: 44/56      Time 8    Period Weeks    Status Partially Met      PT LONG TERM GOAL #3   Title Patient will increase six minute walk test distance to >1000 for progression to community ambulator and improve gait ability    Baseline 05/12/19: 800'; 07/22/19: 700'; 09/11/19: 710'    Time 8    Period Weeks    Status On-going      PT LONG TERM GOAL #4   Title Patient will tolerate 5 seconds of single leg stance without loss of balance to improve ability to get in and out of shower safely.    Baseline 05/12/19: 1-2s; 07/22/19: unchanged (1-2s), more difficulty on RLE; 09/11/19: unchanged (1-2s), more difficulty on RLE    Time 8    Period Weeks    Status On-going                 Plan - 09/11/19 1024    Clinical Impression Statement Patient motivated and participated well within session. Outcome measures and goals updated today. 5TSTS increased to 14.47 seconds which is worse than last time it was updated at 13.9s. BERG dropped to 44/56 from last time is was updated however still better than it was at initial evaluation. 6MWT increased to 710' from 700' at last updated. His single leg balance remains limited, especially on his RLE. Patient states that he has improved at  least 50% from initial evaluation as he has gotten stronger, but is "hard to put a number on it." Updated HEP and encouraged patient to perform his home program. Patient notes he will use his bike at home and do his exercises. Patient will be discharged at this time, but encouraged to talk to his doctor about to returning to PT if he starts getting worse.    Personal Factors and Comorbidities Comorbidity 3+;Age;Time since onset of injury/illness/exacerbation    Comorbidities recent stroke, DM x2 (on medication), HTN (somewhat controlled), CKD, HLD, RA (worse in right hand)    Examination-Activity Limitations Bed Mobility;Caring for Others;Carry;Dressing;Lift;Locomotion Level;Reach Overhead;Squat;Stairs;Stand;Transfers    Examination-Participation  Restrictions Church;Cleaning;Community Activity;Driving;Laundry;Meal Prep;School;Shop;Volunteer;Yard Work    Stability/Clinical Decision Making Stable/Uncomplicated    Rehab Potential Good    PT Frequency 2x / week    PT Duration 8 weeks    PT Treatment/Interventions ADLs/Self Care Home Management;Cryotherapy;Electrical Stimulation;Moist Heat;Gait training;Functional mobility Scientist, forensic;Therapeutic activities;Therapeutic exercise;Balance training;Neuromuscular re-education;Patient/family education;Orthotic Fit/Training;Energy conservation    PT Next Visit Plan Discharged today.    PT Home Exercise Plan Medbridge Access Code: [email or web address]    Consulted and Agree with Plan of Care Patient           Patient will benefit from skilled therapeutic intervention in order to improve the following deficits and impairments:  Abnormal gait, Decreased balance, Decreased endurance, Decreased mobility, Difficulty walking, Impaired perceived functional ability, Cardiopulmonary status limiting activity, Decreased activity tolerance, Decreased safety awareness, Decreased strength  Visit Diagnosis: Muscle weakness (generalized)  Other lack of coordination  Unsteadiness on feet     Problem List Patient Active Problem List   Diagnosis Date Noted  . Stroke ([place]) 11/19/2018  . Syncope 10/21/2015  . Drug-induced pneumonitis 04/02/2015  . COPD (chronic obstructive pulmonary disease) ([place]) 04/02/2015  . Dyspnea 04/02/2015  . COPD exacerbation ([place]) 03/11/2015  . CAFL (chronic airflow limitation) ([place]) 08/14/2014  . HLD (hyperlipidemia) 08/14/2014  . BP (high blood pressure) 08/14/2014  . Diabetes mellitus type 2, uncontrolled ([place]) 08/14/2014  . Chronic kidney disease (CKD), stage III (moderate) 01/27/2014  . 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 I have personally  read, edited and approve of the note as written.   Noemi Chapel, SPT Phillips Grout PT, DPT, GCS  Huprich,Jason 09/11/2019, 5:32 PM  [place] MAIN [facility] SERVICES [address] [place], [place], [address] Phone: [phone]   Fax:  [phone]  Name: Isaac Stevens MRN: [id number] Date of Birth: 1935/03/24

## 2019-09-11 NOTE — Patient Instructions (Signed)
Access Code: [email or web address] URL: [email or web address] Date: 09/11/2019 Prepared by: Roxana Hires  Exercises Sit to Stand without Arm Support - 2 x daily - 7 x weekly - 2 sets - 10 reps Standing March with Counter Support - 2 x daily - 7 x weekly - 2 sets - 10 reps - 2s hold Heel rises with counter support - 2 x daily - 7 x weekly - 2 sets - 10 reps - 2s hold Standing Hip Extension with Counter Support - 2 x daily - 7 x weekly - 2 sets - 10 reps - 2s hold Standing Hip Abduction with Counter Support - 2 x daily - 7 x weekly - 2 sets - 10 reps - 2s hold Standing Romberg to 1/2 Tandem Stance - 2 x daily - 7 x weekly - 2 sets - 30s hold

## 2019-09-15 DIAGNOSIS — M1A00X0 Idiopathic chronic gout, unspecified site, without tophus (tophi): Secondary | ICD-10-CM | POA: Diagnosis not present

## 2019-09-15 DIAGNOSIS — M0579 Rheumatoid arthritis with rheumatoid factor of multiple sites without organ or systems involvement: Secondary | ICD-10-CM | POA: Diagnosis not present

## 2019-09-15 DIAGNOSIS — I639 Cerebral infarction, unspecified: Secondary | ICD-10-CM | POA: Diagnosis not present

## 2019-09-16 ENCOUNTER — Ambulatory Visit: Payer: PPO

## 2019-09-16 ENCOUNTER — Ambulatory Visit: Payer: PPO | Admitting: Occupational Therapy

## 2019-09-18 ENCOUNTER — Ambulatory Visit: Payer: PPO

## 2019-09-23 ENCOUNTER — Ambulatory Visit: Payer: PPO

## 2019-09-23 ENCOUNTER — Ambulatory Visit: Payer: PPO | Admitting: Occupational Therapy

## 2019-09-25 ENCOUNTER — Ambulatory Visit: Payer: PPO

## 2019-09-30 ENCOUNTER — Ambulatory Visit: Payer: PPO | Admitting: Occupational Therapy

## 2019-09-30 ENCOUNTER — Ambulatory Visit: Payer: PPO

## 2019-10-02 ENCOUNTER — Ambulatory Visit: Payer: PPO

## 2019-10-07 ENCOUNTER — Ambulatory Visit: Payer: PPO | Admitting: Occupational Therapy

## 2019-10-07 ENCOUNTER — Ambulatory Visit: Payer: PPO

## 2019-10-09 ENCOUNTER — Ambulatory Visit: Payer: PPO

## 2019-10-14 ENCOUNTER — Ambulatory Visit: Payer: PPO

## 2019-10-14 ENCOUNTER — Ambulatory Visit: Payer: PPO | Attending: Orthopedic Surgery | Admitting: Occupational Therapy

## 2019-10-16 ENCOUNTER — Ambulatory Visit: Payer: PPO | Admitting: Occupational Therapy

## 2019-10-16 ENCOUNTER — Encounter: Payer: Self-pay | Admitting: Occupational Therapy

## 2019-10-16 ENCOUNTER — Ambulatory Visit: Payer: PPO

## 2019-10-16 DIAGNOSIS — M6281 Muscle weakness (generalized): Secondary | ICD-10-CM

## 2019-10-16 NOTE — Therapy (Unsigned)
[place] MAIN [facility] SERVICES [address] [place], [place], [address] Phone: [phone]   Fax:  [phone]  October 16, 2019    No Recipients  Occupational Therapy Discharge Summary   Patient: Isaac Stevens MRN: [id number] Date of Birth: [birth year]  Diagnosis: Muscle weakness (generalized)  Referring Provider (OT): Dorise Hiss, [place]   The above patient had been seen in Occupational Therapy 35 times.  The treatment consisted of ADL training, UE there. Ex, neuromuscular re-ed, manual therapy, edema control, and pt/caregiver ed. The patient is: improved  Subjective: Pt. has not returned to the clinic since finishing with PT. Pt.'s wife reports that he thought that he had finished with all therapies when he finished with PT. Pt. Continues to work with his RUE at home, engaging it during ADL, and IADL tasks.  Pt. Continues to present   Functional Status at Discharge:   OT Long Term Goals - 08/18/19 1131      OT LONG TERM GOAL #1   Title Pt. will increase Right shoulder AROM  in order to be able to reach into a cabinet.    Baseline 20 degrees of isolated flexion. Pt. has difficulty reaching up into cabinets.    Time 12    Period Weeks    Status Not met   Target Date 11/10/19      OT LONG TERM GOAL #2   Title Pt. will increase right shoulder abduction to be able to independently wash his hair.    Baseline Pt. continues to present with limited abduction, and is unable to reach up to his head to wash his hair.    Time 12    Period Weeks    Status Not met   Target Date 11/10/19      OT LONG TERM GOAL #3   Title Pt. will perform self-feeding with his right hand with minA.    Baseline Pt. is able to assist with using his right hand  when eating an eggs.    Time 12    Period Weeks    Status Met   Target Date 10/11/19      OT LONG TERM GOAL #4   Title Pt. will increase right wrist extension by 10 degrees in preparation for  reaching for a glass.    Baseline Pt. is improving with holding a glass, however is not able to bring it to his mouth    Time 12    Period Weeks    Target Date 10/11/19      OT LONG TERM GOAL #5   Title Pt. will independently use his right hand to wash his face.    Baseline Pt. is now able to reach his face with his right hand. Pt.is unable to thoroughy  washing motion.    Time 12    Period Weeks    Status Partially met   Target Date 10/11/19      Long Term Additional Goals   Additional Long Term Goals Yes      OT LONG TERM GOAL #6   Title Pt. will be independent with UE dressing    Baseline Pt. is independent with increased time. Assist with buttons only.    Time 12    Period Weeks    Status Partially met   Target Date 11/10/19      OT LONG TERM GOAL #7   Title Pt. will require minA LE dressing    Baseline Pt is independent donning socks. Pt.  requires modA with pants, and shoes with increased time    Time 12    Period Weeks    Status Partially met   Target Date 11/10/19      OT LONG TERM GOAL #8   Title Pt. will improve FOT score by 2 points for improvied UE functioning    Baseline 08/18/2019: Current FOTO score:50    Time 12    Period Weeks    Status Partially met   Target Date 11/10/19             Sincerely,  Harrel Carina, OT  CC No Recipients  [place] MA[facility] SERVICES [address] [place], [place], [address] Phone: [phone]   Fax:  [phone]  Patient: DRUE HARR MRN: [id number] Date of Birth: 05/10/1935

## 2019-10-21 ENCOUNTER — Ambulatory Visit: Payer: PPO

## 2019-10-21 ENCOUNTER — Ambulatory Visit: Payer: PPO | Admitting: Occupational Therapy

## 2019-10-23 ENCOUNTER — Ambulatory Visit: Payer: PPO | Admitting: Occupational Therapy

## 2019-10-23 ENCOUNTER — Ambulatory Visit: Payer: PPO

## 2019-10-28 ENCOUNTER — Ambulatory Visit: Payer: PPO | Admitting: Occupational Therapy

## 2019-10-28 ENCOUNTER — Ambulatory Visit: Payer: PPO

## 2019-10-30 ENCOUNTER — Ambulatory Visit: Payer: PPO | Admitting: Occupational Therapy

## 2019-10-30 ENCOUNTER — Ambulatory Visit: Payer: PPO

## 2019-11-04 ENCOUNTER — Ambulatory Visit: Payer: PPO | Admitting: Occupational Therapy

## 2019-11-04 ENCOUNTER — Ambulatory Visit: Payer: PPO

## 2019-11-06 ENCOUNTER — Ambulatory Visit: Payer: PPO | Admitting: Occupational Therapy

## 2019-11-06 ENCOUNTER — Ambulatory Visit: Payer: PPO

## 2019-12-22 DIAGNOSIS — Z8673 Personal history of transient ischemic attack (TIA), and cerebral infarction without residual deficits: Secondary | ICD-10-CM | POA: Diagnosis not present

## 2019-12-22 DIAGNOSIS — J449 Chronic obstructive pulmonary disease, unspecified: Secondary | ICD-10-CM | POA: Diagnosis not present

## 2019-12-22 DIAGNOSIS — R42 Dizziness and giddiness: Secondary | ICD-10-CM | POA: Diagnosis not present

## 2019-12-22 DIAGNOSIS — N1831 Chronic kidney disease, stage 3a: Secondary | ICD-10-CM | POA: Diagnosis not present

## 2019-12-22 DIAGNOSIS — I10 Essential (primary) hypertension: Secondary | ICD-10-CM | POA: Diagnosis not present

## 2020-01-22 DIAGNOSIS — E1165 Type 2 diabetes mellitus with hyperglycemia: Secondary | ICD-10-CM | POA: Diagnosis not present

## 2020-01-22 DIAGNOSIS — E7849 Other hyperlipidemia: Secondary | ICD-10-CM | POA: Diagnosis not present

## 2020-01-22 DIAGNOSIS — D692 Other nonthrombocytopenic purpura: Secondary | ICD-10-CM | POA: Diagnosis not present

## 2020-01-22 DIAGNOSIS — I10 Essential (primary) hypertension: Secondary | ICD-10-CM | POA: Diagnosis not present

## 2020-01-29 DIAGNOSIS — E1165 Type 2 diabetes mellitus with hyperglycemia: Secondary | ICD-10-CM | POA: Diagnosis not present

## 2020-01-29 DIAGNOSIS — N1831 Chronic kidney disease, stage 3a: Secondary | ICD-10-CM | POA: Diagnosis not present

## 2020-01-29 DIAGNOSIS — I69351 Hemiplegia and hemiparesis following cerebral infarction affecting right dominant side: Secondary | ICD-10-CM | POA: Diagnosis not present

## 2020-01-29 DIAGNOSIS — E538 Deficiency of other specified B group vitamins: Secondary | ICD-10-CM | POA: Diagnosis not present

## 2020-01-29 DIAGNOSIS — D692 Other nonthrombocytopenic purpura: Secondary | ICD-10-CM | POA: Diagnosis not present

## 2020-01-29 DIAGNOSIS — E118 Type 2 diabetes mellitus with unspecified complications: Secondary | ICD-10-CM | POA: Diagnosis not present

## 2020-01-29 DIAGNOSIS — M1A00X0 Idiopathic chronic gout, unspecified site, without tophus (tophi): Secondary | ICD-10-CM | POA: Diagnosis not present

## 2020-01-29 DIAGNOSIS — J449 Chronic obstructive pulmonary disease, unspecified: Secondary | ICD-10-CM | POA: Diagnosis not present

## 2020-01-29 DIAGNOSIS — I10 Essential (primary) hypertension: Secondary | ICD-10-CM | POA: Diagnosis not present

## 2020-01-29 DIAGNOSIS — M064 Inflammatory polyarthropathy: Secondary | ICD-10-CM | POA: Diagnosis not present

## 2020-01-29 DIAGNOSIS — E7849 Other hyperlipidemia: Secondary | ICD-10-CM | POA: Diagnosis not present

## 2020-03-01 ENCOUNTER — Emergency Department: Payer: PPO

## 2020-03-01 ENCOUNTER — Inpatient Hospital Stay
Admit: 2020-03-01 | Discharge: 2020-03-01 | Disposition: A | Discharge status: Home / Self Care | Payer: PPO | Attending: Internal Medicine | Admitting: Internal Medicine

## 2020-03-01 ENCOUNTER — Encounter: Payer: Self-pay | Admitting: Medical Oncology

## 2020-03-01 ENCOUNTER — Inpatient Hospital Stay: Payer: PPO

## 2020-03-01 ENCOUNTER — Inpatient Hospital Stay
Admission: EM | Admit: 2020-03-01 | Discharge: 2020-03-04 | DRG: 177 | Discharge status: Against Medical Advice | Payer: PPO | Attending: Internal Medicine | Admitting: Internal Medicine

## 2020-03-01 ENCOUNTER — Other Ambulatory Visit: Payer: Self-pay

## 2020-03-01 DIAGNOSIS — J9811 Atelectasis: Secondary | ICD-10-CM | POA: Diagnosis present

## 2020-03-01 DIAGNOSIS — Z7984 Long term (current) use of oral hypoglycemic drugs: Secondary | ICD-10-CM | POA: Diagnosis not present

## 2020-03-01 DIAGNOSIS — E669 Obesity, unspecified: Secondary | ICD-10-CM | POA: Diagnosis present

## 2020-03-01 DIAGNOSIS — I10 Essential (primary) hypertension: Secondary | ICD-10-CM | POA: Diagnosis present

## 2020-03-01 DIAGNOSIS — R069 Unspecified abnormalities of breathing: Secondary | ICD-10-CM | POA: Diagnosis not present

## 2020-03-01 DIAGNOSIS — M069 Rheumatoid arthritis, unspecified: Secondary | ICD-10-CM | POA: Diagnosis not present

## 2020-03-01 DIAGNOSIS — U071 COVID-19: Secondary | ICD-10-CM | POA: Diagnosis not present

## 2020-03-01 DIAGNOSIS — J9601 Acute respiratory failure with hypoxia: Secondary | ICD-10-CM | POA: Diagnosis not present

## 2020-03-01 DIAGNOSIS — J1282 Pneumonia due to coronavirus disease 2019: Secondary | ICD-10-CM | POA: Diagnosis present

## 2020-03-01 DIAGNOSIS — I130 Hypertensive heart and chronic kidney disease with heart failure and stage 1 through stage 4 chronic kidney disease, or unspecified chronic kidney disease: Secondary | ICD-10-CM | POA: Diagnosis present

## 2020-03-01 DIAGNOSIS — N1831 Chronic kidney disease, stage 3a: Secondary | ICD-10-CM | POA: Diagnosis present

## 2020-03-01 DIAGNOSIS — Z79899 Other long term (current) drug therapy: Secondary | ICD-10-CM | POA: Diagnosis not present

## 2020-03-01 DIAGNOSIS — E785 Hyperlipidemia, unspecified: Secondary | ICD-10-CM | POA: Diagnosis present

## 2020-03-01 DIAGNOSIS — R0602 Shortness of breath: Secondary | ICD-10-CM | POA: Diagnosis not present

## 2020-03-01 DIAGNOSIS — E1122 Type 2 diabetes mellitus with diabetic chronic kidney disease: Secondary | ICD-10-CM | POA: Diagnosis not present

## 2020-03-01 DIAGNOSIS — Z66 Do not resuscitate: Secondary | ICD-10-CM | POA: Diagnosis not present

## 2020-03-01 DIAGNOSIS — Z7982 Long term (current) use of aspirin: Secondary | ICD-10-CM

## 2020-03-01 DIAGNOSIS — I5031 Acute diastolic (congestive) heart failure: Secondary | ICD-10-CM | POA: Diagnosis not present

## 2020-03-01 DIAGNOSIS — N183 Chronic kidney disease, stage 3 unspecified: Secondary | ICD-10-CM

## 2020-03-01 DIAGNOSIS — I5032 Chronic diastolic (congestive) heart failure: Secondary | ICD-10-CM | POA: Diagnosis present

## 2020-03-01 DIAGNOSIS — J440 Chronic obstructive pulmonary disease with acute lower respiratory infection: Secondary | ICD-10-CM | POA: Diagnosis not present

## 2020-03-01 DIAGNOSIS — R7989 Other specified abnormal findings of blood chemistry: Secondary | ICD-10-CM | POA: Diagnosis not present

## 2020-03-01 DIAGNOSIS — Z7189 Other specified counseling: Secondary | ICD-10-CM | POA: Diagnosis not present

## 2020-03-01 DIAGNOSIS — R0689 Other abnormalities of breathing: Secondary | ICD-10-CM | POA: Diagnosis not present

## 2020-03-01 DIAGNOSIS — Z6833 Body mass index (BMI) 33.0-33.9, adult: Secondary | ICD-10-CM

## 2020-03-01 DIAGNOSIS — Z888 Allergy status to other drugs, medicaments and biological substances status: Secondary | ICD-10-CM

## 2020-03-01 DIAGNOSIS — J189 Pneumonia, unspecified organism: Secondary | ICD-10-CM | POA: Diagnosis not present

## 2020-03-01 DIAGNOSIS — J9600 Acute respiratory failure, unspecified whether with hypoxia or hypercapnia: Secondary | ICD-10-CM | POA: Diagnosis not present

## 2020-03-01 DIAGNOSIS — R0902 Hypoxemia: Secondary | ICD-10-CM | POA: Diagnosis not present

## 2020-03-01 DIAGNOSIS — J90 Pleural effusion, not elsewhere classified: Secondary | ICD-10-CM | POA: Diagnosis not present

## 2020-03-01 DIAGNOSIS — J449 Chronic obstructive pulmonary disease, unspecified: Secondary | ICD-10-CM | POA: Diagnosis not present

## 2020-03-01 DIAGNOSIS — Z515 Encounter for palliative care: Secondary | ICD-10-CM | POA: Diagnosis not present

## 2020-03-01 DIAGNOSIS — Z720 Tobacco use: Secondary | ICD-10-CM

## 2020-03-01 LAB — ECHOCARDIOGRAM COMPLETE
AR max vel: 2.52 cm2
AV Area VTI: 2.71 cm2
AV Area mean vel: 2.61 cm2
AV Mean grad: 2.5 mmHg
AV Peak grad: 4.8 mmHg
Ao pk vel: 1.1 m/s
Area-P 1/2: 2.92 cm2
Height: 68 in
S' Lateral: 3.1 cm
Weight: 3492.09 oz

## 2020-03-01 LAB — COMPREHENSIVE METABOLIC PANEL
ALT: 23 U/L (ref 0–44)
AST: 30 U/L (ref 15–41)
Albumin: 3.8 g/dL (ref 3.5–5.0)
Alkaline Phosphatase: 53 U/L (ref 38–126)
Anion gap: 13 (ref 5–15)
BUN: 30 mg/dL — ABNORMAL HIGH (ref 8–23)
CO2: 26 mmol/L (ref 22–32)
Calcium: 9 mg/dL (ref 8.9–10.3)
Chloride: 94 mmol/L — ABNORMAL LOW (ref 98–111)
Creatinine, Ser: 1.73 mg/dL — ABNORMAL HIGH (ref 0.61–1.24)
GFR, Estimated: 39 mL/min — ABNORMAL LOW (ref >60)
Glucose, Bld: 104 mg/dL — ABNORMAL HIGH (ref 70–99)
Potassium: 3.7 mmol/L (ref 3.5–5.1)
Sodium: 133 mmol/L — ABNORMAL LOW (ref 135–145)
Total Bilirubin: 0.7 mg/dL (ref 0.3–1.2)
Total Protein: 7.7 g/dL (ref 6.5–8.1)

## 2020-03-01 LAB — C-REACTIVE PROTEIN: CRP: 6 mg/dL — ABNORMAL HIGH (ref <1.0)

## 2020-03-01 LAB — CBC WITH DIFFERENTIAL/PLATELET
Abs Immature Granulocytes: 0.22 10*3/uL — ABNORMAL HIGH (ref 0.00–0.07)
Basophils Absolute: 0 10*3/uL (ref 0.0–0.1)
Basophils Relative: 0 %
Eosinophils Absolute: 0 10*3/uL (ref 0.0–0.5)
Eosinophils Relative: 0 %
HCT: 39.5 % (ref 39.0–52.0)
Hemoglobin: 13.7 g/dL (ref 13.0–17.0)
Immature Granulocytes: 3 %
Lymphocytes Relative: 9 %
Lymphs Abs: 0.7 10*3/uL (ref 0.7–4.0)
MCH: 32.3 pg (ref 26.0–34.0)
MCHC: 34.7 g/dL (ref 30.0–36.0)
MCV: 93.2 fL (ref 80.0–100.0)
Monocytes Absolute: 0.7 10*3/uL (ref 0.1–1.0)
Monocytes Relative: 9 %
Neutro Abs: 6.1 10*3/uL (ref 1.7–7.7)
Neutrophils Relative %: 79 %
Platelets: 191 10*3/uL (ref 150–400)
RBC: 4.24 MIL/uL (ref 4.22–5.81)
RDW: 12.9 % (ref 11.5–15.5)
WBC: 7.8 10*3/uL (ref 4.0–10.5)
nRBC: 0 % (ref 0.0–0.2)

## 2020-03-01 LAB — FERRITIN: Ferritin: 192 ng/mL (ref 24–336)

## 2020-03-01 LAB — LACTIC ACID, PLASMA
Lactic Acid, Venous: 2.2 mmol/L (ref 0.5–1.9)
Lactic Acid, Venous: 1.4 mmol/L (ref 0.5–1.9)

## 2020-03-01 LAB — CBG MONITORING, ED
Glucose-Capillary: 83 mg/dL (ref 70–99)
Glucose-Capillary: 128 mg/dL — ABNORMAL HIGH (ref 70–99)

## 2020-03-01 LAB — POC SARS CORONAVIRUS 2 AG -  ED: SARS Coronavirus 2 Ag: POSITIVE — AB

## 2020-03-01 LAB — TRIGLYCERIDES: Triglycerides: 77 mg/dL (ref <150)

## 2020-03-01 LAB — FIBRINOGEN: Fibrinogen: 589 mg/dL — ABNORMAL HIGH (ref 210–475)

## 2020-03-01 LAB — GLUCOSE, CAPILLARY: Glucose-Capillary: 121 mg/dL — ABNORMAL HIGH (ref 70–99)

## 2020-03-01 LAB — BRAIN NATRIURETIC PEPTIDE: B Natriuretic Peptide: 82.7 pg/mL (ref 0.0–100.0)

## 2020-03-01 LAB — PROCALCITONIN: Procalcitonin: 0.19 ng/mL

## 2020-03-01 LAB — LACTATE DEHYDROGENASE: LDH: 171 U/L (ref 98–192)

## 2020-03-01 LAB — FIBRIN DERIVATIVES D-DIMER (ARMC ONLY): Fibrin derivatives D-dimer (ARMC): 944.91 ng/mL (FEU) — ABNORMAL HIGH (ref 0.00–499.00)

## 2020-03-01 MED ORDER — METHYLPREDNISOLONE SODIUM SUCC 125 MG IJ SOLR
0.5000 mg/kg | Freq: Two times a day (BID) | INTRAMUSCULAR | Status: DC
  Administered 2020-03-01 – 2020-03-02 (×2): 49.375 mg via INTRAVENOUS
  Filled 2020-03-01 (×2): qty 2

## 2020-03-01 MED ORDER — AMLODIPINE BESYLATE 5 MG PO TABS
5.0000 mg | ORAL_TABLET | Freq: Every day | ORAL | Status: DC
  Administered 2020-03-02: 5 mg via ORAL
  Filled 2020-03-01: qty 1

## 2020-03-01 MED ORDER — ENOXAPARIN SODIUM 60 MG/0.6ML ~~LOC~~ SOLN
0.5000 mg/kg | SUBCUTANEOUS | Status: DC
  Administered 2020-03-01 – 2020-03-03 (×3): 50 mg via SUBCUTANEOUS
  Filled 2020-03-01 (×3): qty 0.6

## 2020-03-01 MED ORDER — ALLOPURINOL 100 MG PO TABS
100.0000 mg | ORAL_TABLET | Freq: Every day | ORAL | Status: DC
Start: 2020-03-01 — End: 2020-03-04
  Administered 2020-03-02 – 2020-03-04 (×3): 100 mg via ORAL
  Filled 2020-03-01 (×4): qty 1

## 2020-03-01 MED ORDER — FOLIC ACID 1 MG PO TABS
1.0000 mg | ORAL_TABLET | Freq: Every day | ORAL | Status: DC
  Administered 2020-03-02 – 2020-03-04 (×3): 1 mg via ORAL
  Filled 2020-03-01 (×3): qty 1

## 2020-03-01 MED ORDER — SODIUM CHLORIDE 0.9 % IV SOLN: 200.0000 mg | Freq: Once | INTRAVENOUS | Status: DC

## 2020-03-01 MED ORDER — ASPIRIN EC 81 MG PO TBEC
81.0000 mg | DELAYED_RELEASE_TABLET | Freq: Every day | ORAL | Status: DC
  Administered 2020-03-02 – 2020-03-04 (×3): 81 mg via ORAL
  Filled 2020-03-01 (×3): qty 1

## 2020-03-01 MED ORDER — DEXAMETHASONE SODIUM PHOSPHATE 10 MG/ML IJ SOLN
6.0000 mg | Freq: Once | INTRAMUSCULAR | Status: AC
  Administered 2020-03-01: 6 mg via INTRAVENOUS
  Filled 2020-03-01: qty 1

## 2020-03-01 MED ORDER — INSULIN DETEMIR 100 UNIT/ML ~~LOC~~ SOLN
0.0750 [IU]/kg | Freq: Two times a day (BID) | SUBCUTANEOUS | Status: DC
  Administered 2020-03-02 – 2020-03-04 (×5): 7 [IU] via SUBCUTANEOUS
  Filled 2020-03-01 (×9): qty 0.07

## 2020-03-01 MED ORDER — SODIUM CHLORIDE 0.9 % IV SOLN
200.0000 mg | Freq: Once | INTRAVENOUS | Status: AC
  Administered 2020-03-01: 200 mg via INTRAVENOUS
  Filled 2020-03-01: qty 200

## 2020-03-01 MED ORDER — ONDANSETRON HCL 4 MG PO TABS: 4.0000 mg | ORAL_TABLET | Freq: Four times a day (QID) | ORAL | Status: DC | PRN

## 2020-03-01 MED ORDER — ACETAMINOPHEN 325 MG PO TABS: 650.0000 mg | ORAL_TABLET | Freq: Four times a day (QID) | ORAL | Status: DC | PRN

## 2020-03-01 MED ORDER — PERFLUTREN LIPID MICROSPHERE
1.0000 mL | INTRAVENOUS | Status: AC | PRN
  Administered 2020-03-01: 2 mL via INTRAVENOUS
  Filled 2020-03-01: qty 10

## 2020-03-01 MED ORDER — ATORVASTATIN CALCIUM 20 MG PO TABS
40.0000 mg | ORAL_TABLET | Freq: Every day | ORAL | Status: DC
  Administered 2020-03-02 – 2020-03-04 (×3): 40 mg via ORAL
  Filled 2020-03-01 (×3): qty 2

## 2020-03-01 MED ORDER — IOHEXOL 350 MG/ML SOLN
75.0000 mL | Freq: Once | INTRAVENOUS | Status: AC | PRN
  Administered 2020-03-01: 60 mL via INTRAVENOUS

## 2020-03-01 MED ORDER — SODIUM CHLORIDE 0.9% FLUSH: 3.0000 mL | INTRAVENOUS | Status: DC | PRN

## 2020-03-01 MED ORDER — LORATADINE 10 MG PO TABS
10.0000 mg | ORAL_TABLET | Freq: Every day | ORAL | Status: DC
  Administered 2020-03-02 – 2020-03-04 (×3): 10 mg via ORAL
  Filled 2020-03-01 (×3): qty 1

## 2020-03-01 MED ORDER — SODIUM CHLORIDE 0.9 % IV SOLN: 100.0000 mg | Freq: Every day | INTRAVENOUS | Status: DC

## 2020-03-01 MED ORDER — SODIUM CHLORIDE 0.9 % IV SOLN: 250.0000 mL | INTRAVENOUS | Status: DC | PRN

## 2020-03-01 MED ORDER — PREDNISONE 50 MG PO TABS: 50.0000 mg | ORAL_TABLET | Freq: Every day | ORAL | Status: DC

## 2020-03-01 MED ORDER — VITAMIN B-12 1000 MCG PO TABS
1000.0000 ug | ORAL_TABLET | Freq: Every day | ORAL | Status: DC
  Administered 2020-03-02 – 2020-03-04 (×3): 1000 ug via ORAL
  Filled 2020-03-01 (×3): qty 1

## 2020-03-01 MED ORDER — ONDANSETRON HCL 4 MG/2ML IJ SOLN: 4.0000 mg | Freq: Four times a day (QID) | INTRAMUSCULAR | Status: DC | PRN

## 2020-03-01 MED ORDER — IPRATROPIUM BROMIDE HFA 17 MCG/ACT IN AERS
2.0000 | INHALATION_SPRAY | Freq: Four times a day (QID) | RESPIRATORY_TRACT | Status: DC
  Administered 2020-03-02 – 2020-03-04 (×11): 2 via RESPIRATORY_TRACT
  Filled 2020-03-01 (×2): qty 12.9

## 2020-03-01 MED ORDER — GUAIFENESIN-DM 100-10 MG/5ML PO SYRP: 10.0000 mL | ORAL_SOLUTION | ORAL | Status: DC | PRN

## 2020-03-01 MED ORDER — SODIUM CHLORIDE 0.9% FLUSH
3.0000 mL | Freq: Two times a day (BID) | INTRAVENOUS | Status: DC
  Administered 2020-03-01 – 2020-03-04 (×5): 3 mL via INTRAVENOUS

## 2020-03-01 MED ORDER — HYDROXYCHLOROQUINE SULFATE 200 MG PO TABS
400.0000 mg | ORAL_TABLET | Freq: Every day | ORAL | Status: DC
  Administered 2020-03-02 – 2020-03-04 (×3): 400 mg via ORAL
  Filled 2020-03-01 (×3): qty 2

## 2020-03-01 MED ORDER — INSULIN ASPART 100 UNIT/ML ~~LOC~~ SOLN
0.0000 [IU] | Freq: Three times a day (TID) | SUBCUTANEOUS | Status: DC
  Administered 2020-03-01: 2 [IU] via SUBCUTANEOUS
  Administered 2020-03-02 – 2020-03-03 (×3): 3 [IU] via SUBCUTANEOUS
  Administered 2020-03-03 (×2): 2 [IU] via SUBCUTANEOUS
  Administered 2020-03-04 (×2): 3 [IU] via SUBCUTANEOUS
  Administered 2020-03-04: 1 [IU] via SUBCUTANEOUS
  Filled 2020-03-01 (×7): qty 1

## 2020-03-01 MED ORDER — SODIUM CHLORIDE 0.9 % IV SOLN
100.0000 mg | Freq: Every day | INTRAVENOUS | Status: DC
  Administered 2020-03-02 – 2020-03-04 (×3): 100 mg via INTRAVENOUS
  Filled 2020-03-01: qty 100
  Filled 2020-03-01: qty 20
  Filled 2020-03-01 (×2): qty 100

## 2020-03-01 NOTE — ED Notes (Signed)
Wife contacted by this rn and given update pt awake and alert.  nsr on monitor.

## 2020-03-01 NOTE — ED Triage Notes (Signed)
Pt from home via ems, states that he tested covid positive about 1.5 week ago, for 2 days has been feeling more sob than he had been. Pt was 80% RA when ems arrived placed on 6L [place] with sats low-mid 90's. Pt denies pain. Has received one covid vaccination

## 2020-03-01 NOTE — ED Notes (Signed)
Pharmacy messaged to reschedule medications for when they will be verified and available. Unable to give medications at this time d/t not being verified

## 2020-03-01 NOTE — ED Notes (Signed)
MD Agbata notified of lactic 2.2. no new orders at this time

## 2020-03-01 NOTE — ED Notes (Signed)
Resumed care from brianna rn.  Pt sleeping  nsr on monitor.  Pt on 6 liters oxygen [place]

## 2020-03-01 NOTE — ED Notes (Signed)
Report off to [employer]

## 2020-03-01 NOTE — ED Notes (Signed)
Pt on cell phone   meds given

## 2020-03-01 NOTE — Progress Notes (Signed)
*  PRELIMINARY RESULTS* Echocardiogram 2D Echocardiogram has been performed.  [name] 03/01/2020, 2:20 PM

## 2020-03-01 NOTE — Progress Notes (Signed)
Remdesivir - Pharmacy Brief Note   Tested positive ~ 1.5 weeks ago.  O:  ALT: 23 CXR: Bibasilar airspace disease is consistent with pneumonia SpO2: 91% on 6 L [place]   A/P:  Remdesivir 200 mg IVPB once followed by 100 mg IVPB daily x 4 days.   Dorena Bodo, PharmD 03/01/2020 11:29 AM

## 2020-03-01 NOTE — ED Notes (Signed)
Pt given warm blanket as requested.  

## 2020-03-01 NOTE — ED Provider Notes (Signed)
[facility] Emergency Department Provider Note    Event Date/Time   First MD Initiated Contact with Patient 03/01/20 1034     (approximate)  I have reviewed the triage vital signs and the nursing notes.   HISTORY  Chief Complaint Covid Positive    HPI Isaac Stevens is a 85 y.o. male below listed past medical history presents to the ER for evaluation of worsening shortness of breath and evidence of acute respiratory failure with hypoxia requiring supplemental oxygen via EMS with O2 sats being down in the 70s and 80s.  Has been feeling unwell for the past several days.  Report of taste positive for Covid.  Denies any pain no nausea or vomiting at this time.  Is having chills.    Past Medical History:  Diagnosis Date  . CAFL (chronic airflow limitation) (HCC)   . CKD (chronic kidney disease)   . Diabetes mellitus without complication ([place])   . Hyperlipidemia   . Hypertension   . Rheumatoid arthritis ([place])    Family History  Problem Relation Age of Onset  . Prostate cancer Father   . Parkinson's disease Mother    Past Surgical History:  Procedure Laterality Date  . TOE SURGERY     right foot   Patient Active Problem List   Diagnosis Date Noted  . Stroke ([place]) 11/19/2018  . Syncope 10/21/2015  . Drug-induced pneumonitis 04/02/2015  . COPD (chronic obstructive pulmonary disease) ([place]) 04/02/2015  . Dyspnea 04/02/2015  . COPD exacerbation ([place]) 03/11/2015  . CAFL (chronic airflow limitation) ([place]) 08/14/2014  . HLD (hyperlipidemia) 08/14/2014  . BP (high blood pressure) 08/14/2014  . Diabetes mellitus type 2, uncontrolled ([place]) 08/14/2014  . Chronic kidney disease (CKD), stage III (moderate) ([place]) 01/27/2014  . 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 Authorizing Provider  allopurinol (ZYLOPRIM) 100 MG tablet Take 100 mg by mouth daily. 11/04/18    [provider]  amLODipine (NORVASC) 5 MG tablet TAKE 1 TABLET BY MOUTH EVERY DAY 04/23/14   [provider]  aspirin EC 81 MG tablet Take 1 tablet (81 mg total) by mouth daily. 11/20/18   Hillary Bow, MD  atorvastatin (LIPITOR) 40 MG tablet Take 1 tablet (40 mg total) by mouth daily at 6 PM. 11/20/18   Hillary Bow, MD  cetirizine (ZYRTEC) 10 MG tablet Take 10 mg by mouth daily.    [provider]  EPIPEN 2-PAK 0.3 MG/0.3ML SOAJ injection See admin instructions. 07/31/14   [provider]  folic acid (FOLVITE) 1 MG tablet TAKE 1 TABLET (1 MG TOTAL) BY MOUTH ONCE DAILY. 03/11/15   [provider]  hydroxychloroquine (PLAQUENIL) 200 MG tablet Take 400 mg by mouth daily.    [provider]  metFORMIN (GLUCOPHAGE) 1000 MG tablet Take 1,000 mg by mouth 2 (two) times daily with a meal.    [provider]  vitamin B-12 (CYANOCOBALAMIN) 1000 MCG tablet Take 1,000 mcg by mouth daily.    [provider]    Allergies Methotrexate derivatives, Lisinopril, Pseudoephedrine hcl, and Sitagliptin    Social History Social History   Tobacco Use  . Smoking status: Former Research scientist (life sciences)  . Smokeless tobacco: Current User    Types: Chew  . Tobacco comment: quit 20 years ago , chews tobacco  Substance Use Topics  . Alcohol use: No    Alcohol/week: 0.0 standard drinks  . Drug  use: No    Review of Systems Patient denies headaches, rhinorrhea, blurry vision, numbness, shortness of breath, chest pain, edema, cough, abdominal pain, nausea, vomiting, diarrhea, dysuria, fevers, rashes or hallucinations unless otherwise stated above in HPI. ____________________________________________   PHYSICAL EXAM:  VITAL SIGNS: Vitals:   03/01/20 1018 03/01/20 1025  BP:  104/61  Pulse: 86   Resp: (!) 22   Temp: 98 F (36.7 C)   SpO2: 93%     Constitutional: Alert and oriented.  Eyes: Conjunctivae are normal.  Head: Atraumatic. Nose: No  congestion/rhinnorhea. Mouth/Throat: Mucous membranes are moist.   Neck: No stridor. Painless ROM.  Cardiovascular: Normal rate, regular rhythm. Grossly normal heart sounds.  Good peripheral circulation. Respiratory: mild tachypnea, scattered crackles. Gastrointestinal: Soft and nontender. No distention. No abdominal bruits. No CVA tenderness. Genitourinary:  Musculoskeletal: No lower extremity tenderness nor edema.  No joint effusions. Neurologic:  Normal speech and language. No gross focal neurologic deficits are appreciated. No facial droop Skin:  Skin is warm, dry and intact. No rash noted. Psychiatric: Mood and affect are normal. Speech and behavior are normal.  ____________________________________________   LABS (all labs ordered are listed, but only abnormal results are displayed)  Results for orders placed or performed during the hospital encounter of 03/01/20 (from the past 24 hour(s))  CBC with Differential     Status: Abnormal   Collection Time: 03/01/20 10:19 AM  Result Value Ref Range   WBC 7.8 4.0 - 10.5 K/uL   RBC 4.24 4.22 - 5.81 MIL/uL   Hemoglobin 13.7 13.0 - 17.0 g/dL   HCT 32.1 22.4 - 82.5 %   MCV 93.2 80.0 - 100.0 fL   MCH 32.3 26.0 - 34.0 pg   MCHC 34.7 30.0 - 36.0 g/dL   RDW 00.3 70.4 - 88.8 %   Platelets 191 150 - 400 K/uL   nRBC 0.0 0.0 - 0.2 %   Neutrophils Relative % 79 %   Neutro Abs 6.1 1.7 - 7.7 K/uL   Lymphocytes Relative 9 %   Lymphs Abs 0.7 0.7 - 4.0 K/uL   Monocytes Relative 9 %   Monocytes Absolute 0.7 0.1 - 1.0 K/uL   Eosinophils Relative 0 %   Eosinophils Absolute 0.0 0.0 - 0.5 K/uL   Basophils Relative 0 %   Basophils Absolute 0.0 0.0 - 0.1 K/uL   Immature Granulocytes 3 %   Abs Immature Granulocytes 0.22 (H) 0.00 - 0.07 K/uL  Comprehensive metabolic panel     Status: Abnormal   Collection Time: 03/01/20 10:19 AM  Result Value Ref Range   Sodium 133 (L) 135 - 145 mmol/L   Potassium 3.7 3.5 - 5.1 mmol/L   Chloride 94 (L) 98 - 111  mmol/L   CO2 26 22 - 32 mmol/L   Glucose, Bld 104 (H) 70 - 99 mg/dL   BUN 30 (H) 8 - 23 mg/dL   Creatinine, Ser 9.16 (H) 0.61 - 1.24 mg/dL   Calcium 9.0 8.9 - 94.5 mg/dL   Total Protein 7.7 6.5 - 8.1 g/dL   Albumin 3.8 3.5 - 5.0 g/dL   AST 30 15 - 41 U/L   ALT 23 0 - 44 U/L   Alkaline Phosphatase 53 38 - 126 U/L   Total Bilirubin 0.7 0.3 - 1.2 mg/dL   GFR, Estimated 39 (L) >60 mL/min   Anion gap 13 5 - 15  Fibrin derivatives D-Dimer     Status: Abnormal   Collection Time: 03/01/20 10:19 AM  Result Value  Ref Range   Fibrin derivatives D-dimer ([facility]) 944.91 (H) 0.00 - 499.00 ng/mL (FEU)  Fibrinogen     Status: Abnormal   Collection Time: 03/01/20 10:19 AM  Result Value Ref Range   Fibrinogen 589 (H) 210 - 475 mg/dL  POC SARS Coronavirus 2 Ag-ED - Nasal Swab     Status: Abnormal   Collection Time: 03/01/20 11:15 AM  Result Value Ref Range   SARS Coronavirus 2 Ag Positive (A) Negative   ____________________________________________  EKG My review and personal interpretation at Time: 10:59   Indication: sob  Rate: 80  Rhythm: sinus Axis: left Other: normal intervals, no stemi ____________________________________________  RADIOLOGY  I personally reviewed all radiographic images ordered to evaluate for the above acute complaints and reviewed radiology reports and findings.  These findings were personally discussed with the patient.  Please see medical record for radiology report.  ____________________________________________   PROCEDURES  Procedure(s) performed:  .Critical Care Performed by: [name], MD Authorized by: [name], MD   Critical care provider statement:    Critical care time (minutes):  35   Critical care time was exclusive of:  Separately billable procedures and treating other patients   Critical care was necessary to treat or prevent imminent or life-threatening deterioration of the following conditions:  Respiratory failure   Critical  care was time spent personally by me on the following activities:  Development of treatment plan with patient or surrogate, discussions with consultants, evaluation of patient's response to treatment, examination of patient, obtaining history from patient or surrogate, ordering and performing treatments and interventions, ordering and review of laboratory studies, ordering and review of radiographic studies, pulse oximetry, re-evaluation of patient's condition and review of old charts      Critical Care performed: yes ____________________________________________   INITIAL IMPRESSION / [place] / ED COURSE  Pertinent labs & imaging results that were available during my care of the patient were reviewed by me and considered in my medical decision making (see chart for details).   DDX: covid 54, Asthma, copd, CHF, pna, ptx, malignancy, Pe, anemia   Isaac Stevens is a 85 y.o. who presents to the ED with evidence of acute respiratory failure with hypoxia requiring supplemental oxygen presentation consistent with COVID-19 pneumonia.  D-dimer is elevated therefore will order CTA.  Have ordered IV Decadron as well as IV remdesivir.  Patient require admission to the hospital for management of his respiratory failure.     The patient was evaluated in Emergency Department today for the symptoms described in the history of present illness. He/she was evaluated in the context of the global COVID-19 pandemic, which necessitated consideration that the patient might be at risk for infection with the SARS-CoV-2 virus that causes COVID-19. Institutional protocols and algorithms that pertain to the evaluation of patients at risk for COVID-19 are in a state of rapid change based on information released by regulatory bodies including the CDC and federal and state organizations. These policies and algorithms were followed during the patient's care in the ED.  As part of my medical decision making, I  reviewed the following data within the [facility] notes reviewed and incorporated, Labs reviewed, notes from prior ED visits and [place] Controlled Substance Database   ____________________________________________   FINAL CLINICAL IMPRESSION(S) / ED DIAGNOSES  Final diagnoses:  Acute respiratory failure with hypoxia ([place])  COVID-19 virus infection      NEW MEDICATIONS STARTED DURING THIS VISIT:  New Prescriptions  No medications on file     Note:  This document was prepared using Dragon voice recognition software and may include unintentional dictation errors.    [name], MD 03/01/20 1133

## 2020-03-01 NOTE — H&P (Addendum)
History and Physical    Isaac Stevens [id number] DOB: 12/27/1935 DOA: 03/01/2020  PCP: Adin Hector, MD   Patient coming from: Home  I have personally briefly reviewed patient's old medical records in [place]  Chief Complaint: Shortness of breath  HPI: Isaac Stevens is a 85 y.o. male with medical history significant for COPD, diabetes mellitus complications of stage III chronic kidney disease, rheumatoid arthritis, hypertension and dyslipidemia who presents to the ER for evaluation of shortness of breath which has worsened over the last 2 days. Patient tested positive for the COVID-19 virus about 1 and half weeks ago, per patient he got the test because his son and daughter-in-law tested positive for the virus and because he had been exposed to them he got a test as well. He has had a cough and shortness of breath which he states got worse over the last 2 days. He denies having any fever or chills, no chest pain, no dizziness, no lightheadedness, no anorexia, no loss of taste, no loss of smell, no abdominal pain, no nausea, no vomiting, no diarrhea, no constipation, no urinary frequency, no dysuria, no nocturia, no headache. Labs show sodium 133, potassium 3.7, chloride 94, bicarb 26, glucose 104, BUN 30, creatinine 1.73, calcium 9.0, alkaline phosphatase 53, albumin 3.8, AST 30, ALT 23, total protein 7.7, LDH 171, triglycerides 77, ferritin 192, lactic acid 2.2, procalcitonin 0.19, white count 7.8, hemoglobin 13.7, hematocrit 39.5, MCV 93.2, RDW 12.9, platelet count 191, fibrinogen 589, fibrin diabetes 944 Chest x-ray reviewed by me shows bibasilar airspace disease consistent with pneumonia.  Small bilateral pleural effusions. Twelve-lead EKG shows normal sinus rhythm with left axis deviation   ED Course: Patient is an 85 year old Caucasian male who presents to the ER for evaluation of worsening shortness of breath.  Patient had a positive COVID-19 PCR test about a week  and a half ago and presents for worsening shortness of breath.  Imaging is consistent with COVID-19 pneumonia.  Patient was noted to be hypoxic in the field with room air pulse oximetry in the 80s.  He is currently on 5 L of oxygen via nasal cannula with pulse oximetry of about 94%.  He received 1 dose of the Covid 19 vaccine and did not receive the second dose because he said he was "scared".  Patient received remdesivir and Decadron in the ER and will be admitted to the hospital for further evaluation.  Review of Systems: As per HPI otherwise all systems reviewed and negative.    Past Medical History:  Diagnosis Date  . CAFL (chronic airflow limitation) (HCC)   . CKD (chronic kidney disease)   . Diabetes mellitus without complication ([place])   . Hyperlipidemia   . Hypertension   . Rheumatoid arthritis [facility])     Past Surgical History:  Procedure Laterality Date  . TOE SURGERY     right foot     reports that he has quit smoking. His smokeless tobacco use includes chew. He reports that he does not drink alcohol and does not use drugs.  Allergies  Allergen Reactions  . Methotrexate Derivatives Shortness Of Breath  . Lisinopril Swelling  . Pseudoephedrine Hcl Other (See Comments)    confusion  . Sitagliptin Rash    Family History  Problem Relation Age of Onset  . Prostate cancer Father   . Parkinson's disease Mother      Prior to Admission medications   Medication Sig Start Date End Date Taking? Authorizing Provider  allopurinol (ZYLOPRIM) 100 MG tablet Take 100 mg by mouth daily. 11/04/18   [provider]  amLODipine (NORVASC) 5 MG tablet TAKE 1 TABLET BY MOUTH EVERY DAY 04/23/14   [provider]  aspirin EC 81 MG tablet Take 1 tablet (81 mg total) by mouth daily. 11/20/18   Hillary Bow, MD  atorvastatin (LIPITOR) 40 MG tablet Take 1 tablet (40 mg total) by mouth daily at 6 PM. 11/20/18   Hillary Bow, MD  cetirizine (ZYRTEC) 10 MG tablet Take 10 mg by  mouth daily.    [provider]  EPIPEN 2-PAK 0.3 MG/0.3ML SOAJ injection See admin instructions. 07/31/14   [provider]  folic acid (FOLVITE) 1 MG tablet TAKE 1 TABLET (1 MG TOTAL) BY MOUTH ONCE DAILY. 03/11/15   [provider]  hydroxychloroquine (PLAQUENIL) 200 MG tablet Take 400 mg by mouth daily.    [provider]  metFORMIN (GLUCOPHAGE) 1000 MG tablet Take 1,000 mg by mouth 2 (two) times daily with a meal.    [provider]  vitamin B-12 (CYANOCOBALAMIN) 1000 MCG tablet Take 1,000 mcg by mouth daily.    [provider]    Physical Exam: Vitals:   03/01/20 1018 03/01/20 1022 03/01/20 1025  BP:   104/61  Pulse: 86    Resp: (!) 22    Temp: 98 F (36.7 C)    TempSrc: Oral    SpO2: 93%    Weight:  99 kg   Height:  5\' 8"  (1.727 m)      Vitals:   03/01/20 1018 03/01/20 1022 03/01/20 1025  BP:   104/61  Pulse: 86    Resp: (!) 22    Temp: 98 F (36.7 C)    TempSrc: Oral    SpO2: 93%    Weight:  99 kg   Height:  5\' 8"  (1.727 m)     Constitutional: NAD, alert and oriented x 3.  Appears tachypneic Eyes: PERRL, lids and conjunctivae pallor ENMT: Mucous membranes are moist.  Neck: normal, supple, no masses, no thyromegaly Respiratory: Bilateral air entry, no wheezing, no crackles. Normal respiratory effort. No accessory muscle use.  Cardiovascular: Regular rate and rhythm, no murmurs / rubs / gallops. No extremity edema. 2+ pedal pulses. No carotid bruits.  Abdomen: no tenderness, no masses palpated. No hepatosplenomegaly. Bowel sounds positive.  Musculoskeletal: no clubbing / cyanosis. No joint deformity upper and lower extremities.  Skin: no rashes, lesions, ulcers.  Neurologic: No gross focal neurologic deficit. Psychiatric: Normal mood and affect.   Labs on Admission: I have personally reviewed following labs and imaging studies  CBC: Recent Labs  Lab 03/01/20 1019  WBC 7.8  NEUTROABS 6.1  HGB 13.7  HCT  39.5  MCV 93.2  PLT 627   Basic Metabolic Panel: Recent Labs  Lab 03/01/20 1019  NA 133*  K 3.7  CL 94*  CO2 26  GLUCOSE 104*  BUN 30*  CREATININE 1.73*  CALCIUM 9.0   GFR: Estimated Creatinine Clearance: 37.5 mL/min (A) (by C-G formula based on SCr of 1.73 mg/dL (H)). Liver Function Tests: Recent Labs  Lab 03/01/20 1019  AST 30  ALT 23  ALKPHOS 53  BILITOT 0.7  PROT 7.7  ALBUMIN 3.8   No results for input(s): LIPASE, AMYLASE in the last 168 hours. No results for input(s): AMMONIA in the last 168 hours. Coagulation Profile: No results for input(s): INR, PROTIME in the last 168 hours. Cardiac Enzymes: No results for input(s): CKTOTAL, CKMB,  CKMBINDEX, TROPONINI in the last 168 hours. BNP (last 3 results) No results for input(s): PROBNP in the last 8760 hours. HbA1C: No results for input(s): HGBA1C in the last 72 hours. CBG: Recent Labs  Lab 03/01/20 1225  GLUCAP 83   Lipid Profile: Recent Labs    03/01/20 1019  TRIG 77   Thyroid Function Tests: No results for input(s): TSH, T4TOTAL, FREET4, T3FREE, THYROIDAB in the last 72 hours. Anemia Panel: Recent Labs    03/01/20 1019  FERRITIN 192   Urine analysis:    Component Value Date/Time   COLORURINE YELLOW (A) 10/21/2015 2029   APPEARANCEUR HAZY (A) 10/21/2015 2029   LABSPEC 1.015 10/21/2015 2029   PHURINE 5.0 10/21/2015 2029   GLUCOSEU NEGATIVE 10/21/2015 2029   HGBUR NEGATIVE 10/21/2015 2029   [place] NEGATIVE 10/21/2015 2029   KETONESUR NEGATIVE 10/21/2015 2029   PROTEINUR NEGATIVE 10/21/2015 2029   NITRITE POSITIVE (A) 10/21/2015 2029   LEUKOCYTESUR 1+ (A) 10/21/2015 2029    Radiological Exams on Admission: DG Chest Port 1 View  Result Date: 03/01/2020 CLINICAL DATA:  COVID positive. Increasing shortness of breath. Hypoxia. EXAM: PORTABLE CHEST 1 VIEW COMPARISON:  One-view chest x-ray 03/11/2015. FINDINGS: Heart size is normal. Bibasilar airspace opacities are present. Small effusions  are present. IMPRESSION: 1. Bibasilar airspace disease is consistent with pneumonia. 2. Small bilateral pleural effusions. Electronically Signed   By: San Morelle M.D.   On: 03/01/2020 11:16    EKG: Independently reviewed. Normal sinus rhythm with left axis deviation  Assessment/Plan Principal Problem:   Pneumonia due to COVID-19 virus Active Problems:   COPD (chronic obstructive pulmonary disease) (HCC)   Type 2 diabetes mellitus with stage 3 chronic kidney disease (HCC)   Acute respiratory failure due to COVID-19 ([place])   Rheumatoid arthritis (HCC)   Essential hypertension      Pneumonia due to COVID-19 virus with acute respiratory failure Patient presents to the ER for evaluation of worsening shortness of breath and a cough He was hypoxic in the field and tachypneic with room air pulse oximetry of about 80% and is currently on 5 L of oxygen with pulse oximetry of 94% Chest x-ray shows findings consistent with Covid pneumonia Patient has had symptoms for over a week and his initial COVID-19 PCR test was about 10 days ago. We will place patient on remdesivir and Solu-Medrol Supportive care with antitussives and bronchodilator therapy     Type 2 diabetes mellitus with stage III chronic kidney disease Maintain consistent carbohydrate diet Expect hyperglycemia since patient is on systemic steroids Place patient on long-acting insulin as well as sliding scale coverage with Accu-Cheks before meals and at bedtime     Hypertension Continue amlodipine    Rheumatoid arthritis Continue Plaquenil     DVT prophylaxis: Lovenox Code Status: Full code Family Communication: Greater than 50% of time was spent discussing patient's condition and plan of care with him at the bedside.  All questions and concerns have been addressed.  He verbalizes understanding and agrees with the plan.  CODE STATUS was discussed and patient wishes to be placed on a DO NOT RESUSCITATE  status. Disposition Plan: Back to previous home environment Consults called: None    Isaac Tennell MD [facility]     03/01/2020, 1:05 PM

## 2020-03-02 ENCOUNTER — Encounter: Payer: Self-pay | Admitting: Internal Medicine

## 2020-03-02 LAB — COMPREHENSIVE METABOLIC PANEL
ALT: 17 U/L (ref 0–44)
AST: 24 U/L (ref 15–41)
Albumin: 3.2 g/dL — ABNORMAL LOW (ref 3.5–5.0)
Alkaline Phosphatase: 50 U/L (ref 38–126)
Anion gap: 13 (ref 5–15)
BUN: 37 mg/dL — ABNORMAL HIGH (ref 8–23)
CO2: 23 mmol/L (ref 22–32)
Calcium: 8.6 mg/dL — ABNORMAL LOW (ref 8.9–10.3)
Chloride: 97 mmol/L — ABNORMAL LOW (ref 98–111)
Creatinine, Ser: 1.45 mg/dL — ABNORMAL HIGH (ref 0.61–1.24)
GFR, Estimated: 48 mL/min — ABNORMAL LOW (ref >60)
Glucose, Bld: 143 mg/dL — ABNORMAL HIGH (ref 70–99)
Potassium: 4.4 mmol/L (ref 3.5–5.1)
Sodium: 133 mmol/L — ABNORMAL LOW (ref 135–145)
Total Bilirubin: 0.5 mg/dL (ref 0.3–1.2)
Total Protein: 6.8 g/dL (ref 6.5–8.1)

## 2020-03-02 LAB — FERRITIN: Ferritin: 229 ng/mL (ref 24–336)

## 2020-03-02 LAB — MAGNESIUM: Magnesium: 2.1 mg/dL (ref 1.7–2.4)

## 2020-03-02 LAB — CBC WITH DIFFERENTIAL/PLATELET
Abs Immature Granulocytes: 0.09 10*3/uL — ABNORMAL HIGH (ref 0.00–0.07)
Basophils Absolute: 0 10*3/uL (ref 0.0–0.1)
Basophils Relative: 1 %
Eosinophils Absolute: 0 10*3/uL (ref 0.0–0.5)
Eosinophils Relative: 0 %
HCT: 37.6 % — ABNORMAL LOW (ref 39.0–52.0)
Hemoglobin: 13 g/dL (ref 13.0–17.0)
Immature Granulocytes: 2 %
Lymphocytes Relative: 7 %
Lymphs Abs: 0.3 10*3/uL — ABNORMAL LOW (ref 0.7–4.0)
MCH: 32.6 pg (ref 26.0–34.0)
MCHC: 34.6 g/dL (ref 30.0–36.0)
MCV: 94.2 fL (ref 80.0–100.0)
Monocytes Absolute: 0.3 10*3/uL (ref 0.1–1.0)
Monocytes Relative: 7 %
Neutro Abs: 3.4 10*3/uL (ref 1.7–7.7)
Neutrophils Relative %: 83 %
Platelets: 150 10*3/uL (ref 150–400)
RBC: 3.99 MIL/uL — ABNORMAL LOW (ref 4.22–5.81)
RDW: 12.7 % (ref 11.5–15.5)
WBC: 4 10*3/uL (ref 4.0–10.5)
nRBC: 0 % (ref 0.0–0.2)

## 2020-03-02 LAB — GLUCOSE, CAPILLARY
Glucose-Capillary: 152 mg/dL — ABNORMAL HIGH (ref 70–99)
Glucose-Capillary: 180 mg/dL — ABNORMAL HIGH (ref 70–99)
Glucose-Capillary: 185 mg/dL — ABNORMAL HIGH (ref 70–99)

## 2020-03-02 LAB — PROCALCITONIN: Procalcitonin: 0.2 ng/mL

## 2020-03-02 LAB — PHOSPHORUS: Phosphorus: 5.8 mg/dL — ABNORMAL HIGH (ref 2.5–4.6)

## 2020-03-02 LAB — C-REACTIVE PROTEIN: CRP: 8 mg/dL — ABNORMAL HIGH (ref <1.0)

## 2020-03-02 LAB — FIBRIN DERIVATIVES D-DIMER (ARMC ONLY): Fibrin derivatives D-dimer (ARMC): 668.58 ng/mL (FEU) — ABNORMAL HIGH (ref 0.00–499.00)

## 2020-03-02 MED ORDER — DEXAMETHASONE SODIUM PHOSPHATE 10 MG/ML IJ SOLN
6.0000 mg | INTRAMUSCULAR | Status: DC
  Administered 2020-03-03: 6 mg via INTRAVENOUS
  Filled 2020-03-02: qty 0.6

## 2020-03-02 MED ORDER — SULFAMETHOXAZOLE-TRIMETHOPRIM 400-80 MG PO TABS
1.0000 | ORAL_TABLET | Freq: Two times a day (BID) | ORAL | Status: DC
  Administered 2020-03-02 – 2020-03-04 (×4): 1 via ORAL
  Filled 2020-03-02 (×5): qty 1

## 2020-03-02 MED ORDER — FUROSEMIDE 10 MG/ML IJ SOLN
20.0000 mg | Freq: Every day | INTRAMUSCULAR | Status: DC
  Administered 2020-03-02 – 2020-03-04 (×3): 20 mg via INTRAVENOUS
  Filled 2020-03-02 (×4): qty 4

## 2020-03-02 MED ORDER — BARICITINIB 1 MG PO TABS
2.0000 mg | ORAL_TABLET | Freq: Every day | ORAL | Status: DC
  Administered 2020-03-02 – 2020-03-04 (×3): 2 mg via ORAL
  Filled 2020-03-02: qty 1
  Filled 2020-03-02 (×3): qty 2

## 2020-03-02 MED ORDER — ALBUMIN HUMAN 25 % IV SOLN
12.5000 g | Freq: Every day | INTRAVENOUS | Status: DC
  Administered 2020-03-03 – 2020-03-04 (×2): 12.5 g via INTRAVENOUS
  Filled 2020-03-02 (×2): qty 50

## 2020-03-02 NOTE — Progress Notes (Signed)
02 sats sustained in the low 80's on 6L hiflo n/c.  Pt's RR remain even and unlabored.  Pt reports being unable to prone, instead he turned to lt side with lt arm raised.  02 increased to 8L with no effect.  02 sats finally sustained in low 90's on 10L via hiflo n/c.

## 2020-03-02 NOTE — Progress Notes (Signed)
1        [place] [facility] [place] NAME: Georgie Haque    MR#:  [id number]  DATE OF BIRTH:  1935/07/06  SUBJECTIVE:  CHIEF COMPLAINT:   Chief Complaint  Patient presents with  . Covid Positive  Concerned about his Covid as he was trying to manage at home but symptoms continue to get worse.  Feels short of breath and coughing.  Reports taking 1 dose of COVID vaccine only. REVIEW OF SYSTEMS:  Review of Systems  Constitutional: Positive for malaise/fatigue. Negative for diaphoresis, fever and weight loss.  HENT: Negative for ear discharge, ear pain, hearing loss, nosebleeds, sore throat and tinnitus.   Eyes: Negative for blurred vision and pain.  Respiratory: Positive for cough and shortness of breath. Negative for hemoptysis and wheezing.   Cardiovascular: Negative for chest pain, palpitations, orthopnea and leg swelling.  Gastrointestinal: Negative for abdominal pain, blood in stool, constipation, diarrhea, heartburn, nausea and vomiting.  Genitourinary: Negative for dysuria, frequency and urgency.  Musculoskeletal: Negative for back pain and myalgias.  Skin: Negative for itching and rash.  Neurological: Negative for dizziness, tingling, tremors, focal weakness, seizures, weakness and headaches.  Psychiatric/Behavioral: Negative for depression. The patient is not nervous/anxious.    DRUG ALLERGIES:   Allergies  Allergen Reactions  . Methotrexate Derivatives Shortness Of Breath  . Lisinopril Swelling  . Pseudoephedrine Hcl Other (See Comments)    confusion  . Sitagliptin Rash   VITALS:  Blood pressure 124/68, pulse 61, temperature 98.4 F (36.9 C), temperature source Oral, resp. rate 20, height 5\' 8"  (1.727 m), weight 99 kg, SpO2 90 %. PHYSICAL EXAMINATION:  Physical Exam HENT:     Head: Normocephalic and atraumatic.  Eyes:     Extraocular Movements: EOM normal.     Conjunctiva/sclera: Conjunctivae normal.     Pupils: Pupils are equal, round, and  reactive to light.  Neck:     Thyroid: No thyromegaly.     Trachea: No tracheal deviation.  Cardiovascular:     Rate and Rhythm: Normal rate and regular rhythm.     Heart sounds: Normal heart sounds.  Pulmonary:     Effort: Pulmonary effort is normal. No respiratory distress.     Breath sounds: Examination of the right-lower field reveals wheezing and rhonchi. Examination of the left-lower field reveals wheezing and rhonchi. Decreased breath sounds, wheezing and rhonchi present.  Chest:     Chest wall: No tenderness.  Abdominal:     General: Bowel sounds are normal. There is no distension.     Palpations: Abdomen is soft.     Tenderness: There is no abdominal tenderness.  Musculoskeletal:        General: Normal range of motion.     Cervical back: Normal range of motion and neck supple.  Skin:    General: Skin is warm and dry.     Findings: No rash.  Neurological:     Mental Status: He is alert and oriented to person, place, and time.     Cranial Nerves: No cranial nerve deficit.    LABORATORY PANEL:  Male CBC Recent Labs  Lab 03/02/20 0729  WBC 4.0  HGB 13.0  HCT 37.6*  PLT 150   ------------------------------------------------------------------------------------------------------------------ Chemistries  Recent Labs  Lab 03/02/20 0729  NA 133*  K 4.4  CL 97*  CO2 23  GLUCOSE 143*  BUN 37*  CREATININE 1.45*  CALCIUM 8.6*  MG 2.1  AST 24  ALT 17  ALKPHOS 50  BILITOT 0.5   RADIOLOGY:  No results found. ASSESSMENT AND PLAN:  85 y.o. male with medical history significant for COPD, diabetes mellitus complications of stage III chronic kidney disease, rheumatoid arthritis, hypertension and dyslipidemia admitted for acute hypoxic resp failure due to COVID pna.  ED course: He was hypoxic in the field with room air pulse oximetry in the 80s.  He required 5 L of oxygen via nasal cannula with pulse oximetry of about 94%  1/25: hypoxia worsened with minimal  ambulation/going to bathroom.  Now requiring 15 L oxygen with nonrebreather mask and saturating 87 to 89%.  Will request pulmonary consultation.  If worsens clinically he may need higher level of care at PCU/stepdown.  Acute hypoxic respiratory failure due to pneumonia from COVID-19 infection -Chest x-ray findings consistent with Covid pneumonia -Worsening hypoxia with minimal exertion requiring 15 L oxygen with nonrebreather mask -Continue baricitinib, remdesivir and steroids per protocol - Wean oxygen as able -Continue antitussives and bronchodilator -Pulmonary consultation  Diabetes type 2 Continue insulin Levemir 7 units subcu twice daily Sliding scale Consult diabetic nurse  CKD stage IIIa Stable at this time. Monitor  Hypertension Continue amlodipine  Rheumatoid arthritis On Plaquenil   Body mass index is 33.19 kg/m.  Net IO Since Admission: -300 mL [03/02/20 1522]     Status is: Inpatient  Remains inpatient appropriate because:Inpatient level of care appropriate due to severity of illness   Dispo: The patient is from: Home              Anticipated d/c is to: Home              Anticipated d/c date is: 3 days              Patient currently is not medically stable to d/c.   Difficult to place patient No       DVT prophylaxis:        Lovenox 50 mg subcu daily started on 1/24    Family Communication: ("discussed with patient")   All the records are reviewed and case discussed with Care Management/Social Worker. Management plans discussed with the patient, nursing and they are in agreement.  CODE STATUS: Full Code Level of care: Med-Surg  TOTAL TIME TAKING CARE OF THIS PATIENT: 35 minutes.   More than 50% of the time was spent in counseling/coordination of care: YES  POSSIBLE D/C IN 3-4 DAYS, DEPENDING ON CLINICAL CONDITION.   Max Sane M.D on 03/02/2020 at 3:22 PM  [facility]   CC: Primary care physician; Adin Hector, MD  Note:  This dictation was prepared with Dragon dictation along with smaller phrase technology. Any transcriptional errors that result from this process are unintentional.

## 2020-03-02 NOTE — Hospital Course (Signed)
85 y.o. male with medical history significant for COPD, diabetes mellitus complications of stage III chronic kidney disease, rheumatoid arthritis, hypertension and dyslipidemia admitted for acute hypoxic resp failure due to COVID pna.  ED course: He was hypoxic in the field with room air pulse oximetry in the 80s.  He required 5 L of oxygen via nasal cannula with pulse oximetry of about 94%  1/25: hypoxia worsened with minimal ambulation/going to bathroom.  Now requiring 15 L oxygen with nonrebreather mask and saturating 87 to 89%.  Will request pulmonary consultation.  If worsens clinically he may need higher level of care at PCU/stepdown.

## 2020-03-02 NOTE — Consult Note (Signed)
Pulmonary Medicine          Date: 03/02/2020,   MRN# [id number] Isaac Stevens February 28, 1935     AdmissionWeight: 99 kg                 CurrentWeight: 99 kg   Referring physician: Dr. Manuella Ghazi  CHIEF COMPLAINT:   Acute hypoxemic respiratory failure due to COVID-19   HISTORY OF PRESENT ILLNESS   This is a very pleasant 85 year old male who has a history of CKD 3, rheumatoid arthritis essential hypertension dyslipidemia came in with worsening shortness of breath over the last 1 week was found to be acutely positive for COVID-19 with hypoxemia.He denies having any fever or chills, no chest pain, no dizziness, no lightheadedness, no anorexia, no loss of taste, no loss of smell, no abdominal pain, no nausea, no vomiting, no diarrhea, no constipation, no urinary frequency, no dysuria, no nocturia, no headache. He is a former smoker but has not smoked in 30 years. He can walk on his own usually but has not been able to do that for 1 week since being sick  Patient is vaccinated for COVID-19 x1.  Patient was initiated on Decadron remdesivir per Covid protocol.  He was noted to have increased O2 requirement with progressively worsening over the last 24 hours and episodes of respiratory distress.  PCCM consultation placed for additional evaluation and management.  CT chest reviewed by me with groundglass opacification peripheral predominance consistent with COVID-19 pneumonitis without pneumothorax, consolidated infiltrate, significant pleural effusion.  PAST MEDICAL HISTORY   Past Medical History:  Diagnosis Date  . CAFL (chronic airflow limitation) (HCC)   . CKD (chronic kidney disease)   . Diabetes mellitus without complication ([place])   . Hyperlipidemia   . Hypertension   . Rheumatoid arthritis ([place])      SURGICAL HISTORY   Past Surgical History:  Procedure Laterality Date  . TOE SURGERY     right foot     FAMILY HISTORY   Family History  Problem Relation Age of Onset  .  Prostate cancer Father   . Parkinson's disease Mother      SOCIAL HISTORY   Social History   Tobacco Use  . Smoking status: Former Research scientist (life sciences)  . Smokeless tobacco: Current User    Types: Chew  . Tobacco comment: quit 20 years ago , chews tobacco  Substance Use Topics  . Alcohol use: No    Alcohol/week: 0.0 standard drinks  . Drug use: No     MEDICATIONS    Home Medication:    Current Medication:  Current Facility-Administered Medications:  .  0.9 %  sodium chloride infusion, 250 mL, Intravenous, PRN, Agbata, Tochukwu, MD .  acetaminophen (TYLENOL) tablet 650 mg, 650 mg, Oral, Q6H PRN, Agbata, Tochukwu, MD .  allopurinol (ZYLOPRIM) tablet 100 mg, 100 mg, Oral, Daily, Agbata, Tochukwu, MD, 100 mg at 03/02/20 1159 .  amLODipine (NORVASC) tablet 5 mg, 5 mg, Oral, Daily, Agbata, Tochukwu, MD, 5 mg at 03/02/20 1110 .  aspirin EC tablet 81 mg, 81 mg, Oral, Daily, Agbata, Tochukwu, MD, 81 mg at 03/02/20 1109 .  atorvastatin (LIPITOR) tablet 40 mg, 40 mg, Oral, q1800, Agbata, Tochukwu, MD .  baricitinib (OLUMIANT) tablet 2 mg, 2 mg, Oral, Daily, Manuella Ghazi, Vipul, MD, 2 mg at 03/02/20 1159 .  enoxaparin (LOVENOX) injection 50 mg, 0.5 mg/kg, Subcutaneous, Q24H, Agbata, Tochukwu, MD, 50 mg at 03/01/20 2255 .  folic acid (FOLVITE) tablet 1 mg, 1 mg, Oral, Daily,  Collier Bullock, MD, 1 mg at 03/02/20 1109 .  guaiFENesin-dextromethorphan (ROBITUSSIN DM) 100-10 MG/5ML syrup 10 mL, 10 mL, Oral, Q4H PRN, Agbata, Tochukwu, MD .  hydroxychloroquine (PLAQUENIL) tablet 400 mg, 400 mg, Oral, Daily, Agbata, Tochukwu, MD, 400 mg at 03/02/20 1110 .  insulin aspart (novoLOG) injection 0-15 Units, 0-15 Units, Subcutaneous, TID WC, Agbata, Tochukwu, MD, 3 Units at 03/02/20 0811 .  insulin detemir (LEVEMIR) injection 7 Units, 0.075 Units/kg, Subcutaneous, BID, Agbata, Tochukwu, MD, 7 Units at 03/02/20 1109 .  ipratropium (ATROVENT HFA) inhaler 2 puff, 2 puff, Inhalation, Q6H, Agbata, Tochukwu, MD, 2 puff at  03/02/20 1111 .  loratadine (CLARITIN) tablet 10 mg, 10 mg, Oral, Daily, Agbata, Tochukwu, MD, 10 mg at 03/02/20 1109 .  methylPREDNISolone sodium succinate (SOLU-MEDROL) 125 mg/2 mL injection 49.375 mg, 0.5 mg/kg, Intravenous, Q12H, 49.375 mg at 03/02/20 1158 **FOLLOWED BY** [START ON 03/05/2020] predniSONE (DELTASONE) tablet 50 mg, 50 mg, Oral, Daily, Agbata, Tochukwu, MD .  ondansetron (ZOFRAN) tablet 4 mg, 4 mg, Oral, Q6H PRN **OR** ondansetron (ZOFRAN) injection 4 mg, 4 mg, Intravenous, Q6H PRN, Agbata, Tochukwu, MD .  Margrett Rud remdesivir 200 mg in sodium chloride 0.9% 250 mL IVPB, 200 mg, Intravenous, Once, Stopped at 03/01/20 1542 **FOLLOWED BY** remdesivir 100 mg in sodium chloride 0.9 % 100 mL IVPB, 100 mg, Intravenous, Daily, [name], MD, Last Rate: 200 mL/hr at 03/02/20 1109, 100 mg at 03/02/20 1109 .  sodium chloride flush (NS) 0.9 % injection 3 mL, 3 mL, Intravenous, Q12H, Agbata, Tochukwu, MD, 3 mL at 03/01/20 2303 .  sodium chloride flush (NS) 0.9 % injection 3 mL, 3 mL, Intravenous, PRN, Agbata, Tochukwu, MD .  vitamin B-12 (CYANOCOBALAMIN) tablet 1,000 mcg, 1,000 mcg, Oral, Daily, Agbata, Tochukwu, MD, 1,000 mcg at 03/02/20 1110    ALLERGIES   Methotrexate derivatives, Lisinopril, Pseudoephedrine hcl, and Sitagliptin     REVIEW OF SYSTEMS    Review of Systems:  Gen:  Denies  fever, sweats, chills weigh loss  HEENT: Denies blurred vision, double vision, ear pain, eye pain, hearing loss, nose bleeds, sore throat Cardiac:  No dizziness, chest pain or heaviness, chest tightness,edema Resp:   Denies cough or sputum porduction, shortness of breath,wheezing, hemoptysis,  Gi: Denies swallowing difficulty, stomach pain, nausea or vomiting, diarrhea, constipation, bowel incontinence Gu:  Denies bladder incontinence, burning urine Ext:   Denies Joint pain, stiffness or swelling Skin: Denies  skin rash, easy bruising or bleeding or hives Endoc:  Denies polyuria,  polydipsia , polyphagia or weight change Psych:   Denies depression, insomnia or hallucinations   Other:  All other systems negative   VS: BP 124/68 (BP Location: Left Arm)   Pulse 61   Temp 98.4 F (36.9 C) (Oral)   Resp 20   Ht 5\' 8"  (1.727 m)   Wt 99 kg   SpO2 90%   BMI 33.19 kg/m      PHYSICAL EXAM    GENERAL:NAD, no fevers, chills, no weakness no fatigue HEAD: Normocephalic, atraumatic.  EYES: Pupils equal, round, reactive to light. Extraocular muscles intact. No scleral icterus.  MOUTH: Moist mucosal membrane. Dentition intact. No abscess noted.  EAR, NOSE, THROAT: Clear without exudates. No external lesions.  NECK: Supple. No thyromegaly. No nodules. No JVD.  PULMONARY: Rhonchorous breath sounds bilaterally CARDIOVASCULAR: S1 and S2. Regular rate and rhythm. No murmurs, rubs, or gallops. No edema. Pedal pulses 2+ bilaterally.  GASTROINTESTINAL: Soft, nontender, nondistended. No masses. Positive bowel sounds. No hepatosplenomegaly.  MUSCULOSKELETAL: No swelling, clubbing, or  edema. Range of motion full in all extremities.  NEUROLOGIC: Cranial nerves II through XII are intact. No gross focal neurological deficits. Sensation intact. Reflexes intact.  SKIN: No ulceration, lesions, rashes, or cyanosis. Skin warm and dry. Turgor intact.  PSYCHIATRIC: Mood, affect within normal limits. The patient is awake, alert and oriented x 3. Insight, judgment intact.       IMAGING       CT Angio Chest PE W and/or Wo Contrast  Result Date: 03/01/2020 CLINICAL DATA:  Shortness of breath. Positive D-dimer study. COVID-19 positive EXAM: CT ANGIOGRAPHY CHEST WITH CONTRAST TECHNIQUE: Multidetector CT imaging of the chest was performed using the standard protocol during bolus administration of intravenous contrast. Multiplanar CT image reconstructions and MIPs were obtained to evaluate the vascular anatomy. CONTRAST:  41mL OMNIPAQUE IOHEXOL 350 MG/ML SOLN COMPARISON:  Chest radiograph  March 01, 2020; chest CT July 14, 2015 FINDINGS: Cardiovascular: There is no demonstrable pulmonary embolus. Ascending thoracic aortic diameter measures 4.1 x 4.1 cm. There is no appreciable thoracic aortic dissection. There are scattered foci of calcification in visualized great vessels. Note that the right innominate and left common carotid arteries arise as a common trunk, an anatomic variant. There is aortic atherosclerosis. There are scattered foci of coronary artery calcification. There is no pericardial thickening. Minimal pericardial fluid is within physiologic range. Mediastinum/Nodes: Visualized thyroid appears unremarkable. There is no evident thoracic adenopathy. No esophageal lesions are appreciable. Lungs/Pleura: There is extensive multifocal airspace opacity bilaterally, overall more severe on the right than on the left, without appreciable airspace consolidation. No pleural effusions are evident. No appreciable pneumothorax. Trachea and major bronchial structures appear patent. Upper Abdomen: There is incomplete visualization of an apparent cyst arising from the upper pole of the right kidney posteriorly measuring 1.5 x 1.0 cm. The liver as a somewhat nodular contour suggesting underlying cirrhosis. There is upper abdominal aortic and mesenteric arterial vascular calcification. Musculoskeletal: There is degenerative change in the lower thoracic spine. There are no blastic or lytic bone lesions. No evident chest wall lesions. Review of the MIP images confirms the above findings. IMPRESSION: 1. No demonstrable pulmonary embolus. Ascending thoracic aortic diameter measures 4.1 x 4.1 cm. Recommend annual imaging followup by CTA or MRA. This recommendation follows 2010 ACCF/AHA/AATS/ACR/ASA/SCA/SCAI/SIR/STS/SVM Guidelines for the Diagnosis and Management of Patients with Thoracic Aortic Disease. Circulation. 2010; 121: G283-M629. Aortic aneurysm NOS (ICD10-I71.9). No dissection. Foci of great vessel and  coronary artery calcification noted. There is aortic atherosclerosis. 2. Multifocal airspace opacity, likely due to multifocal atypical organism pneumonia. No consolidation. 3.  No evident adenopathy. 4.  Contour of the liver suggests underlying hepatic cirrhosis. Aortic Atherosclerosis (ICD10-I70.0). Electronically Signed   By: Lowella Grip III M.D.   On: 03/01/2020 13:38   DG Chest Port 1 View  Result Date: 03/01/2020 CLINICAL DATA:  COVID positive. Increasing shortness of breath. Hypoxia. EXAM: PORTABLE CHEST 1 VIEW COMPARISON:  One-view chest x-ray 03/11/2015. FINDINGS: Heart size is normal. Bibasilar airspace opacities are present. Small effusions are present. IMPRESSION: 1. Bibasilar airspace disease is consistent with pneumonia. 2. Small bilateral pleural effusions. Electronically Signed   By: San Morelle M.D.   On: 03/01/2020 11:16   ECHOCARDIOGRAM COMPLETE  Result Date: 03/01/2020    ECHOCARDIOGRAM REPORT   Patient Name:   Isaac Stevens Date of Exam: 03/01/2020 Medical Rec #:  [id number]        Height:       68.0 in Accession #:    [id number]  Weight:       218.3 lb Date of Birth:  May 06, 1935        BSA:          2.121 m Patient Age:    62 years         BP:           104/61 mmHg Patient Gender: M                HR:           86 bpm. Exam Location:  [facility] Procedure: 2D Echo, Cardiac Doppler, Color Doppler and Intracardiac            Opacification Agent Indications:     CHF- acute diastolic Z61.09  History:         Patient has prior history of Echocardiogram examinations, most                  recent 11/20/2018. Risk Factors:Hypertension and Diabetes.  Sonographer:     [name] RDCS (AE) Referring Phys:  [place] Diagnosing Phys: Neoma Laming MD  Sonographer Comments: Technically challenging study due to limited acoustic windows and Technically difficult study due to poor echo windows. IMPRESSIONS  1. Left ventricular ejection fraction, by estimation, is 50 to 55%.  The left ventricle has low normal function. The left ventricle has no regional wall motion abnormalities. There is mild concentric left ventricular hypertrophy. Left ventricular diastolic parameters are consistent with Grade I diastolic dysfunction (impaired relaxation).  2. Right ventricular systolic function is normal. The right ventricular size is normal.  3. The mitral valve is normal in structure. Mild mitral valve regurgitation. No evidence of mitral stenosis.  4. The aortic valve is normal in structure. Aortic valve regurgitation is not visualized. No aortic stenosis is present.  5. The inferior vena cava is normal in size with greater than 50% respiratory variability, suggesting right atrial pressure of 3 mmHg. FINDINGS  Left Ventricle: Left ventricular ejection fraction, by estimation, is 50 to 55%. The left ventricle has low normal function. The left ventricle has no regional wall motion abnormalities. Definity contrast agent was given IV to delineate the left ventricular endocardial borders. The left ventricular internal cavity size was normal in size. There is mild concentric left ventricular hypertrophy. Left ventricular diastolic parameters are consistent with Grade I diastolic dysfunction (impaired relaxation). Right Ventricle: The right ventricular size is normal. No increase in right ventricular wall thickness. Right ventricular systolic function is normal. Left Atrium: Left atrial size was normal in size. Right Atrium: Right atrial size was normal in size. Pericardium: There is no evidence of pericardial effusion. Mitral Valve: The mitral valve is normal in structure. Mild mitral valve regurgitation. No evidence of mitral valve stenosis. Tricuspid Valve: The tricuspid valve is normal in structure. Tricuspid valve regurgitation is trivial. No evidence of tricuspid stenosis. Aortic Valve: The aortic valve is normal in structure. Aortic valve regurgitation is not visualized. No aortic stenosis is  present. Aortic valve mean gradient measures 2.5 mmHg. Aortic valve peak gradient measures 4.8 mmHg. Aortic valve area, by VTI measures 2.71 cm. Pulmonic Valve: The pulmonic valve was normal in structure. Pulmonic valve regurgitation is trivial. No evidence of pulmonic stenosis. Aorta: The aortic root is normal in size and structure. Venous: The inferior vena cava is normal in size with greater than 50% respiratory variability, suggesting right atrial pressure of 3 mmHg. IAS/Shunts: No atrial level shunt detected by color flow Doppler.  LEFT VENTRICLE PLAX  2D LVIDd:         4.30 cm  Diastology LVIDs:         3.10 cm  LV e' medial:    5.44 cm/s LV PW:         1.10 cm  LV E/e' medial:  12.9 LV IVS:        0.70 cm  LV e' lateral:   8.81 cm/s LVOT diam:     2.00 cm  LV E/e' lateral: 8.0 LV SV:         57 LV SV Index:   27 LVOT Area:     3.14 cm  RIGHT VENTRICLE RV Basal diam:  3.10 cm RV S prime:     16.40 cm/s TAPSE (M-mode): 4.8 cm LEFT ATRIUM             Index       RIGHT ATRIUM           Index LA diam:        4.10 cm 1.93 cm/m  RA Area:     18.90 cm LA Vol (A2C):   45.8 ml 21.59 ml/m RA Volume:   52.00 ml  24.52 ml/m LA Vol (A4C):   48.4 ml 22.82 ml/m LA Biplane Vol: 48.0 ml 22.63 ml/m  AORTIC VALVE AV Area (Vmax):    2.52 cm AV Area (Vmean):   2.61 cm AV Area (VTI):     2.71 cm AV Vmax:           110.00 cm/s AV Vmean:          72.450 cm/s AV VTI:            0.210 m AV Peak Grad:      4.8 mmHg AV Mean Grad:      2.5 mmHg LVOT Vmax:         88.10 cm/s LVOT Vmean:        60.300 cm/s LVOT VTI:          0.181 m LVOT/AV VTI ratio: 0.86 MITRAL VALVE               TRICUSPID VALVE MV Area (PHT): 2.92 cm    TR Peak grad:   12.8 mmHg MV Decel Time: 260 msec    TR Vmax:        179.00 cm/s MV E velocity: 70.30 cm/s MV A velocity: 83.10 cm/s  SHUNTS MV E/A ratio:  0.85        Systemic VTI:  0.18 m                            Systemic Diam: 2.00 cm Neoma Laming MD Electronically signed by Neoma Laming MD Signature  Date/Time: 03/01/2020/2:41:16 PM    Final       ASSESSMENT/PLAN   Acute hypoxemic respiratory failure -Remdesevir antiviral - pharmacy protocol 5 d -vitamin C -zinc -decadron 6mg  IV daily  - monitor UOP - utilize external urinary catheter if possible -Self prone if patient can tolerate  -encourage to use IS and Acapella device for bronchopulmonary hygiene when able -consider Actemra if CRP increments >20 -d/c hepatotoxic medications while on remdesevir -supportive care with ICU telemetry monitoring -PT/OT when possible -procalcitonin, CRP and ferritin trending - will DC Norvasc at this time to allow diuresis will initiate gentle 20 mg Lasix at this time due to CKD -We will continue with IV steroids Decadron 6 daily starting tomorrow -MRSA PCR nasal -Procalcitonin  trend -Respiratory culture expectorated sputum -Fungitell and Aspergillus antigen serum due to history of RA with immunosuppressive home regimen    Bibasilar atelectasis -Contributing to her increased O2 requirement and hypoxemia -We will order chest physiotherapy via [employer] -PT OT when able -have discussed with RN and she has kindly brought in IS and flutter valve and is encouraging patient to use regularly    Thank you for allowing me to participate in the care of this patient.    Patient/Family are satisfied with care plan and all questions have been answered.  This document was prepared using Dragon voice recognition software and may include unintentional dictation errors.     [name], M.D.  Division of [place]

## 2020-03-03 DIAGNOSIS — I10 Essential (primary) hypertension: Secondary | ICD-10-CM

## 2020-03-03 DIAGNOSIS — M069 Rheumatoid arthritis, unspecified: Secondary | ICD-10-CM

## 2020-03-03 LAB — CBC WITH DIFFERENTIAL/PLATELET
Abs Immature Granulocytes: 0.18 10*3/uL — ABNORMAL HIGH (ref 0.00–0.07)
Basophils Absolute: 0 10*3/uL (ref 0.0–0.1)
Basophils Relative: 0 %
Eosinophils Absolute: 0 10*3/uL (ref 0.0–0.5)
Eosinophils Relative: 0 %
HCT: 38 % — ABNORMAL LOW (ref 39.0–52.0)
Hemoglobin: 13.2 g/dL (ref 13.0–17.0)
Immature Granulocytes: 2 %
Lymphocytes Relative: 6 %
Lymphs Abs: 0.5 10*3/uL — ABNORMAL LOW (ref 0.7–4.0)
MCH: 32 pg (ref 26.0–34.0)
MCHC: 34.7 g/dL (ref 30.0–36.0)
MCV: 92.2 fL (ref 80.0–100.0)
Monocytes Absolute: 0.5 10*3/uL (ref 0.1–1.0)
Monocytes Relative: 6 %
Neutro Abs: 7.7 10*3/uL (ref 1.7–7.7)
Neutrophils Relative %: 86 %
Platelets: 195 10*3/uL (ref 150–400)
RBC: 4.12 MIL/uL — ABNORMAL LOW (ref 4.22–5.81)
RDW: 12.8 % (ref 11.5–15.5)
WBC: 8.9 10*3/uL (ref 4.0–10.5)
nRBC: 0 % (ref 0.0–0.2)

## 2020-03-03 LAB — COMPREHENSIVE METABOLIC PANEL
ALT: 19 U/L (ref 0–44)
AST: 24 U/L (ref 15–41)
Albumin: 3.3 g/dL — ABNORMAL LOW (ref 3.5–5.0)
Alkaline Phosphatase: 50 U/L (ref 38–126)
Anion gap: 12 (ref 5–15)
BUN: 55 mg/dL — ABNORMAL HIGH (ref 8–23)
CO2: 24 mmol/L (ref 22–32)
Calcium: 8.9 mg/dL (ref 8.9–10.3)
Chloride: 99 mmol/L (ref 98–111)
Creatinine, Ser: 1.74 mg/dL — ABNORMAL HIGH (ref 0.61–1.24)
GFR, Estimated: 39 mL/min — ABNORMAL LOW (ref >60)
Glucose, Bld: 159 mg/dL — ABNORMAL HIGH (ref 70–99)
Potassium: 4.4 mmol/L (ref 3.5–5.1)
Sodium: 135 mmol/L (ref 135–145)
Total Bilirubin: 0.6 mg/dL (ref 0.3–1.2)
Total Protein: 6.9 g/dL (ref 6.5–8.1)

## 2020-03-03 LAB — GLUCOSE, CAPILLARY
Glucose-Capillary: 158 mg/dL — ABNORMAL HIGH (ref 70–99)
Glucose-Capillary: 138 mg/dL — ABNORMAL HIGH (ref 70–99)
Glucose-Capillary: 122 mg/dL — ABNORMAL HIGH (ref 70–99)

## 2020-03-03 LAB — MAGNESIUM: Magnesium: 2.2 mg/dL (ref 1.7–2.4)

## 2020-03-03 LAB — PHOSPHORUS: Phosphorus: 5.5 mg/dL — ABNORMAL HIGH (ref 2.5–4.6)

## 2020-03-03 LAB — MRSA PCR SCREENING: MRSA by PCR: NEGATIVE

## 2020-03-03 LAB — FIBRIN DERIVATIVES D-DIMER (ARMC ONLY): Fibrin derivatives D-dimer (ARMC): 537.85 ng/mL (FEU) — ABNORMAL HIGH (ref 0.00–499.00)

## 2020-03-03 LAB — FERRITIN: Ferritin: 302 ng/mL (ref 24–336)

## 2020-03-03 LAB — C-REACTIVE PROTEIN: CRP: 4.5 mg/dL — ABNORMAL HIGH (ref <1.0)

## 2020-03-03 MED ORDER — METHYLPREDNISOLONE SODIUM SUCC 125 MG IJ SOLR
60.0000 mg | Freq: Two times a day (BID) | INTRAMUSCULAR | Status: DC
  Administered 2020-03-03 – 2020-03-04 (×3): 60 mg via INTRAVENOUS
  Filled 2020-03-03 (×3): qty 2

## 2020-03-03 NOTE — Progress Notes (Signed)
Pulmonary Medicine          Date: 03/03/2020,   MRN# [id number] Isaac Stevens 1935/03/28     AdmissionWeight: 99 kg                 CurrentWeight: 99 kg   Referring physician: Dr. Manuella Ghazi  CHIEF COMPLAINT:   Acute hypoxemic respiratory failure due to COVID-19   HISTORY OF PRESENT ILLNESS   This is a very pleasant 85 year old male who has a history of CKD 3, rheumatoid arthritis essential hypertension dyslipidemia came in with worsening shortness of breath over the last 1 week was found to be acutely positive for COVID-19 with hypoxemia.He denies having any fever or chills, no chest pain, no dizziness, no lightheadedness, no anorexia, no loss of taste, no loss of smell, no abdominal pain, no nausea, no vomiting, no diarrhea, no constipation, no urinary frequency, no dysuria, no nocturia, no headache. He is a former smoker but has not smoked in 30 years. He can walk on his own usually but has not been able to do that for 1 week since being sick  Patient is vaccinated for COVID-19 x1.  Patient was initiated on Decadron remdesivir per Covid protocol.  He was noted to have increased O2 requirement with progressively worsening over the last 24 hours and episodes of respiratory distress.  PCCM consultation placed for additional evaluation and management.  CT chest reviewed by me with groundglass opacification peripheral predominance consistent with COVID-19 pneumonitis without pneumothorax, consolidated infiltrate, significant pleural effusion.  03/03/20- patient is on 7L now , he is OOB to chair, states he is feeling better. Plan to continue current care, PT, OT, chest PT.   PAST MEDICAL HISTORY   Past Medical History:  Diagnosis Date  . CAFL (chronic airflow limitation) (HCC)   . CKD (chronic kidney disease)   . Diabetes mellitus without complication ([place])   . Hyperlipidemia   . Hypertension   . Rheumatoid arthritis ([place])      SURGICAL HISTORY   Past Surgical History:   Procedure Laterality Date  . TOE SURGERY     right foot     FAMILY HISTORY   Family History  Problem Relation Age of Onset  . Prostate cancer Father   . Parkinson's disease Mother      SOCIAL HISTORY   Social History   Tobacco Use  . Smoking status: Former Research scientist (life sciences)  . Smokeless tobacco: Current User    Types: Chew  . Tobacco comment: quit 20 years ago , chews tobacco  Substance Use Topics  . Alcohol use: No    Alcohol/week: 0.0 standard drinks  . Drug use: No     MEDICATIONS    Home Medication:    Current Medication:  Current Facility-Administered Medications:  .  0.9 %  sodium chloride infusion, 250 mL, Intravenous, PRN, Isaac, Tochukwu, MD .  acetaminophen (TYLENOL) tablet 650 mg, 650 mg, Oral, Q6H PRN, Isaac, Tochukwu, MD .  albumin human 25 % solution 12.5 g, 12.5 g, Intravenous, Daily, [name], MD .  allopurinol (ZYLOPRIM) tablet 100 mg, 100 mg, Oral, Daily, Isaac, Tochukwu, MD, 100 mg at 03/02/20 1159 .  aspirin EC tablet 81 mg, 81 mg, Oral, Daily, Isaac, Tochukwu, MD, 81 mg at 03/02/20 1109 .  atorvastatin (LIPITOR) tablet 40 mg, 40 mg, Oral, q1800, Isaac, Tochukwu, MD, 40 mg at 03/02/20 1619 .  baricitinib (OLUMIANT) tablet 2 mg, 2 mg, Oral, Daily, Manuella Ghazi, Vipul, MD, 2 mg at 03/02/20 1159 .  dexamethasone (DECADRON) injection 6 mg, 6 mg, Intravenous, Q24H, [name], [name], MD, 6 mg at 03/03/20 0103 .  enoxaparin (LOVENOX) injection 50 mg, 0.5 mg/kg, Subcutaneous, Q24H, Isaac, Tochukwu, MD, 50 mg at 03/02/20 2222 .  folic acid (FOLVITE) tablet 1 mg, 1 mg, Oral, Daily, Isaac, Tochukwu, MD, 1 mg at 03/02/20 1109 .  furosemide (LASIX) injection 20 mg, 20 mg, Intravenous, Daily, [name], [name], MD, 20 mg at 03/02/20 1623 .  guaiFENesin-dextromethorphan (ROBITUSSIN DM) 100-10 MG/5ML syrup 10 mL, 10 mL, Oral, Q4H PRN, Isaac, Tochukwu, MD .  hydroxychloroquine (PLAQUENIL) tablet 400 mg, 400 mg, Oral, Daily, Isaac, Tochukwu, MD, 400 mg at 03/02/20  1110 .  insulin aspart (novoLOG) injection 0-15 Units, 0-15 Units, Subcutaneous, TID WC, Isaac, Tochukwu, MD, 3 Units at 03/02/20 1621 .  insulin detemir (LEVEMIR) injection 7 Units, 0.075 Units/kg, Subcutaneous, BID, Isaac, Tochukwu, MD, 7 Units at 03/02/20 2222 .  ipratropium (ATROVENT HFA) inhaler 2 puff, 2 puff, Inhalation, Q6H, Isaac, Tochukwu, MD, 2 puff at 03/03/20 0104 .  loratadine (CLARITIN) tablet 10 mg, 10 mg, Oral, Daily, Isaac, Tochukwu, MD, 10 mg at 03/02/20 1109 .  ondansetron (ZOFRAN) tablet 4 mg, 4 mg, Oral, Q6H PRN **OR** ondansetron (ZOFRAN) injection 4 mg, 4 mg, Intravenous, Q6H PRN, Isaac, Tochukwu, MD .  Margrett Rud remdesivir 200 mg in sodium chloride 0.9% 250 mL IVPB, 200 mg, Intravenous, Once, Stopped at 03/01/20 1542 **FOLLOWED BY** remdesivir 100 mg in sodium chloride 0.9 % 100 mL IVPB, 100 mg, Intravenous, Daily, [name], MD, Last Rate: 200 mL/hr at 03/02/20 1109, 100 mg at 03/02/20 1109 .  sodium chloride flush (NS) 0.9 % injection 3 mL, 3 mL, Intravenous, Q12H, Isaac, Tochukwu, MD, 3 mL at 03/03/20 0104 .  sodium chloride flush (NS) 0.9 % injection 3 mL, 3 mL, Intravenous, PRN, Isaac, Tochukwu, MD .  sulfamethoxazole-trimethoprim (BACTRIM) 400-80 MG per tablet 1 tablet, 1 tablet, Oral, Q12H, [name], MD, 1 tablet at 03/02/20 2222 .  vitamin B-12 (CYANOCOBALAMIN) tablet 1,000 mcg, 1,000 mcg, Oral, Daily, Isaac, Tochukwu, MD, 1,000 mcg at 03/02/20 1110    ALLERGIES   Methotrexate derivatives, Lisinopril, Pseudoephedrine hcl, and Sitagliptin     REVIEW OF SYSTEMS    Review of Systems:  Gen:  Denies  fever, sweats, chills weigh loss  HEENT: Denies blurred vision, double vision, ear pain, eye pain, hearing loss, nose bleeds, sore throat Cardiac:  No dizziness, chest pain or heaviness, chest tightness,edema Resp:   Denies cough or sputum porduction, shortness of breath,wheezing, hemoptysis,  Gi: Denies swallowing difficulty, stomach  pain, nausea or vomiting, diarrhea, constipation, bowel incontinence Gu:  Denies bladder incontinence, burning urine Ext:   Denies Joint pain, stiffness or swelling Skin: Denies  skin rash, easy bruising or bleeding or hives Endoc:  Denies polyuria, polydipsia , polyphagia or weight change Psych:   Denies depression, insomnia or hallucinations   Other:  All other systems negative   VS: BP 123/72 (BP Location: Left Arm)   Pulse (!) 57   Temp 97.6 F (36.4 C) (Oral)   Resp 16   Ht 5\' 8"  (1.727 m)   Wt 99 kg   SpO2 94%   BMI 33.19 kg/m      PHYSICAL EXAM    GENERAL:NAD, no fevers, chills, no weakness no fatigue HEAD: Normocephalic, atraumatic.  EYES: Pupils equal, round, reactive to light. Extraocular muscles intact. No scleral icterus.  MOUTH: Moist mucosal membrane. Dentition intact. No abscess noted.  EAR, NOSE, THROAT: Clear without exudates. No external lesions.  NECK: Supple. No thyromegaly. No nodules. No JVD.  PULMONARY: Rhonchorous breath sounds bilaterally CARDIOVASCULAR: S1 and S2. Regular rate and rhythm. No murmurs, rubs, or gallops. No edema. Pedal pulses 2+ bilaterally.  GASTROINTESTINAL: Soft, nontender, nondistended. No masses. Positive bowel sounds. No hepatosplenomegaly.  MUSCULOSKELETAL: No swelling, clubbing, or edema. Range of motion full in all extremities.  NEUROLOGIC: Cranial nerves II through XII are intact. No gross focal neurological deficits. Sensation intact. Reflexes intact.  SKIN: No ulceration, lesions, rashes, or cyanosis. Skin warm and dry. Turgor intact.  PSYCHIATRIC: Mood, affect within normal limits. The patient is awake, alert and oriented x 3. Insight, judgment intact.       IMAGING       CT Angio Chest PE W and/or Wo Contrast  Result Date: 03/01/2020 CLINICAL DATA:  Shortness of breath. Positive D-dimer study. COVID-19 positive EXAM: CT ANGIOGRAPHY CHEST WITH CONTRAST TECHNIQUE: Multidetector CT imaging of the chest was  performed using the standard protocol during bolus administration of intravenous contrast. Multiplanar CT image reconstructions and MIPs were obtained to evaluate the vascular anatomy. CONTRAST:  80mL OMNIPAQUE IOHEXOL 350 MG/ML SOLN COMPARISON:  Chest radiograph March 01, 2020; chest CT July 14, 2015 FINDINGS: Cardiovascular: There is no demonstrable pulmonary embolus. Ascending thoracic aortic diameter measures 4.1 x 4.1 cm. There is no appreciable thoracic aortic dissection. There are scattered foci of calcification in visualized great vessels. Note that the right innominate and left common carotid arteries arise as a common trunk, an anatomic variant. There is aortic atherosclerosis. There are scattered foci of coronary artery calcification. There is no pericardial thickening. Minimal pericardial fluid is within physiologic range. Mediastinum/Nodes: Visualized thyroid appears unremarkable. There is no evident thoracic adenopathy. No esophageal lesions are appreciable. Lungs/Pleura: There is extensive multifocal airspace opacity bilaterally, overall more severe on the right than on the left, without appreciable airspace consolidation. No pleural effusions are evident. No appreciable pneumothorax. Trachea and major bronchial structures appear patent. Upper Abdomen: There is incomplete visualization of an apparent cyst arising from the upper pole of the right kidney posteriorly measuring 1.5 x 1.0 cm. The liver as a somewhat nodular contour suggesting underlying cirrhosis. There is upper abdominal aortic and mesenteric arterial vascular calcification. Musculoskeletal: There is degenerative change in the lower thoracic spine. There are no blastic or lytic bone lesions. No evident chest wall lesions. Review of the MIP images confirms the above findings. IMPRESSION: 1. No demonstrable pulmonary embolus. Ascending thoracic aortic diameter measures 4.1 x 4.1 cm. Recommend annual imaging followup by CTA or MRA. This  recommendation follows 2010 ACCF/AHA/AATS/ACR/ASA/SCA/SCAI/SIR/STS/SVM Guidelines for the Diagnosis and Management of Patients with Thoracic Aortic Disease. Circulation. 2010; [id number]. Aortic aneurysm NOS (ICD10-I71.9). No dissection. Foci of great vessel and coronary artery calcification noted. There is aortic atherosclerosis. 2. Multifocal airspace opacity, likely due to multifocal atypical organism pneumonia. No consolidation. 3.  No evident adenopathy. 4.  Contour of the liver suggests underlying hepatic cirrhosis. Aortic Atherosclerosis (ICD10-I70.0). Electronically Signed   By: Lowella Grip III M.D.   On: 03/01/2020 13:38   DG Chest Port 1 View  Result Date: 03/01/2020 CLINICAL DATA:  COVID positive. Increasing shortness of breath. Hypoxia. EXAM: PORTABLE CHEST 1 VIEW COMPARISON:  One-view chest x-ray 03/11/2015. FINDINGS: Heart size is normal. Bibasilar airspace opacities are present. Small effusions are present. IMPRESSION: 1. Bibasilar airspace disease is consistent with pneumonia. 2. Small bilateral pleural effusions. Electronically Signed   By: San Morelle M.D.   On: 03/01/2020 11:16   ECHOCARDIOGRAM COMPLETE  Result Date: 03/01/2020    ECHOCARDIOGRAM REPORT   Patient Name:   Isaac Stevens Date of Exam: 03/01/2020 Medical Rec #:  [id number]        Height:       68.0 in Accession #:    [id number]       Weight:       218.3 lb Date of Birth:  [birth year]        BSA:          2.121 m Patient Age:    57 years         BP:           104/61 mmHg Patient Gender: M                HR:           86 bpm. Exam Location:  [facility] Procedure: 2D Echo, Cardiac Doppler, Color Doppler and Intracardiac            Opacification Agent Indications:     CHF- acute diastolic [id number]  History:         Patient has prior history of Echocardiogram examinations, most                  recent 11/20/2018. Risk Factors:Hypertension and Diabetes.  Sonographer:     [name] RDCS (AE) Referring Phys:  [place] Diagnosing Phys: Neoma Laming MD  Sonographer Comments: Technically challenging study due to limited acoustic windows and Technically difficult study due to poor echo windows. IMPRESSIONS  1. Left ventricular ejection fraction, by estimation, is 50 to 55%. The left ventricle has low normal function. The left ventricle has no regional wall motion abnormalities. There is mild concentric left ventricular hypertrophy. Left ventricular diastolic parameters are consistent with Grade I diastolic dysfunction (impaired relaxation).  2. Right ventricular systolic function is normal. The right ventricular size is normal.  3. The mitral valve is normal in structure. Mild mitral valve regurgitation. No evidence of mitral stenosis.  4. The aortic valve is normal in structure. Aortic valve regurgitation is not visualized. No aortic stenosis is present.  5. The inferior vena cava is normal in size with greater than 50% respiratory variability, suggesting right atrial pressure of 3 mmHg. FINDINGS  Left Ventricle: Left ventricular ejection fraction, by estimation, is 50 to 55%. The left ventricle has low normal function. The left ventricle has no regional wall motion abnormalities. Definity contrast agent was given IV to delineate the left ventricular endocardial borders. The left ventricular internal cavity size was normal in size. There is mild concentric left ventricular hypertrophy. Left ventricular diastolic parameters are consistent with Grade I diastolic dysfunction (impaired relaxation). Right Ventricle: The right ventricular size is normal. No increase in right ventricular wall thickness. Right ventricular systolic function is normal. Left Atrium: Left atrial size was normal in size. Right Atrium: Right atrial size was normal in size. Pericardium: There is no evidence of pericardial effusion. Mitral Valve: The mitral valve is normal in structure. Mild mitral valve regurgitation. No evidence of mitral valve  stenosis. Tricuspid Valve: The tricuspid valve is normal in structure. Tricuspid valve regurgitation is trivial. No evidence of tricuspid stenosis. Aortic Valve: The aortic valve is normal in structure. Aortic valve regurgitation is not visualized. No aortic stenosis is present. Aortic valve mean gradient measures 2.5 mmHg. Aortic valve peak gradient measures 4.8 mmHg. Aortic valve area, by VTI measures 2.71 cm. Pulmonic Valve: The pulmonic valve was normal in structure. Pulmonic valve  regurgitation is trivial. No evidence of pulmonic stenosis. Aorta: The aortic root is normal in size and structure. Venous: The inferior vena cava is normal in size with greater than 50% respiratory variability, suggesting right atrial pressure of 3 mmHg. IAS/Shunts: No atrial level shunt detected by color flow Doppler.  LEFT VENTRICLE PLAX 2D LVIDd:         4.30 cm  Diastology LVIDs:         3.10 cm  LV e' medial:    5.44 cm/s LV PW:         1.10 cm  LV E/e' medial:  12.9 LV IVS:        0.70 cm  LV e' lateral:   8.81 cm/s LVOT diam:     2.00 cm  LV E/e' lateral: 8.0 LV SV:         57 LV SV Index:   27 LVOT Area:     3.14 cm  RIGHT VENTRICLE RV Basal diam:  3.10 cm RV S prime:     16.40 cm/s TAPSE (M-mode): 4.8 cm LEFT ATRIUM             Index       RIGHT ATRIUM           Index LA diam:        4.10 cm 1.93 cm/m  RA Area:     18.90 cm LA Vol (A2C):   45.8 ml 21.59 ml/m RA Volume:   52.00 ml  24.52 ml/m LA Vol (A4C):   48.4 ml 22.82 ml/m LA Biplane Vol: 48.0 ml 22.63 ml/m  AORTIC VALVE AV Area (Vmax):    2.52 cm AV Area (Vmean):   2.61 cm AV Area (VTI):     2.71 cm AV Vmax:           110.00 cm/s AV Vmean:          72.450 cm/s AV VTI:            0.210 m AV Peak Grad:      4.8 mmHg AV Mean Grad:      2.5 mmHg LVOT Vmax:         88.10 cm/s LVOT Vmean:        60.300 cm/s LVOT VTI:          0.181 m LVOT/AV VTI ratio: 0.86 MITRAL VALVE               TRICUSPID VALVE MV Area (PHT): 2.92 cm    TR Peak grad:   12.8 mmHg MV Decel Time:  260 msec    TR Vmax:        179.00 cm/s MV E velocity: 70.30 cm/s MV A velocity: 83.10 cm/s  SHUNTS MV E/A ratio:  0.85        Systemic VTI:  0.18 m                            Systemic Diam: 2.00 cm Neoma Laming MD Electronically signed by Neoma Laming MD Signature Date/Time: 03/01/2020/2:41:16 PM    Final       ASSESSMENT/PLAN   Acute hypoxemic respiratory failure -Remdesevir antiviral - pharmacy protocol 5 d -vitamin C -zinc -decadron 6mg  IV daily  - monitor UOP - utilize external urinary catheter if possible -Self prone if patient can tolerate  -encourage to use IS and Acapella device for bronchopulmonary hygiene when able -consider Actemra if CRP increments >20 -d/c hepatotoxic medications  while on remdesevir -supportive care with ICU telemetry monitoring -PT/OT when possible -procalcitonin, CRP and ferritin trending - will DC Norvasc at this time to allow diuresis will initiate gentle 20 mg Lasix at this time due to CKD -We will continue with IV steroids Decadron 6 daily starting tomorrow -MRSA PCR nasal -Procalcitonin trend -Respiratory culture expectorated sputum -Fungitell and Aspergillus antigen serum due to history of RA with immunosuppressive home regimen    Bibasilar atelectasis -Contributing to her increased O2 requirement and hypoxemia -We will order chest physiotherapy via [employer] -PT OT when able -have discussed with RN and she has kindly brought in IS and flutter valve and is encouraging patient to use regularly    Thank you for allowing me to participate in the care of this patient.    Patient/Family are satisfied with care plan and all questions have been answered.  This document was prepared using Dragon voice recognition software and may include unintentional dictation errors.     [name], M.D.  Division of [place]

## 2020-03-03 NOTE — Evaluation (Signed)
Occupational Therapy Evaluation Patient Details Name: QUINDARIUS CABELLO MRN: [id number] DOB: 06/19/1935 Today's Date: 03/03/2020    History of Present Illness 85yo Male who comes [facility] on 1/[facility] after worsening SOB at home. Pt tested (+) COVID 10 days prior. CTA negative for embolus, but revealing of ascending aorta of 4.1cmx4.1cm. Pt admitted c COVID PNA. PMH: COPD, DM, CKD3, RA, HTN, multiple CVA, RUE frozen shoulder (2021).   Clinical Impression   Pt seen for OT evaluation this date. Upon arrival to room, pt seated upright in bed, awake, and on 15L of O2 via HFNC. Pt reports receiving assistance from wife for dressing, bathing, cooking, and cleaning, and reports using a cane for functional mobility in the home. Pt reports using a wheelchair for community mobility. Of note, pt received outpatient OT in 2021 for RUE frozen shoulder; unable to attain goal of flexing shoulder >20 degrees and using R hand to perform ADLs independently upon discharge. Pt appears to be performing ADLs at baseline at this moment. This session, pt required MAX A for bed-level LB dressing; able to demo figure-4 position, but unable to don socks. Of note, SpO2 desat to 85% during LB dressing, able to recover following 1-min rest break and cues for PLB. Pt was provided education on bed-level UE therex, with pt able to perform x10 reps of adaptive chest presses, SpO2 81% following reps and able to recover to 90% following 1-min rest break and cues for PLB; RN informed. Pt left in bed, in no acute distress, with SpO2 92%. Physical therapy entered immediately following OT eval. Pt would benefit from additional inpatient OT services to prevent further deconditioning, maximize return to PLOF, and minimize risk of future falls, injury, caregiver burden, and readmission. Upon discharge, recommend home with 24/7 supervision.    Follow Up Recommendations  SNF    Equipment Recommendations  None recommended by OT       Precautions /  Restrictions Precautions Precautions: Fall Restrictions Weight Bearing Restrictions: No      Mobility Bed Mobility               General bed mobility comments: Able to independently perform bridge position for OT to manage lines    Transfers                 General transfer comment: OOB mobility deferred d/t desat (81-85%) during bed-level ADLs and UE therex        ADL either performed or assessed with clinical judgement   ADL Overall ADL's : Needs assistance/impaired                     Lower Body Dressing: Maximal assistance;Bed level Lower Body Dressing Details (indicate cue type and reason): To don socks; pt able to demo bed level figure-4 position. SpO2 desat 85% following initial attempt to don socks, resolved following seated 1-min rest break                                  Pertinent Vitals/Pain Pain Assessment: No/denies pain     Hand Dominance Right   Extremity/Trunk Assessment Upper Extremity Assessment Upper Extremity Assessment: RUE deficits/detail;Generalized weakness RUE Deficits / Details: Shoulder ROM grossly 0-80 degrees, able to reach at least 90 degrees of flexion with AAROM. Elbow ROM grossly 20-140, able to reach 0-160 with AAROM RUE Sensation: WNL RUE Coordination: decreased fine motor   Lower Extremity Assessment Lower  Extremity Assessment: Defer to PT evaluation       Communication Communication Communication: Expressive difficulties;HOH   Cognition Arousal/Alertness: Awake/alert Behavior During Therapy: WFL for tasks assessed/performed Overall Cognitive Status: Within Functional Limits for tasks assessed                                 General Comments: Pleasant and agreeable to session   General Comments  Pt on 15L of 02 via B and E, O2 desat following supine LB dressing (SpO2 85%) and seated UE therex (SpO2 81%)    Exercises Other Exercises Other Exercises: x10 adapted seated chest  press        Home Living Family/patient expects to be discharged to:: Private residence Living Arrangements: Spouse/significant other Available Help at Discharge: Family;Available 24 hours/day Type of Home: House Home Access: Ramped entrance     Home Layout: Two level;Able to live on main level with bedroom/bathroom Alternate Level Stairs-Number of Steps: basement, does not go down   Bathroom Shower/Tub: Walk-in shower;Other (comment) (pt reports sponge bathing on toilet at baseline)   Bathroom Toilet: [place]: Cane - quad;Bedside commode;Grab bars - toilet;Grab bars - tub/shower;Wheelchair - manual          Prior Functioning/Environment Level of Independence: Needs assistance  Gait / Transfers Assistance Needed: Per pt, uses a cane for functional mobility at home. Pt reports using a wheelchair for community mobility ADL's / Homemaking Assistance Needed: Per pt, wife helps with dressing, bathing, cooking, and cleaning.   Comments: Pt reports interest in fixing old cars, but has not be able to do it since stroke        OT Problem List: Decreased strength;Decreased range of motion;Decreased activity tolerance;Impaired balance (sitting and/or standing);Cardiopulmonary status limiting activity      OT Treatment/Interventions: Self-care/ADL training;Therapeutic exercise;Energy conservation;DME and/or AE instruction;Therapeutic activities;Patient/family education;Balance training    OT Goals(Current goals can be found in the care plan section) Acute Rehab OT Goals Patient Stated Goal: to get stronger OT Goal Formulation: With patient Time For Goal Achievement: 03/17/20 Potential to Achieve Goals: Good ADL Goals Pt Will Perform Grooming: with min guard assist;standing Pt Will Perform Upper Body Dressing: with modified independence;sitting Pt Will Transfer to Toilet: with supervision;ambulating;bedside commode  OT Frequency: Min 2X/week    AM-PAC OT "6  Clicks" Daily Activity     Outcome Measure Help from another person eating meals?: None Help from another person taking care of personal grooming?: A Little Help from another person toileting, which includes using toliet, bedpan, or urinal?: A Lot Help from another person bathing (including washing, rinsing, drying)?: A Lot Help from another person to put on and taking off regular upper body clothing?: A Little Help from another person to put on and taking off regular lower body clothing?: A Lot 6 Click Score: 16   End of Session Equipment Utilized During Treatment: Oxygen Nurse Communication: Mobility status  Activity Tolerance: Other (comment) (tx limited by SpO2) Patient left: in bed;with call bell/phone within reach;with bed alarm set  OT Visit Diagnosis: Unsteadiness on feet (R26.81);Muscle weakness (generalized) (M62.81)                Time: 1884-1660 OT Time Calculation (min): 18 min Charges:  OT General Charges $OT Visit: 1 Visit OT Evaluation $OT Eval Moderate Complexity: [place], OTR/L [place]

## 2020-03-03 NOTE — Progress Notes (Signed)
Patient ID: Isaac Stevens, male   DOB: [birth year], 85 y.o.   MRN: [id number]  PROGRESS NOTE    SORRELL APOSTLE  IZT:[id number] DOB: 03/15/1935 DOA: 03/01/2020 PCP: Lynnea Ferrier, MD   Brief Narrative:  85 year old male with history of COPD, diabetes mellitus type 2, chronic kidney disease stage III, rheumatoid arthritis, hypertension and dyslipidemia presented with worsening shortness of breath and was admitted for acute hypoxic respite failure due to Covid pneumonia.  He was hypoxic in the field with room air pulse oximetry in the 80s and required 5 L oxygen via nasal cannula with pulse ox of 94%.  During the hospitalization, respiratory status worsened requiring 15 L oxygen with nonrebreather mask.  Pulmonary was consulted.  Assessment & Plan:   Acute hypoxic respiratory failure COVID-19 pneumonia -Chest x-ray consistent with Covid pneumonia -Required 5 L oxygen on presentation but has worsened and is currently requiring 15 L oxygen with nonrebreather mask. -Continue steroids, remdesivir and baricitinib. -Pulmonary following.  Continue antitussives and proper dilators.  Patient has also been started on Bactrim by pulmonary. -Monitor inflammatory markers daily  Diabetes mellitus type 2 -Continue CBGs with SSI along with Levemir  Chronic kidney disease stage IIIa -Stable.  Monitor  Hypertension -Continue amlodipine  Rheumatoid arthritis -Continue Plaquenil.  Outpatient follow-up  Obesity -Outpatient follow-up    DVT prophylaxis: Lovenox Code Status: Full Family Communication: None Disposition Plan: Status is: Inpatient  Remains inpatient appropriate because:Inpatient level of care appropriate due to severity of illness   Dispo: The patient is from: Home              Anticipated d/c is to: Home              Anticipated d/c date is: > 3 days              Patient currently is not medically stable to d/c.   Difficult to place patient No   Consultants:  Pulmonary  Procedures: None  Antimicrobials: None   Subjective: Patient seen and examined at bedside.  Feels slightly better.  Still short of breath with exertion.  No overnight fever reported.  Objective: Vitals:   03/03/20 0519 03/03/20 0810 03/03/20 1155 03/03/20 1210  BP: 139/64 123/72  112/60  Pulse: (!) 56 (!) 57  66  Resp: 16 16  18   Temp: 97.7 F (36.5 C) 97.6 F (36.4 C)  97.7 F (36.5 C)  TempSrc: Oral Oral  Oral  SpO2: 97% 94% 93% 93%  Weight:      Height:        Intake/Output Summary (Last 24 hours) at 03/03/2020 1316 Last data filed at 03/03/2020 0516 Gross per 24 hour  Intake -  Output 800 ml  Net -800 ml   Filed Weights   03/01/20 1022  Weight: 99 kg    Examination:  General exam: Looks chronically ill.  Currently on 15 L oxygen via high flow nasal cannula Respiratory system: Bilateral decreased breath sounds at bases with scattered crackles Cardiovascular system: S1 & S2 heard, Rate controlled Gastrointestinal system: Abdomen is nondistended, soft and nontender. Normal bowel sounds heard. Extremities: No cyanosis, clubbing; trace lower extremity edema Central nervous system: Awake.  Poor historian.  Slow to respond.  No focal neurological deficits. Moving extremities Skin: No rashes, lesions or ulcers Psychiatry: Flat affect   Data Reviewed: I have personally reviewed following labs and imaging studies  CBC: Recent Labs  Lab 03/01/20 1019 03/02/20 0729 03/03/20 0756  WBC 7.8 4.0  8.9  NEUTROABS 6.1 3.4 7.7  HGB 13.7 13.0 13.2  HCT 39.5 37.6* 38.0*  MCV 93.2 94.2 92.2  PLT 191 150 726   Basic Metabolic Panel: Recent Labs  Lab 03/01/20 1019 03/02/20 0729 03/03/20 0756  NA 133* 133* 135  K 3.7 4.4 4.4  CL 94* 97* 99  CO2 26 23 24   GLUCOSE 104* 143* 159*  BUN 30* 37* 55*  CREATININE 1.73* 1.45* 1.74*  CALCIUM 9.0 8.6* 8.9  MG  --  2.1 2.2  PHOS  --  5.8* 5.5*   GFR: Estimated Creatinine Clearance: 37.3 mL/min (A) (by C-G  formula based on SCr of 1.74 mg/dL (H)). Liver Function Tests: Recent Labs  Lab 03/01/20 1019 03/02/20 0729 03/03/20 0756  AST 30 24 24   ALT 23 17 19   ALKPHOS 53 50 50  BILITOT 0.7 0.5 0.6  PROT 7.7 6.8 6.9  ALBUMIN 3.8 3.2* 3.3*   No results for input(s): LIPASE, AMYLASE in the last 168 hours. No results for input(s): AMMONIA in the last 168 hours. Coagulation Profile: No results for input(s): INR, PROTIME in the last 168 hours. Cardiac Enzymes: No results for input(s): CKTOTAL, CKMB, CKMBINDEX, TROPONINI in the last 168 hours. BNP (last 3 results) No results for input(s): PROBNP in the last 8760 hours. HbA1C: No results for input(s): HGBA1C in the last 72 hours. CBG: Recent Labs  Lab 03/02/20 0813 03/02/20 1714 03/02/20 2039 03/03/20 0812 03/03/20 1207  GLUCAP 152* 180* 185* 158* 138*   Lipid Profile: Recent Labs    03/01/20 1019  TRIG 77   Thyroid Function Tests: No results for input(s): TSH, T4TOTAL, FREET4, T3FREE, THYROIDAB in the last 72 hours. Anemia Panel: Recent Labs    03/02/20 0729 03/03/20 0756  FERRITIN 229 302   Sepsis Labs: Recent Labs  Lab 03/01/20 1019 03/01/20 1108 03/01/20 1246 03/02/20 1550  PROCALCITON 0.19  --   --  0.20  LATICACIDVEN  --  2.2* 1.4  --     Recent Results (from the past 240 hour(s))  Blood Culture (routine x 2)     Status: None (Preliminary result)   Collection Time: 03/01/20 11:08 AM   Specimen: BLOOD  Result Value Ref Range Status   Specimen Description BLOOD RIGHT [facility]  Final   Special Requests   Final    BOTTLES DRAWN AEROBIC AND ANAEROBIC Blood Culture adequate volume   Culture   Final    NO GROWTH 2 DAYS Performed at [facility], [address], [place], [place] [address]    Report Status PENDING  Incomplete  Blood Culture (routine x 2)     Status: None (Preliminary result)   Collection Time: 03/01/20 11:08 AM   Specimen: BLOOD  Result Value Ref Range Status   Specimen Description BLOOD  RIGHT [facility]  Final   Special Requests   Final    BOTTLES DRAWN AEROBIC AND ANAEROBIC Blood Culture adequate volume   Culture   Final    NO GROWTH 2 DAYS Performed at [facility], [address], [place], [place] [address]    Report Status PENDING  Incomplete  MRSA PCR Screening     Status: None   Collection Time: 03/03/20  2:42 AM   Specimen: Nasopharyngeal  Result Value Ref Range Status   MRSA by PCR NEGATIVE NEGATIVE Final    Comment:        The GeneXpert MRSA Assay (FDA approved for NASAL specimens only), is one component of a comprehensive MRSA colonization  surveillance program. It is not intended to diagnose MRSA infection nor to guide or monitor treatment for MRSA infections. Performed at [facility], [address] [place], [place] [address]          Radiology Studies: CT Angio Chest PE W and/or Wo Contrast  Result Date: 03/01/2020 CLINICAL DATA:  Shortness of breath. Positive D-dimer study. COVID-19 positive EXAM: CT ANGIOGRAPHY CHEST WITH CONTRAST TECHNIQUE: Multidetector CT imaging of the chest was performed using the standard protocol during bolus administration of intravenous contrast. Multiplanar CT image reconstructions and MIPs were obtained to evaluate the vascular anatomy. CONTRAST:  56mL OMNIPAQUE IOHEXOL 350 MG/ML SOLN COMPARISON:  Chest radiograph March 01, 2020; chest CT July 14, 2015 FINDINGS: Cardiovascular: There is no demonstrable pulmonary embolus. Ascending thoracic aortic diameter measures 4.1 x 4.1 cm. There is no appreciable thoracic aortic dissection. There are scattered foci of calcification in visualized great vessels. Note that the right innominate and left common carotid arteries arise as a common trunk, an anatomic variant. There is aortic atherosclerosis. There are scattered foci of coronary artery calcification. There is no pericardial thickening. Minimal pericardial fluid is within physiologic range. Mediastinum/Nodes:  Visualized thyroid appears unremarkable. There is no evident thoracic adenopathy. No esophageal lesions are appreciable. Lungs/Pleura: There is extensive multifocal airspace opacity bilaterally, overall more severe on the right than on the left, without appreciable airspace consolidation. No pleural effusions are evident. No appreciable pneumothorax. Trachea and major bronchial structures appear patent. Upper Abdomen: There is incomplete visualization of an apparent cyst arising from the upper pole of the right kidney posteriorly measuring 1.5 x 1.0 cm. The liver as a somewhat nodular contour suggesting underlying cirrhosis. There is upper abdominal aortic and mesenteric arterial vascular calcification. Musculoskeletal: There is degenerative change in the lower thoracic spine. There are no blastic or lytic bone lesions. No evident chest wall lesions. Review of the MIP images confirms the above findings. IMPRESSION: 1. No demonstrable pulmonary embolus. Ascending thoracic aortic diameter measures 4.1 x 4.1 cm. Recommend annual imaging followup by CTA or MRA. This recommendation follows 2010 ACCF/AHA/AATS/ACR/ASA/SCA/SCAI/SIR/STS/SVM Guidelines for the Diagnosis and Management of Patients with Thoracic Aortic Disease. Circulation. 2010; [id number]. Aortic aneurysm NOS (ICD10-I71.9). No dissection. Foci of great vessel and coronary artery calcification noted. There is aortic atherosclerosis. 2. Multifocal airspace opacity, likely due to multifocal atypical organism pneumonia. No consolidation. 3.  No evident adenopathy. 4.  Contour of the liver suggests underlying hepatic cirrhosis. Aortic Atherosclerosis (ICD10-I70.0). Electronically Signed   By: Lowella Grip III M.D.   On: 03/01/2020 13:38   ECHOCARDIOGRAM COMPLETE  Result Date: 03/01/2020    ECHOCARDIOGRAM REPORT   Patient Name:   DVON KUDELKA Date of Exam: 03/01/2020 Medical Rec #:  [id number]        Height:       68.0 in Accession #:    [id number]        Weight:       218.3 lb Date of Birth:  [birth year]        BSA:          2.121 m Patient Age:    61 years         BP:           104/61 mmHg Patient Gender: M                HR:           86 bpm. Exam Location:  [facility] Procedure: 2D Echo, Cardiac Doppler,  Color Doppler and Intracardiac            Opacification Agent Indications:     CHF- acute diastolic [id number]  History:         Patient has prior history of Echocardiogram examinations, most                  recent 11/20/2018. Risk Factors:Hypertension and Diabetes.  Sonographer:     [name] RDCS (AE) Referring Phys:  [place] Diagnosing Phys: Neoma Laming MD  Sonographer Comments: Technically challenging study due to limited acoustic windows and Technically difficult study due to poor echo windows. IMPRESSIONS  1. Left ventricular ejection fraction, by estimation, is 50 to 55%. The left ventricle has low normal function. The left ventricle has no regional wall motion abnormalities. There is mild concentric left ventricular hypertrophy. Left ventricular diastolic parameters are consistent with Grade I diastolic dysfunction (impaired relaxation).  2. Right ventricular systolic function is normal. The right ventricular size is normal.  3. The mitral valve is normal in structure. Mild mitral valve regurgitation. No evidence of mitral stenosis.  4. The aortic valve is normal in structure. Aortic valve regurgitation is not visualized. No aortic stenosis is present.  5. The inferior vena cava is normal in size with greater than 50% respiratory variability, suggesting right atrial pressure of 3 mmHg. FINDINGS  Left Ventricle: Left ventricular ejection fraction, by estimation, is 50 to 55%. The left ventricle has low normal function. The left ventricle has no regional wall motion abnormalities. Definity contrast agent was given IV to delineate the left ventricular endocardial borders. The left ventricular internal cavity size was normal in size. There is mild  concentric left ventricular hypertrophy. Left ventricular diastolic parameters are consistent with Grade I diastolic dysfunction (impaired relaxation). Right Ventricle: The right ventricular size is normal. No increase in right ventricular wall thickness. Right ventricular systolic function is normal. Left Atrium: Left atrial size was normal in size. Right Atrium: Right atrial size was normal in size. Pericardium: There is no evidence of pericardial effusion. Mitral Valve: The mitral valve is normal in structure. Mild mitral valve regurgitation. No evidence of mitral valve stenosis. Tricuspid Valve: The tricuspid valve is normal in structure. Tricuspid valve regurgitation is trivial. No evidence of tricuspid stenosis. Aortic Valve: The aortic valve is normal in structure. Aortic valve regurgitation is not visualized. No aortic stenosis is present. Aortic valve mean gradient measures 2.5 mmHg. Aortic valve peak gradient measures 4.8 mmHg. Aortic valve area, by VTI measures 2.71 cm. Pulmonic Valve: The pulmonic valve was normal in structure. Pulmonic valve regurgitation is trivial. No evidence of pulmonic stenosis. Aorta: The aortic root is normal in size and structure. Venous: The inferior vena cava is normal in size with greater than 50% respiratory variability, suggesting right atrial pressure of 3 mmHg. IAS/Shunts: No atrial level shunt detected by color flow Doppler.  LEFT VENTRICLE PLAX 2D LVIDd:         4.30 cm  Diastology LVIDs:         3.10 cm  LV e' medial:    5.44 cm/s LV PW:         1.10 cm  LV E/e' medial:  12.9 LV IVS:        0.70 cm  LV e' lateral:   8.81 cm/s LVOT diam:     2.00 cm  LV E/e' lateral: 8.0 LV SV:         57 LV SV Index:   27 LVOT  Area:     3.14 cm  RIGHT VENTRICLE RV Basal diam:  3.10 cm RV S prime:     16.40 cm/s TAPSE (M-mode): 4.8 cm LEFT ATRIUM             Index       RIGHT ATRIUM           Index LA diam:        4.10 cm 1.93 cm/m  RA Area:     18.90 cm LA Vol (A2C):   45.8 ml  21.59 ml/m RA Volume:   52.00 ml  24.52 ml/m LA Vol (A4C):   48.4 ml 22.82 ml/m LA Biplane Vol: 48.0 ml 22.63 ml/m  AORTIC VALVE AV Area (Vmax):    2.52 cm AV Area (Vmean):   2.61 cm AV Area (VTI):     2.71 cm AV Vmax:           110.00 cm/s AV Vmean:          72.450 cm/s AV VTI:            0.210 m AV Peak Grad:      4.8 mmHg AV Mean Grad:      2.5 mmHg LVOT Vmax:         88.10 cm/s LVOT Vmean:        60.300 cm/s LVOT VTI:          0.181 m LVOT/AV VTI ratio: 0.86 MITRAL VALVE               TRICUSPID VALVE MV Area (PHT): 2.92 cm    TR Peak grad:   12.8 mmHg MV Decel Time: 260 msec    TR Vmax:        179.00 cm/s MV E velocity: 70.30 cm/s MV A velocity: 83.10 cm/s  SHUNTS MV E/A ratio:  0.85        Systemic VTI:  0.18 m                            Systemic Diam: 2.00 cm Neoma Laming MD Electronically signed by Neoma Laming MD Signature Date/Time: 03/01/2020/2:41:16 PM    Final         Scheduled Meds: . allopurinol  100 mg Oral Daily  . aspirin EC  81 mg Oral Daily  . atorvastatin  40 mg Oral q1800  . baricitinib  2 mg Oral Daily  . dexamethasone (DECADRON) injection  6 mg Intravenous Q24H  . enoxaparin (LOVENOX) injection  0.5 mg/kg Subcutaneous Q24H  . folic acid  1 mg Oral Daily  . furosemide  20 mg Intravenous Daily  . hydroxychloroquine  400 mg Oral Daily  . insulin aspart  0-15 Units Subcutaneous TID WC  . insulin detemir  0.075 Units/kg Subcutaneous BID  . ipratropium  2 puff Inhalation Q6H  . loratadine  10 mg Oral Daily  . sodium chloride flush  3 mL Intravenous Q12H  . sulfamethoxazole-trimethoprim  1 tablet Oral Q12H  . vitamin B-12  1,000 mcg Oral Daily   Continuous Infusions: . sodium chloride    . albumin human 12.5 g (03/03/20 1003)  . remdesivir 100 mg in NS 100 mL 100 mg (03/03/20 0957)          Aline August, MD [facility] 03/03/2020, 1:16 PM

## 2020-03-03 NOTE — Evaluation (Signed)
Physical Therapy Evaluation Patient Details Name: Isaac Stevens MRN: [id number] DOB: 05-Nov-1935 Today's Date: 03/03/2020   History of Present Illness  Isaac Stevens is an 45yoM who comes to [facility] on 1/24 after worsening SOB at home. Pt tested (+) COVID 10 days prior. CTA negative for embolus, but revealing of ascending aorta of 4.1cmx4.1cm. Pt admitted c COVID PNA. PMH: COPD, DM, CKD3, RA, HTN, multiple CVA, RUE frozen shoulder (2021).  Clinical Impression  Pt admitted with above diagnosis. Pt currently with functional limitations due to the deficits listed below (see "PT Problem List"). Upon entry, pt in bed, awake and agreeable to participate. The pt is alert, pleasant, interactive, and able to provide info regarding prior level of function, both in tolerance and independence. PT familiar to author from [place] this year. Pt on HFNC @ 15L/min, which DOES NOT meet saturation requirements with sitting up, standing for micturation drops to 84%. Pt AMB on 30L (HFN+NRB) maintains sats well. Pt left on 15L HFNC+10L NRB at exit, sats 92-94%. Author added sterile water to bubbler prior to exit, levels very low upon entry. Patient's performance this date reveals decreased ability, independence, and tolerance in performing all basic mobility required for performance of activities of daily living. Pt requires additional DME, close physical assistance, and cues for safe participate in mobility. Pt will benefit from skilled PT intervention to increase independence and safety with basic mobility in preparation for discharge to the venue listed below.       Follow Up Recommendations Home health PT;Supervision for mobility/OOB    Equipment Recommendations  None recommended by PT    Recommendations for Other Services       Precautions / Restrictions Precautions Precautions: Fall Restrictions Weight Bearing Restrictions: No      Mobility  Bed Mobility Overal bed mobility: Modified Independent              General bed mobility comments: Able to independently perform bridge position for OT to manage lines    Transfers Overall transfer level: Needs assistance   Transfers: Sit to/from Stand;Stand Pivot Transfers Sit to Stand: Min guard Stand pivot transfers: Min guard       General transfer comment: baseline balance deficits and hemi weakness, no acute exacerbation, well aware of limitations,  Ambulation/Gait   Gait Distance (Feet): 30 Feet Assistive device: Rolling walker (2 wheeled) Gait Pattern/deviations: WFL(Within Functional Limits)     General Gait Details: alternate FWD, backward at bedside; distance restricted by O2 tubing length. Maintained on 30L (HFNC + NRB), sats at 95% after AMB.  Stairs            Wheelchair Mobility    Modified Rankin (Stroke Patients Only)       Balance Overall balance assessment: Modified Independent;Mild deficits observed, not formally tested;History of Falls                                           Pertinent Vitals/Pain Pain Assessment: No/denies pain    Home Living Family/patient expects to be discharged to:: Private residence Living Arrangements: Spouse/significant other Available Help at Discharge: Family;Available 24 hours/day Type of Home: House Home Access: Ramped entrance     Home Layout: Two level;Able to live on main level with bedroom/bathroom Home Equipment: Cane - quad;Bedside commode;Grab bars - toilet;Grab bars - tub/shower;Wheelchair - manual      Prior Function Level of Independence: Needs  assistance   Gait / Transfers Assistance Needed: Per pt, uses a cane for functional mobility at home. Pt reports using a wheelchair for community mobility  ADL's / Homemaking Assistance Needed: Per pt, wife helps with dressing, bathing, cooking, and cleaning.  Comments: Pt reports interest in fixing old cars, but has not be able to do it since stroke     Hand Dominance   Dominant Hand:  Right    Extremity/Trunk Assessment   Upper Extremity Assessment Upper Extremity Assessment: RUE deficits/detail;Generalized weakness RUE Deficits / Details: Shoulder ROM grossly 0-80 degrees, able to reach at least 90 degrees of flexion with AAROM. Elbow ROM grossly 20-140, able to reach 0-160 with AAROM RUE Sensation: WNL RUE Coordination: decreased fine motor    Lower Extremity Assessment Lower Extremity Assessment: Defer to PT evaluation       Communication   Communication: Expressive difficulties;HOH  Cognition Arousal/Alertness: Awake/alert Behavior During Therapy: WFL for tasks assessed/performed Overall Cognitive Status: Within Functional Limits for tasks assessed                                 General Comments: Pleasant and agreeable to session      General Comments General comments (skin integrity, edema, etc.): Pt on 15L of 02 via [place], O2 desat following supine LB dressing (SpO2 85%) and seated UE therex (SpO2 81%)    Exercises Other Exercises Other Exercises: x10 adapted seated chest press   Assessment/Plan    PT Assessment Patient needs continued PT services  PT Problem List Decreased activity tolerance;Decreased balance;Decreased mobility;Cardiopulmonary status limiting activity       PT Treatment Interventions DME instruction;Balance training;Gait training;Stair training;Functional mobility training;Therapeutic activities;Therapeutic exercise;Patient/family education    PT Goals (Current goals can be found in the Care Plan section)  Acute Rehab PT Goals Patient Stated Goal: be able to breathe PT Goal Formulation: With patient Time For Goal Achievement: 03/17/20 Potential to Achieve Goals: Good    Frequency Min 2X/week   Barriers to discharge        Co-evaluation               AM-PAC PT "6 Clicks" Mobility  Outcome Measure Help needed turning from your back to your side while in a flat bed without using bedrails?: A  Little Help needed moving from lying on your back to sitting on the side of a flat bed without using bedrails?: A Little Help needed moving to and from a bed to a chair (including a wheelchair)?: A Little Help needed standing up from a chair using your arms (e.g., wheelchair or bedside chair)?: A Little Help needed to walk in hospital room?: A Little Help needed climbing 3-5 steps with a railing? : A Lot 6 Click Score: 17    End of Session Equipment Utilized During Treatment: Oxygen Activity Tolerance: Patient tolerated treatment well Patient left: in chair;with call bell/phone within reach Nurse Communication: Mobility status PT Visit Diagnosis: Unsteadiness on feet (R26.81);Other abnormalities of gait and mobility (R26.89);Muscle weakness (generalized) (M62.81)    Time: 6270-3500 PT Time Calculation (min) (ACUTE ONLY): 36 min   Charges:   PT Evaluation $PT Eval Moderate Complexity: 1 Mod PT Treatments $Therapeutic Exercise: 8-22 mins        12:45 PM, 03/03/20 Etta Grandchild, PT, DPT Physical Therapist - [facility]  [phone] ([place])    Dandrae Kustra C 03/03/2020, 12:32 PM

## 2020-03-03 NOTE — Progress Notes (Signed)
Inpatient Diabetes Program Recommendations  AACE/ADA: New Consensus Statement on Inpatient Glycemic Control (2015)  Target Ranges:  Prepandial:   less than 140 mg/dL      Peak postprandial:   less than 180 mg/dL (1-2 hours)      Critically ill patients:  140 - 180 mg/dL   Lab Results  Component Value Date   GLUCAP 158 (H) 03/03/2020   HGBA1C 6.1 (H) 11/19/2018    Review of Glycemic Control Results for MERCY, MALENA (MRN [id number]) as of 03/03/2020 11:05  Ref. Range 03/02/2020 17:14 03/02/2020 20:39 03/03/2020 08:12  Glucose-Capillary Latest Ref Range: 70 - 99 mg/dL 180 (H) 185 (H) 158 (H)   Diabetes history: DM 2 Outpatient Diabetes medications: Metformin 1000 mg bid Current orders for Inpatient glycemic control:  Novolog moderate tid with meals Levemir 7 units bid Decadron 6 mg daily Inpatient Diabetes Program Recommendations:   Agree with current orders. Will follow.   Thanks,  Adah Perl, RN, BC-ADM Inpatient Diabetes Coordinator Pager [phone] (8a-5p)

## 2020-03-04 ENCOUNTER — Encounter: Payer: Self-pay | Admitting: Internal Medicine

## 2020-03-04 DIAGNOSIS — Z7189 Other specified counseling: Secondary | ICD-10-CM

## 2020-03-04 DIAGNOSIS — Z515 Encounter for palliative care: Secondary | ICD-10-CM

## 2020-03-04 LAB — CBC WITH DIFFERENTIAL/PLATELET
Abs Immature Granulocytes: 0.09 10*3/uL — ABNORMAL HIGH (ref 0.00–0.07)
Basophils Absolute: 0 10*3/uL (ref 0.0–0.1)
Basophils Relative: 0 %
Eosinophils Absolute: 0 10*3/uL (ref 0.0–0.5)
Eosinophils Relative: 0 %
HCT: 37.7 % — ABNORMAL LOW (ref 39.0–52.0)
Hemoglobin: 13.2 g/dL (ref 13.0–17.0)
Immature Granulocytes: 1 %
Lymphocytes Relative: 4 %
Lymphs Abs: 0.3 10*3/uL — ABNORMAL LOW (ref 0.7–4.0)
MCH: 32.1 pg (ref 26.0–34.0)
MCHC: 35 g/dL (ref 30.0–36.0)
MCV: 91.7 fL (ref 80.0–100.0)
Monocytes Absolute: 0.3 10*3/uL (ref 0.1–1.0)
Monocytes Relative: 4 %
Neutro Abs: 7 10*3/uL (ref 1.7–7.7)
Neutrophils Relative %: 91 %
Platelets: 183 10*3/uL (ref 150–400)
RBC: 4.11 MIL/uL — ABNORMAL LOW (ref 4.22–5.81)
RDW: 12.6 % (ref 11.5–15.5)
WBC: 7.6 10*3/uL (ref 4.0–10.5)
nRBC: 0 % (ref 0.0–0.2)

## 2020-03-04 LAB — COMPREHENSIVE METABOLIC PANEL
ALT: 23 U/L (ref 0–44)
AST: 31 U/L (ref 15–41)
Albumin: 3.4 g/dL — ABNORMAL LOW (ref 3.5–5.0)
Alkaline Phosphatase: 48 U/L (ref 38–126)
Anion gap: 11 (ref 5–15)
BUN: 59 mg/dL — ABNORMAL HIGH (ref 8–23)
CO2: 23 mmol/L (ref 22–32)
Calcium: 8.7 mg/dL — ABNORMAL LOW (ref 8.9–10.3)
Chloride: 99 mmol/L (ref 98–111)
Creatinine, Ser: 1.65 mg/dL — ABNORMAL HIGH (ref 0.61–1.24)
GFR, Estimated: 41 mL/min — ABNORMAL LOW (ref >60)
Glucose, Bld: 173 mg/dL — ABNORMAL HIGH (ref 70–99)
Potassium: 4.7 mmol/L (ref 3.5–5.1)
Sodium: 133 mmol/L — ABNORMAL LOW (ref 135–145)
Total Bilirubin: 0.6 mg/dL (ref 0.3–1.2)
Total Protein: 6.8 g/dL (ref 6.5–8.1)

## 2020-03-04 LAB — C-REACTIVE PROTEIN: CRP: 2.5 mg/dL — ABNORMAL HIGH (ref <1.0)

## 2020-03-04 LAB — GLUCOSE, CAPILLARY
Glucose-Capillary: 150 mg/dL — ABNORMAL HIGH (ref 70–99)
Glucose-Capillary: 153 mg/dL — ABNORMAL HIGH (ref 70–99)
Glucose-Capillary: 171 mg/dL — ABNORMAL HIGH (ref 70–99)
Glucose-Capillary: 186 mg/dL — ABNORMAL HIGH (ref 70–99)
Glucose-Capillary: 123 mg/dL — ABNORMAL HIGH (ref 70–99)

## 2020-03-04 LAB — MAGNESIUM: Magnesium: 2.3 mg/dL (ref 1.7–2.4)

## 2020-03-04 LAB — PHOSPHORUS: Phosphorus: 4.7 mg/dL — ABNORMAL HIGH (ref 2.5–4.6)

## 2020-03-04 LAB — FERRITIN: Ferritin: 276 ng/mL (ref 24–336)

## 2020-03-04 LAB — FIBRIN DERIVATIVES D-DIMER (ARMC ONLY): Fibrin derivatives D-dimer (ARMC): 516.86 ng/mL (FEU) — ABNORMAL HIGH (ref 0.00–499.00)

## 2020-03-04 MED ORDER — FUROSEMIDE 10 MG/ML IJ SOLN: 20.0000 mg | Freq: Once | INTRAMUSCULAR | Status: DC

## 2020-03-04 MED ORDER — TAMSULOSIN HCL 0.4 MG PO CAPS
0.4000 mg | ORAL_CAPSULE | Freq: Every day | ORAL | Status: DC
  Administered 2020-03-04: 0.4 mg via ORAL
  Filled 2020-03-04: qty 1

## 2020-03-04 MED ORDER — FUROSEMIDE 10 MG/ML IJ SOLN: 40.0000 mg | Freq: Every day | INTRAMUSCULAR | Status: DC

## 2020-03-04 NOTE — Progress Notes (Signed)
DR. Starla Link MADE AWARE, PATIENT LEFT AMA, PAPERS SIGNED AND PLACED IN MEDICAL CHART, PATIENT TOOK BELONGINGS, IV REMOVED FROM LEFT AND RIGHT AC. WITNESSED AND SIGNED ON THIS DATE AT 1822. LOA WITH SON TO HOME , ON 10L N.C SON BROUGHT HIS W/C AND HIS OXYGEN

## 2020-03-04 NOTE — Progress Notes (Signed)
Pulmonary Medicine          Date: 03/04/2020,   MRN# [id number] TRAFTON BIGGIO [birth year]     AdmissionWeight: 99 kg                 CurrentWeight: 99 kg   Referring physician: Dr. Manuella Ghazi  CHIEF COMPLAINT:   Acute hypoxemic respiratory failure due to COVID-19   HISTORY OF PRESENT ILLNESS   This is a very pleasant 85 year old male who has a history of CKD 3, rheumatoid arthritis essential hypertension dyslipidemia came in with worsening shortness of breath over the last 1 week was found to be acutely positive for COVID-19 with hypoxemia.He denies having any fever or chills, no chest pain, no dizziness, no lightheadedness, no anorexia, no loss of taste, no loss of smell, no abdominal pain, no nausea, no vomiting, no diarrhea, no constipation, no urinary frequency, no dysuria, no nocturia, no headache. He is a former smoker but has not smoked in 30 years. He can walk on his own usually but has not been able to do that for 1 week since being sick  Patient is vaccinated for COVID-19 x1.  Patient was initiated on Decadron remdesivir per Covid protocol.  He was noted to have increased O2 requirement with progressively worsening over the last 24 hours and episodes of respiratory distress.  PCCM consultation placed for additional evaluation and management.  CT chest reviewed by me with groundglass opacification peripheral predominance consistent with COVID-19 pneumonitis without pneumothorax, consolidated infiltrate, significant pleural effusion.  03/03/20- patient is on 7L now , he is OOB to chair, states he is feeling better. Plan to continue current care, PT, OT, chest PT.   03/04/20-patient was evaluated bedside, he continues to remove his nasal cannula with episodes of significant desaturation.  Today his oxygen requirement has been increased to 10 L/min on nasal cannula and he also has a facemask nonrebreather supplemental oxygen.  I spoke to his son Aaron Edelman today and explained that  patient has been somewhat noncompliant and we have encouraged him to continue to wear his oxygen therapy.  Patient has asked if he could be discharged home and I have explained that at this time it is unsafe and have encouraged him to continue to be patient and work with Therapist, sports and PT to optimize his hospital course and medical care.  PAST MEDICAL HISTORY   Past Medical History:  Diagnosis Date  . CAFL (chronic airflow limitation) (HCC)   . CKD (chronic kidney disease)   . Diabetes mellitus without complication ([place])   . Hyperlipidemia   . Hypertension   . Rheumatoid arthritis ([place])      SURGICAL HISTORY   Past Surgical History:  Procedure Laterality Date  . TOE SURGERY     right foot     FAMILY HISTORY   Family History  Problem Relation Age of Onset  . Prostate cancer Father   . Parkinson's disease Mother      SOCIAL HISTORY   Social History   Tobacco Use  . Smoking status: Former Research scientist (life sciences)  . Smokeless tobacco: Current User    Types: Chew  . Tobacco comment: quit 20 years ago , chews tobacco  Substance Use Topics  . Alcohol use: No    Alcohol/week: 0.0 standard drinks  . Drug use: No     MEDICATIONS    Home Medication:    Current Medication:  Current Facility-Administered Medications:  .  0.9 %  sodium chloride infusion, 250 mL, Intravenous,  PRN, Agbata, Tochukwu, MD .  acetaminophen (TYLENOL) tablet 650 mg, 650 mg, Oral, Q6H PRN, Agbata, Tochukwu, MD .  albumin human 25 % solution 12.5 g, 12.5 g, Intravenous, Daily, Shanise Balch, MD, Last Rate: 60 mL/hr at 03/04/20 0926, 12.5 g at 03/04/20 0926 .  allopurinol (ZYLOPRIM) tablet 100 mg, 100 mg, Oral, Daily, Agbata, Tochukwu, MD, 100 mg at 03/04/20 0915 .  aspirin EC tablet 81 mg, 81 mg, Oral, Daily, Agbata, Tochukwu, MD, 81 mg at 03/04/20 0914 .  atorvastatin (LIPITOR) tablet 40 mg, 40 mg, Oral, q1800, Agbata, Tochukwu, MD, 40 mg at 03/03/20 1743 .  baricitinib (OLUMIANT) tablet 2 mg, 2 mg, Oral, Daily,  Delfino Lovett, MD, 2 mg at 03/04/20 0915 .  enoxaparin (LOVENOX) injection 50 mg, 0.5 mg/kg, Subcutaneous, Q24H, Agbata, Tochukwu, MD, 50 mg at 03/03/20 2233 .  folic acid (FOLVITE) tablet 1 mg, 1 mg, Oral, Daily, Agbata, Tochukwu, MD, 1 mg at 03/04/20 0914 .  furosemide (LASIX) injection 20 mg, 20 mg, Intravenous, Once, Alekh, Kshitiz, MD .  Melene Muller ON 03/05/2020] furosemide (LASIX) injection 40 mg, 40 mg, Intravenous, Daily, Alekh, Kshitiz, MD .  guaiFENesin-dextromethorphan (ROBITUSSIN DM) 100-10 MG/5ML syrup 10 mL, 10 mL, Oral, Q4H PRN, Agbata, Tochukwu, MD .  hydroxychloroquine (PLAQUENIL) tablet 400 mg, 400 mg, Oral, Daily, Agbata, Tochukwu, MD, 400 mg at 03/04/20 0914 .  insulin aspart (novoLOG) injection 0-15 Units, 0-15 Units, Subcutaneous, TID WC, Agbata, Tochukwu, MD, 3 Units at 03/04/20 0919 .  insulin detemir (LEVEMIR) injection 7 Units, 0.075 Units/kg, Subcutaneous, BID, Agbata, Tochukwu, MD, 7 Units at 03/04/20 0916 .  ipratropium (ATROVENT HFA) inhaler 2 puff, 2 puff, Inhalation, Q6H, Agbata, Tochukwu, MD, 2 puff at 03/04/20 0912 .  loratadine (CLARITIN) tablet 10 mg, 10 mg, Oral, Daily, Agbata, Tochukwu, MD, 10 mg at 03/04/20 0914 .  methylPREDNISolone sodium succinate (SOLU-MEDROL) 125 mg/2 mL injection 60 mg, 60 mg, Intravenous, Q12H, Alekh, Kshitiz, MD, 60 mg at 03/04/20 0243 .  ondansetron (ZOFRAN) tablet 4 mg, 4 mg, Oral, Q6H PRN **OR** ondansetron (ZOFRAN) injection 4 mg, 4 mg, Intravenous, Q6H PRN, Agbata, Tochukwu, MD .  [address] remdesivir 200 mg in sodium chloride 0.9% 250 mL IVPB, 200 mg, Intravenous, Once, Stopped at 03/01/20 1542 **FOLLOWED BY** remdesivir 100 mg in sodium chloride 0.9 % 100 mL IVPB, 100 mg, Intravenous, Daily, Willy Eddy, MD, Last Rate: 200 mL/hr at 03/04/20 1045, 100 mg at 03/04/20 1045 .  sodium chloride flush (NS) 0.9 % injection 3 mL, 3 mL, Intravenous, Q12H, Agbata, Tochukwu, MD, 3 mL at 03/04/20 0928 .  sodium chloride flush (NS) 0.9 %  injection 3 mL, 3 mL, Intravenous, PRN, Agbata, Tochukwu, MD .  sulfamethoxazole-trimethoprim (BACTRIM) 400-80 MG per tablet 1 tablet, 1 tablet, Oral, Q12H, Vida Rigger, MD, 1 tablet at 03/04/20 0915 .  vitamin B-12 (CYANOCOBALAMIN) tablet 1,000 mcg, 1,000 mcg, Oral, Daily, Agbata, Tochukwu, MD, 1,000 mcg at 03/04/20 0914    ALLERGIES   Methotrexate derivatives, Lisinopril, Pseudoephedrine hcl, and Sitagliptin     REVIEW OF SYSTEMS    Review of Systems:  Gen:  Denies  fever, sweats, chills weigh loss  HEENT: Denies blurred vision, double vision, ear pain, eye pain, hearing loss, nose bleeds, sore throat Cardiac:  No dizziness, chest pain or heaviness, chest tightness,edema Resp:   Denies cough or sputum porduction, shortness of breath,wheezing, hemoptysis,  Gi: Denies swallowing difficulty, stomach pain, nausea or vomiting, diarrhea, constipation, bowel incontinence Gu:  Denies bladder incontinence, burning urine Ext:   Denies Joint pain,  stiffness or swelling Skin: Denies  skin rash, easy bruising or bleeding or hives Endoc:  Denies polyuria, polydipsia , polyphagia or weight change Psych:   Denies depression, insomnia or hallucinations   Other:  All other systems negative   VS: BP 135/72 (BP Location: Left Arm)   Pulse 60   Temp 97.8 F (36.6 C) (Oral)   Resp (!) 22   Ht 5\' 8"  (1.727 m)   Wt 99 kg   SpO2 92%   BMI 33.19 kg/m      PHYSICAL EXAM    GENERAL:NAD, no fevers, chills, no weakness no fatigue HEAD: Normocephalic, atraumatic.  EYES: Pupils equal, round, reactive to light. Extraocular muscles intact. No scleral icterus.  MOUTH: Moist mucosal membrane. Dentition intact. No abscess noted.  EAR, NOSE, THROAT: Clear without exudates. No external lesions.  NECK: Supple. No thyromegaly. No nodules. No JVD.  PULMONARY: Rhonchorous breath sounds bilaterally CARDIOVASCULAR: S1 and S2. Regular rate and rhythm. No murmurs, rubs, or gallops. No edema. Pedal  pulses 2+ bilaterally.  GASTROINTESTINAL: Soft, nontender, nondistended. No masses. Positive bowel sounds. No hepatosplenomegaly.  MUSCULOSKELETAL: No swelling, clubbing, or edema. Range of motion full in all extremities.  NEUROLOGIC: Cranial nerves II through XII are intact. No gross focal neurological deficits. Sensation intact. Reflexes intact.  SKIN: No ulceration, lesions, rashes, or cyanosis. Skin warm and dry. Turgor intact.  PSYCHIATRIC: Mood, affect within normal limits. The patient is awake, alert and oriented x 3. Insight, judgment intact.       IMAGING       CT Angio Chest PE W and/or Wo Contrast  Result Date: 03/01/2020 CLINICAL DATA:  Shortness of breath. Positive D-dimer study. COVID-19 positive EXAM: CT ANGIOGRAPHY CHEST WITH CONTRAST TECHNIQUE: Multidetector CT imaging of the chest was performed using the standard protocol during bolus administration of intravenous contrast. Multiplanar CT image reconstructions and MIPs were obtained to evaluate the vascular anatomy. CONTRAST:  86mL OMNIPAQUE IOHEXOL 350 MG/ML SOLN COMPARISON:  Chest radiograph March 01, 2020; chest CT July 14, 2015 FINDINGS: Cardiovascular: There is no demonstrable pulmonary embolus. Ascending thoracic aortic diameter measures 4.1 x 4.1 cm. There is no appreciable thoracic aortic dissection. There are scattered foci of calcification in visualized great vessels. Note that the right innominate and left common carotid arteries arise as a common trunk, an anatomic variant. There is aortic atherosclerosis. There are scattered foci of coronary artery calcification. There is no pericardial thickening. Minimal pericardial fluid is within physiologic range. Mediastinum/Nodes: Visualized thyroid appears unremarkable. There is no evident thoracic adenopathy. No esophageal lesions are appreciable. Lungs/Pleura: There is extensive multifocal airspace opacity bilaterally, overall more severe on the right than on the left,  without appreciable airspace consolidation. No pleural effusions are evident. No appreciable pneumothorax. Trachea and major bronchial structures appear patent. Upper Abdomen: There is incomplete visualization of an apparent cyst arising from the upper pole of the right kidney posteriorly measuring 1.5 x 1.0 cm. The liver as a somewhat nodular contour suggesting underlying cirrhosis. There is upper abdominal aortic and mesenteric arterial vascular calcification. Musculoskeletal: There is degenerative change in the lower thoracic spine. There are no blastic or lytic bone lesions. No evident chest wall lesions. Review of the MIP images confirms the above findings. IMPRESSION: 1. No demonstrable pulmonary embolus. Ascending thoracic aortic diameter measures 4.1 x 4.1 cm. Recommend annual imaging followup by CTA or MRA. This recommendation follows 2010 ACCF/AHA/AATS/ACR/ASA/SCA/SCAI/SIR/STS/SVM Guidelines for the Diagnosis and Management of Patients with Thoracic Aortic Disease. Circulation. 2010; 121: X324-M010. Aortic  aneurysm NOS (ICD10-I71.9). No dissection. Foci of great vessel and coronary artery calcification noted. There is aortic atherosclerosis. 2. Multifocal airspace opacity, likely due to multifocal atypical organism pneumonia. No consolidation. 3.  No evident adenopathy. 4.  Contour of the liver suggests underlying hepatic cirrhosis. Aortic Atherosclerosis (ICD10-I70.0). Electronically Signed   By: Lowella Grip III M.D.   On: 03/01/2020 13:38   DG Chest Port 1 View  Result Date: 03/01/2020 CLINICAL DATA:  COVID positive. Increasing shortness of breath. Hypoxia. EXAM: PORTABLE CHEST 1 VIEW COMPARISON:  One-view chest x-ray 03/11/2015. FINDINGS: Heart size is normal. Bibasilar airspace opacities are present. Small effusions are present. IMPRESSION: 1. Bibasilar airspace disease is consistent with pneumonia. 2. Small bilateral pleural effusions. Electronically Signed   By: San Morelle M.D.    On: 03/01/2020 11:16   ECHOCARDIOGRAM COMPLETE  Result Date: 03/01/2020    ECHOCARDIOGRAM REPORT   Patient Name:   MARTHA SOLTYS Date of Exam: 03/01/2020 Medical Rec #:  [id number]        Height:       68.0 in Accession #:    [id number]       Weight:       218.3 lb Date of Birth:  1935-10-04        BSA:          2.121 m Patient Age:    61 years         BP:           104/61 mmHg Patient Gender: M                HR:           86 bpm. Exam Location:  [facility] Procedure: 2D Echo, Cardiac Doppler, Color Doppler and Intracardiac            Opacification Agent Indications:     CHF- acute diastolic I69.62  History:         Patient has prior history of Echocardiogram examinations, most                  recent 11/20/2018. Risk Factors:Hypertension and Diabetes.  Sonographer:     [name] RDCS (AE) Referring Phys:  [place] Diagnosing Phys: Neoma Laming MD  Sonographer Comments: Technically challenging study due to limited acoustic windows and Technically difficult study due to poor echo windows. IMPRESSIONS  1. Left ventricular ejection fraction, by estimation, is 50 to 55%. The left ventricle has low normal function. The left ventricle has no regional wall motion abnormalities. There is mild concentric left ventricular hypertrophy. Left ventricular diastolic parameters are consistent with Grade I diastolic dysfunction (impaired relaxation).  2. Right ventricular systolic function is normal. The right ventricular size is normal.  3. The mitral valve is normal in structure. Mild mitral valve regurgitation. No evidence of mitral stenosis.  4. The aortic valve is normal in structure. Aortic valve regurgitation is not visualized. No aortic stenosis is present.  5. The inferior vena cava is normal in size with greater than 50% respiratory variability, suggesting right atrial pressure of 3 mmHg. FINDINGS  Left Ventricle: Left ventricular ejection fraction, by estimation, is 50 to 55%. The left ventricle has low  normal function. The left ventricle has no regional wall motion abnormalities. Definity contrast agent was given IV to delineate the left ventricular endocardial borders. The left ventricular internal cavity size was normal in size. There is mild concentric left ventricular hypertrophy. Left ventricular diastolic parameters are consistent with Grade  I diastolic dysfunction (impaired relaxation). Right Ventricle: The right ventricular size is normal. No increase in right ventricular wall thickness. Right ventricular systolic function is normal. Left Atrium: Left atrial size was normal in size. Right Atrium: Right atrial size was normal in size. Pericardium: There is no evidence of pericardial effusion. Mitral Valve: The mitral valve is normal in structure. Mild mitral valve regurgitation. No evidence of mitral valve stenosis. Tricuspid Valve: The tricuspid valve is normal in structure. Tricuspid valve regurgitation is trivial. No evidence of tricuspid stenosis. Aortic Valve: The aortic valve is normal in structure. Aortic valve regurgitation is not visualized. No aortic stenosis is present. Aortic valve mean gradient measures 2.5 mmHg. Aortic valve peak gradient measures 4.8 mmHg. Aortic valve area, by VTI measures 2.71 cm. Pulmonic Valve: The pulmonic valve was normal in structure. Pulmonic valve regurgitation is trivial. No evidence of pulmonic stenosis. Aorta: The aortic root is normal in size and structure. Venous: The inferior vena cava is normal in size with greater than 50% respiratory variability, suggesting right atrial pressure of 3 mmHg. IAS/Shunts: No atrial level shunt detected by color flow Doppler.  LEFT VENTRICLE PLAX 2D LVIDd:         4.30 cm  Diastology LVIDs:         3.10 cm  LV e' medial:    5.44 cm/s LV PW:         1.10 cm  LV E/e' medial:  12.9 LV IVS:        0.70 cm  LV e' lateral:   8.81 cm/s LVOT diam:     2.00 cm  LV E/e' lateral: 8.0 LV SV:         57 LV SV Index:   27 LVOT Area:     3.14  cm  RIGHT VENTRICLE RV Basal diam:  3.10 cm RV S prime:     16.40 cm/s TAPSE (M-mode): 4.8 cm LEFT ATRIUM             Index       RIGHT ATRIUM           Index LA diam:        4.10 cm 1.93 cm/m  RA Area:     18.90 cm LA Vol (A2C):   45.8 ml 21.59 ml/m RA Volume:   52.00 ml  24.52 ml/m LA Vol (A4C):   48.4 ml 22.82 ml/m LA Biplane Vol: 48.0 ml 22.63 ml/m  AORTIC VALVE AV Area (Vmax):    2.52 cm AV Area (Vmean):   2.61 cm AV Area (VTI):     2.71 cm AV Vmax:           110.00 cm/s AV Vmean:          72.450 cm/s AV VTI:            0.210 m AV Peak Grad:      4.8 mmHg AV Mean Grad:      2.5 mmHg LVOT Vmax:         88.10 cm/s LVOT Vmean:        60.300 cm/s LVOT VTI:          0.181 m LVOT/AV VTI ratio: 0.86 MITRAL VALVE               TRICUSPID VALVE MV Area (PHT): 2.92 cm    TR Peak grad:   12.8 mmHg MV Decel Time: 260 msec    TR Vmax:        179.00 cm/s  MV E velocity: 70.30 cm/s MV A velocity: 83.10 cm/s  SHUNTS MV E/A ratio:  0.85        Systemic VTI:  0.18 m                            Systemic Diam: 2.00 cm Neoma Laming MD Electronically signed by Neoma Laming MD Signature Date/Time: 03/01/2020/2:41:16 PM    Final       ASSESSMENT/PLAN   Acute hypoxemic respiratory failure -Remdesevir antiviral - pharmacy protocol 5 d -vitamin C -zinc -decadron 6mg  IV daily  - monitor UOP - utilize external urinary catheter if possible -Self prone if patient can tolerate  -encourage to use IS and Acapella device for bronchopulmonary hygiene when able -consider Actemra if CRP increments >20 -d/c hepatotoxic medications while on remdesevir -supportive care with ICU telemetry monitoring -PT/OT when possible -procalcitonin, CRP and ferritin trending -MRSA PCR nasal -Procalcitonin trend -Respiratory culture expectorated sputum -Fungitell and Aspergillus antigen serum due to history of RA with immunosuppressive home regimen  03/04/2020-patient diuresed with total -1800 on fluid balance, I will go up on Lasix from  20-40 daily and continue once daily albumin infusion.  Since his oxygen requirement has increased overnight we will leave on Solu-Medrol 60 twice daily for now, inflammatory biomarkers are trending down slightly, renal function with stable interval GFR.  Continuing COVID therapy baricitinib and remdesivir    Bibasilar atelectasis -Contributing to her increased O2 requirement and hypoxemia -We will order chest physiotherapy via [place] -PT OT when able -have discussed with RN and she has kindly brought in IS and flutter valve and is encouraging patient to use regularly    Thank you for allowing me to participate in the care of this patient.    Patient/Family are satisfied with care plan and all questions have been answered.  This document was prepared using Dragon voice recognition software and may include unintentional dictation errors.     [name], M.D.  Division of [place]

## 2020-03-04 NOTE — TOC Initial Note (Addendum)
Transition of Care [facility]) - Initial/Assessment Note    Patient Details  Name: Isaac Stevens MRN: [id number] Date of Birth: [birth year]  Transition of Care [facility]) CM/SW Contact:    Magnus Ivan, LCSW Phone Number: 03/04/2020, 1:34 PM  Clinical Narrative:         Patient on Airborne Precautions. Attempted call into patient's room, no asnwer.  CSW called patient's son Leroy Sea per RN request. Spoke to patient's son and wife via phone. Completed Readmission Risk Screening. Confirmed home address in chart is correct. Leroy Sea reported patient lives with his wife and son lives nearby. PCP is Dr. Caryl Comes but they plan to switch to Dr. Jimmye Norman in [place] [phone]) and requested patient's chart be updated. [facility]. Patient has a RW, w/c, nebulizer, tub bench, and grab bars at home. [facility] in the past. No SNF history. CSW explained HH recommendation from PT/OT. Patient and wife agreeable to [facility] and would like [facility] if possible. Referral accepted by [place] for RN, PT, and OT services. Brad agreeable to home o2 being ordered when needed. CSW will follow for sat note.  Leroy Sea reported he would like patient discharged today or tomorrow by lunch time "at the latest." CSW explained that per rounds patient is on a high amount of oxygen and not medically stable to DC yet. Leroy Sea stated he wants patient's Remdesivir and "all" other medications be stopped other than his oxygen. He said he wanted to make sure patient gets his nebulizers and inhalers. Leroy Sea was adamant  he wants patient discharged as soon as possible, tomorrow at the latest. CSW informed Leroy Sea that CSW will relay this to patient's MD for follow up. Informed Attending MD, Pulmonary MD, and LPN.   Expected Discharge Plan: [place] Barriers to Discharge: Continued Medical Work up   Patient Goals and CMS Choice Patient states their goals for this hospitalization and ongoing recovery are:: home with  home health CMS Medicare.gov Compare Post Acute Care list provided to:: Patient Represenative (must comment) Choice offered to / list presented to : Adult Children  Expected Discharge Plan and Services Expected Discharge Plan: [place]       Living arrangements for the past 2 months: [place] Arranged: PT,OT,RN [place]: [facility] Date [place]: 03/04/20   Representative spoke with at [place]: Jana Half  Prior Living Arrangements/Services Living arrangements for the past 2 months: [place] Lives with:: Spouse Patient language and need for interpreter reviewed:: Yes Do you feel safe going back to the place where you live?: Yes      Need for Family Participation in Patient Care: Yes (Comment) Care giver support system in place?: Yes (comment) Current home services: DME Criminal Activity/Legal Involvement Pertinent to Current Situation/Hospitalization: No - Comment as needed  Activities of Daily Living Home Assistive Devices/Equipment: Cane (specify quad or straight) ADL Screening (condition at time of admission) Patient's cognitive ability adequate to safely complete daily activities?: Yes Is the patient deaf or have difficulty hearing?: Yes Does the patient have difficulty seeing, even when wearing glasses/contacts?: No Does the patient have difficulty concentrating, remembering, or making decisions?: No Patient able to express need for assistance with ADLs?: Yes Does the patient have difficulty dressing or bathing?: Yes Independently performs  ADLs?: No Communication: Independent Dressing (OT): Needs assistance Is this a change from baseline?: Pre-admission baseline Grooming: Independent Feeding: Independent Bathing: Needs assistance Is this a change from baseline?: Pre-admission baseline Toileting: Needs assistance Is this a change from baseline?: Pre-admission  baseline In/Out Bed: Needs assistance Is this a change from baseline?: Pre-admission baseline Walks in Home: Independent with device (comment) Does the patient have difficulty walking or climbing stairs?: Yes Weakness of Legs: Right Weakness of Arms/Hands: Right  Permission Sought/Granted Permission sought to share information with : Chartered certified accountant granted to share information with : Yes, Verbal Permission Granted     Permission granted to share info w AGENCY: HH, DME agencies        Emotional Assessment         Alcohol / Substance Use: Not Applicable Psych Involvement: No (comment)  Admission diagnosis:  Acute respiratory failure with hypoxia ([place]) [J96.01] COVID-19 virus infection [U07.1] Pneumonia due to COVID-19 virus [U07.1, J12.82] Patient Active Problem List   Diagnosis Date Noted  . Pneumonia due to COVID-19 virus 03/01/2020  . Type 2 diabetes mellitus with stage 3 chronic kidney disease ([place]) 03/01/2020  . Acute respiratory failure due to COVID-19 ([place]) 03/01/2020  . Rheumatoid arthritis ([place]) 03/01/2020  . Essential hypertension 03/01/2020  . Stroke ([place]) 11/19/2018  . Syncope 10/21/2015  . Drug-induced pneumonitis 04/02/2015  . COPD (chronic obstructive pulmonary disease) ([place]) 04/02/2015  . Dyspnea 04/02/2015  . COPD exacerbation ([place]) 03/11/2015  . CAFL (chronic airflow limitation) ([place]) 08/14/2014  . HLD (hyperlipidemia) 08/14/2014  . BP (high blood pressure) 08/14/2014  . Diabetes mellitus type 2, uncontrolled ([place]) 08/14/2014  . Chronic kidney disease (CKD), stage III (moderate) ([place]) 01/27/2014  . 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 [place] [address] Phone: [phone] Fax: [phone]     Social Determinants of Health (SDOH) Interventions    Readmission Risk Interventions Readmission Risk  Prevention Plan 03/04/2020  Transportation Screening Complete  PCP or Specialist Appt within 3-5 Days Complete  HRI or [place] Complete  Social Work Consult for [place] Planning/Counseling Complete  Palliative Care Screening Complete  Medication Review Press photographer) Complete  Some recent data might be hidden

## 2020-03-04 NOTE — Progress Notes (Signed)
Patient ID: Isaac Stevens, male   DOB: 09-02-1935, 85 y.o.   MRN: [id number]  PROGRESS NOTE    Isaac Stevens  NWG:[id number] DOB: 1935-12-22 DOA: 03/01/2020 PCP: Adin Hector, MD   Brief Narrative:  85 year old male with history of COPD, diabetes mellitus type 2, chronic kidney disease stage III, rheumatoid arthritis, hypertension and dyslipidemia presented with worsening shortness of breath and was admitted for acute hypoxic respite failure due to Covid pneumonia.  He was hypoxic in the field with room air pulse oximetry in the 80s and required 5 L oxygen via nasal cannula with pulse ox of 94%.  During the hospitalization, respiratory status worsened requiring 15 L oxygen with nonrebreather mask.  Pulmonary was consulted.  Assessment & Plan:   Acute hypoxic respiratory failure COVID-19 pneumonia -Chest x-ray consistent with Covid pneumonia -Required 5 L oxygen on presentation; respiratory status slightly improving: Currently on 10 L oxygen via nasal cannula -Continue steroids, remdesivir and baricitinib. -Pulmonary following.  Continue antitussives and bronchodilators.  Patient has also been started on Bactrim by pulmonary. -Monitor inflammatory markers daily  Diabetes mellitus type 2 -Continue CBGs with SSI along with Levemir  Chronic kidney disease stage IIIa -Stable.  Monitor  Hypertension -Continue amlodipine  Rheumatoid arthritis -Continue Plaquenil.  Outpatient follow-up  Obesity -Outpatient follow-up    DVT prophylaxis: Lovenox Code Status: Full Family Communication: None Disposition Plan: Status is: Inpatient  Remains inpatient appropriate because:Inpatient level of care appropriate due to severity of illness.  Still requiring significant amount of oxygen   Dispo: The patient is from: Home              Anticipated d/c is to: Home              Anticipated d/c date is: > 3 days              Patient currently is not medically stable to d/c.   Difficult  to place patient No   Consultants: Pulmonary  Procedures: None  Antimicrobials: None   Subjective: Patient seen and examined at bedside.  Poor historian.  No overnight fever, vomiting reported.  Short of breath with even minimal exertion with some cough. Objective: Vitals:   03/03/20 1210 03/03/20 1557 03/03/20 2040 03/03/20 2239  BP: 112/60 104/73 140/69 126/68  Pulse: 66 62 (!) 58 (!) 58  Resp: 18 17  18   Temp: 97.7 F (36.5 C) (!) 97.5 F (36.4 C) 97.8 F (36.6 C) 97.9 F (36.6 C)  TempSrc: Oral  Oral   SpO2: 93% 96% 93% 91%  Weight:      Height:        Intake/Output Summary (Last 24 hours) at 03/04/2020 0733 Last data filed at 03/04/2020 0445 Gross per 24 hour  Intake 200 ml  Output 600 ml  Net -400 ml   Filed Weights   03/01/20 1022  Weight: 99 kg    Examination:  General exam: Chronically ill looking.  Currently on 10 L oxygen via nasal cannula Respiratory system: Decreased breath sounds at bases bilaterally with crackles Cardiovascular system: Intermittently bradycardic, S1-S2 heard  gastrointestinal system: Abdomen is nondistended, soft and nontender.  Bowel sounds are heard  extremities: Mild lower extremity edema present; no cyanosis Central nervous system: Alert, still slow to respond.  Poor historian.  No focal neurological deficits.  Moves extremities Skin: No obvious ecchymosis/lesions Psychiatry: Affect is flat   Data Reviewed: I have personally reviewed following labs and imaging studies  CBC: Recent Labs  Lab 03/01/20 1019 03/02/20 0729 03/03/20 0756 03/04/20 0659  WBC 7.8 4.0 8.9 7.6  NEUTROABS 6.1 3.4 7.7 7.0  HGB 13.7 13.0 13.2 13.2  HCT 39.5 37.6* 38.0* 37.7*  MCV 93.2 94.2 92.2 91.7  PLT 191 150 195 875   Basic Metabolic Panel: Recent Labs  Lab 03/01/20 1019 03/02/20 0729 03/03/20 0756  NA 133* 133* 135  K 3.7 4.4 4.4  CL 94* 97* 99  CO2 26 23 24   GLUCOSE 104* 143* 159*  BUN 30* 37* 55*  CREATININE 1.73* 1.45* 1.74*   CALCIUM 9.0 8.6* 8.9  MG  --  2.1 2.2  PHOS  --  5.8* 5.5*   GFR: Estimated Creatinine Clearance: 37.3 mL/min (A) (by C-G formula based on SCr of 1.74 mg/dL (H)). Liver Function Tests: Recent Labs  Lab 03/01/20 1019 03/02/20 0729 03/03/20 0756  AST 30 24 24   ALT 23 17 19   ALKPHOS 53 50 50  BILITOT 0.7 0.5 0.6  PROT 7.7 6.8 6.9  ALBUMIN 3.8 3.2* 3.3*   No results for input(s): LIPASE, AMYLASE in the last 168 hours. No results for input(s): AMMONIA in the last 168 hours. Coagulation Profile: No results for input(s): INR, PROTIME in the last 168 hours. Cardiac Enzymes: No results for input(s): CKTOTAL, CKMB, CKMBINDEX, TROPONINI in the last 168 hours. BNP (last 3 results) No results for input(s): PROBNP in the last 8760 hours. HbA1C: No results for input(s): HGBA1C in the last 72 hours. CBG: Recent Labs  Lab 03/03/20 0812 03/03/20 1207 03/03/20 1704 03/03/20 2214 03/03/20 2243  GLUCAP 158* 138* 122* 150* 153*   Lipid Profile: Recent Labs    03/01/20 1019  TRIG 77   Thyroid Function Tests: No results for input(s): TSH, T4TOTAL, FREET4, T3FREE, THYROIDAB in the last 72 hours. Anemia Panel: Recent Labs    03/02/20 0729 03/03/20 0756  FERRITIN 229 302   Sepsis Labs: Recent Labs  Lab 03/01/20 1019 03/01/20 1108 03/01/20 1246 03/02/20 1550  PROCALCITON 0.19  --   --  0.20  LATICACIDVEN  --  2.2* 1.4  --     Recent Results (from the past 240 hour(s))  Blood Culture (routine x 2)     Status: None (Preliminary result)   Collection Time: 03/01/20 11:08 AM   Specimen: BLOOD  Result Value Ref Range Status   Specimen Description BLOOD RIGHT [facility]  Final   Special Requests   Final    BOTTLES DRAWN AEROBIC AND ANAEROBIC Blood Culture adequate volume   Culture   Final    NO GROWTH 3 DAYS Performed at [facility], [address], [place], [place] [address]    Report Status PENDING  Incomplete  Blood Culture (routine x 2)     Status: None  (Preliminary result)   Collection Time: 03/01/20 11:08 AM   Specimen: BLOOD  Result Value Ref Range Status   Specimen Description BLOOD RIGHT [facility]  Final   Special Requests   Final    BOTTLES DRAWN AEROBIC AND ANAEROBIC Blood Culture adequate volume   Culture   Final    NO GROWTH 3 DAYS Performed at [facility], [address], [place], [place] [address]    Report Status PENDING  Incomplete  MRSA PCR Screening     Status: None   Collection Time: 03/03/20  2:42 AM   Specimen: Nasopharyngeal  Result Value Ref Range Status   MRSA by PCR NEGATIVE NEGATIVE Final    Comment:  The GeneXpert MRSA Assay (FDA approved for NASAL specimens only), is one component of a comprehensive MRSA colonization surveillance program. It is not intended to diagnose MRSA infection nor to guide or monitor treatment for MRSA infections. Performed at [facility], [address], [place], [place] [address]          Radiology Studies: No results found.      Scheduled Meds: . allopurinol  100 mg Oral Daily  . aspirin EC  81 mg Oral Daily  . atorvastatin  40 mg Oral q1800  . baricitinib  2 mg Oral Daily  . enoxaparin (LOVENOX) injection  0.5 mg/kg Subcutaneous Q24H  . folic acid  1 mg Oral Daily  . furosemide  20 mg Intravenous Daily  . hydroxychloroquine  400 mg Oral Daily  . insulin aspart  0-15 Units Subcutaneous TID WC  . insulin detemir  0.075 Units/kg Subcutaneous BID  . ipratropium  2 puff Inhalation Q6H  . loratadine  10 mg Oral Daily  . methylPREDNISolone (SOLU-MEDROL) injection  60 mg Intravenous Q12H  . sodium chloride flush  3 mL Intravenous Q12H  . sulfamethoxazole-trimethoprim  1 tablet Oral Q12H  . vitamin B-12  1,000 mcg Oral Daily   Continuous Infusions: . sodium chloride    . albumin human 12.5 g (03/03/20 1003)  . remdesivir 100 mg in NS 100 mL Stopped (03/03/20 1028)          Aline August, MD [facility] 03/04/2020, 7:33 AM

## 2020-03-04 NOTE — Consult Note (Signed)
Consultation Note Date: 03/04/2020   Patient Name: Isaac Stevens  DOB: 12-14-1935  MRN: [id number]  Age / Sex: 85 y.o., male  PCP: Adin Hector, MD Referring Physician: Aline August, MD  Reason for Consultation: Establishing goals of care  HPI/Patient Profile: 85 y.o. male  with past medical history of COPD, DM, stage III CKD, rheumatoid arthritis, HTN/HLD admitted on 03/01/2020 with acute hypoxemic respiratory failure due to Covid.   Clinical Assessment and Goals of Care: Mr. Jowett is lying quietly in bed.  He greets me making and mostly keeping eye contact.  He is alert and oriented, able to make his needs known.  There is no family at bedside at this time due to visitor restrictions.    Mr. Persky tells me that he wishes to leave the hospital.  He states that he has a doctor at home who can help take care of him.  He is unable to tell me this physician's name.  He shares that his son will help him.  We talked about his oxygen use, that he is requiring high amounts of oxygen.  Although he tells me that he can get oxygen, I am not sure he understands that the amount of oxygen he needs at this time cannot be delivered in a home unit.  I ask what he will do if he goes home and he gets worse.  Is he willing to go home even if it means he were to die?  He tells me that he is not going to die.  He shares that he feels that he can get up and move more in his own home.  He states that he is a prisoner here, pointing to the bed.   Mr. Lewinski tells me that he lives with his wife.  He states that she had Covid, but her health is getting better.  Conference with attending related to patient condition, needs, desire to leave.  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  His son, Hieu Herms is assisting.    SUMMARY OF RECOMMENDATIONS   At this point continue to treat the treatable Full scope/full  code Patient continues to state his desire to leave    Code Status/Advance Care Planning:  Full code -verified with patient  Symptom Management:   Per hospitalist, no additional needs at this time.  Palliative Prophylaxis:   Oral Care and Turn Reposition  Additional Recommendations (Limitations, Scope, Preferences):  Full Scope Treatment  Psycho-social/Spiritual:   Desire for further Chaplaincy support:no  Additional Recommendations: Caregiving  Support/Resources  Prognosis:   Unable to determine, based on outcomes.  Discharge Planning: Patient and family are requesting discharge to home.      Primary Diagnoses: Present on Admission: . Pneumonia due to COVID-19 virus . COPD (chronic obstructive pulmonary disease) ([place]) . Type 2 diabetes mellitus with stage 3 chronic kidney disease ([place]) . Acute respiratory failure due to COVID-19 ([place]) . Rheumatoid arthritis ([place]) . Essential hypertension   I have reviewed the  medical record, interviewed the patient and family, and examined the patient. The following aspects are pertinent.  Past Medical History:  Diagnosis Date  . CAFL (chronic airflow limitation) (HCC)   . CKD (chronic kidney disease)   . Diabetes mellitus without complication ([place])   . Hyperlipidemia   . Hypertension   . Rheumatoid arthritis ([place])    Social History   Socioeconomic History  . Marital status: Married    Spouse name: Not on file  . Number of children: Not on file  . Years of education: Not on file  . Highest education level: Not on file  Occupational History  . Not on file  Tobacco Use  . Smoking status: Former Research scientist (life sciences)  . Smokeless tobacco: Current User    Types: Chew  . Tobacco comment: quit 20 years ago , chews tobacco  Substance and Sexual Activity  . Alcohol use: No    Alcohol/week: 0.0 standard drinks  . Drug use: No  . Sexual activity: Not on file  Other Topics Concern  . Not on file  Social History Narrative   Lives  at home with wife, active at baseline   Social Determinants of Health   Financial Resource Strain: Not on file  Food Insecurity: Not on file  Transportation Needs: Not on file  Physical Activity: Not on file  Stress: Not on file  Social Connections: Not on file   Family History  Problem Relation Age of Onset  . Prostate cancer Father   . Parkinson's disease Mother    Scheduled Meds: . allopurinol  100 mg Oral Daily  . aspirin EC  81 mg Oral Daily  . atorvastatin  40 mg Oral q1800  . baricitinib  2 mg Oral Daily  . enoxaparin (LOVENOX) injection  0.5 mg/kg Subcutaneous Q24H  . folic acid  1 mg Oral Daily  . [START ON 03/05/2020] furosemide  40 mg Intravenous Daily  . hydroxychloroquine  400 mg Oral Daily  . insulin aspart  0-15 Units Subcutaneous TID WC  . insulin detemir  0.075 Units/kg Subcutaneous BID  . ipratropium  2 puff Inhalation Q6H  . loratadine  10 mg Oral Daily  . methylPREDNISolone (SOLU-MEDROL) injection  60 mg Intravenous Q12H  . sodium chloride flush  3 mL Intravenous Q12H  . sulfamethoxazole-trimethoprim  1 tablet Oral Q12H  . tamsulosin  0.4 mg Oral QPC supper  . vitamin B-12  1,000 mcg Oral Daily   Continuous Infusions: . sodium chloride    . albumin human 12.5 g (03/04/20 0926)  . remdesivir 100 mg in NS 100 mL 100 mg (03/04/20 1045)   PRN Meds:.sodium chloride, acetaminophen, guaiFENesin-dextromethorphan, ondansetron **OR** ondansetron (ZOFRAN) IV, sodium chloride flush Medications Prior to Admission:  Prior to Admission medications   Medication Sig Start Date End Date Taking? Authorizing Provider  allopurinol (ZYLOPRIM) 100 MG tablet Take 100 mg by mouth daily.   Yes [provider]  aspirin 81 MG chewable tablet Chew 81 mg by mouth daily. 12/11/19  Yes [provider]  atorvastatin (LIPITOR) 80 MG tablet Take 80 mg by mouth daily. 01/25/20  Yes [provider]  folic acid (FOLVITE) 1 MG tablet Take 1 mg by mouth daily.    Yes [provider]  hydrochlorothiazide (HYDRODIURIL) 12.5 MG tablet Take 12.5 mg by mouth daily. 12/23/19  Yes [provider]  hydroxychloroquine (PLAQUENIL) 200 MG tablet Take 400 mg by mouth daily.   Yes [provider]  metFORMIN (GLUCOPHAGE) 1000 MG tablet Take 1,000  mg by mouth daily.   Yes [provider]  tamsulosin (FLOMAX) 0.4 MG CAPS capsule Take 0.4 mg by mouth daily. 12/23/19  Yes [provider]   Allergies  Allergen Reactions  . Methotrexate Derivatives Shortness Of Breath  . Lisinopril Swelling  . Pseudoephedrine Hcl Other (See Comments)    confusion  . Sitagliptin Rash   Review of Systems  Unable to perform ROS: Age    Physical Exam Vitals and nursing note reviewed.  Constitutional:      General: He is not in acute distress.    Appearance: Normal appearance. He is not ill-appearing.  HENT:     Head: Normocephalic and atraumatic.     Mouth/Throat:     Mouth: Mucous membranes are moist.  Cardiovascular:     Rate and Rhythm: Normal rate.  Pulmonary:     Effort: Pulmonary effort is normal. No respiratory distress.     Comments: Able to make full sentences, no respiratory distress.  Nonrebreather and high flow with saturations in the mid 80s Abdominal:     General: Abdomen is flat. There is no distension.  Skin:    General: Skin is warm and dry.  Neurological:     Mental Status: He is alert and oriented to person, place, and time.  Psychiatric:        Mood and Affect: Mood normal.        Behavior: Behavior normal.     Vital Signs: BP 132/78 (BP Location: Left Arm)   Pulse 70   Temp 97.7 F (36.5 C)   Resp 17   Ht 5\' 8"  (1.727 m)   Wt 99 kg   SpO2 (!) 84%   BMI 33.19 kg/m  Pain Scale: 0-10 POSS *See Group Information*: 1-Acceptable,Awake and alert Pain Score: 0-No pain   SpO2: SpO2: (!) 84 % O2 Device:SpO2: (!) 84 % O2 Flow Rate: .O2 Flow Rate (L/min): 10 L/min  IO: Intake/output summary:    Intake/Output Summary (Last 24 hours) at 03/04/2020 1627 Last data filed at 03/04/2020 1500 Gross per 24 hour  Intake 300 ml  Output 900 ml  Net -600 ml    LBM: Last BM Date: 03/03/20 Baseline Weight: Weight: 99 kg Most recent weight: Weight: 99 kg     Palliative Assessment/Data:   Flowsheet Rows   Flowsheet Row Most Recent Value  Intake Tab   Referral Department Hospitalist  Unit at Time of Referral Med/Surg Unit  Palliative Care Primary Diagnosis Pulmonary  Date Notified 03/03/20  Palliative Care Type New Palliative care  Reason for referral Clarify Goals of Care  Date of Admission 03/01/20  Date first seen by Palliative Care 03/04/20  # of days Palliative referral response time 1 Day(s)  # of days IP prior to Palliative referral 2  Clinical Assessment   Palliative Performance Scale Score 50%  Pain Max last 24 hours Not able to report  Pain Min Last 24 hours Not able to report  Dyspnea Max Last 24 Hours Not able to report  Dyspnea Min Last 24 hours Not able to report  Psychosocial & Spiritual Assessment   Palliative Care Outcomes       Time In: 1550 Time Out: 1640 Time Total: 50 minutes  Greater than 50%  of this time was spent counseling and coordinating care related to the above assessment and plan.  Signed by: Drue Novel, NP   Please contact Palliative Medicine Team phone at [phone] for questions and concerns.  For individual provider: See  Amion

## 2020-03-05 DIAGNOSIS — U071 COVID-19: Secondary | ICD-10-CM | POA: Diagnosis not present

## 2020-03-05 LAB — FUNGITELL, SERUM: Fungitell Result: <31 pg/mL (ref <80)

## 2020-03-06 LAB — CULTURE, BLOOD (ROUTINE X 2)
Culture: NO GROWTH
Culture: NO GROWTH
Special Requests: ADEQUATE
Special Requests: ADEQUATE

## 2020-03-08 DIAGNOSIS — J9690 Respiratory failure, unspecified, unspecified whether with hypoxia or hypercapnia: Secondary | ICD-10-CM | POA: Diagnosis not present

## 2020-03-08 NOTE — Discharge Summary (Signed)
[facility] Discharge Summary   Patient: Isaac Stevens [id number]   PCP: Adin Hector, MD DOB: 1936-01-04   Date of admission: 03/01/2020   Date of discharge: 03/04/2020   Discharge Disposition: Patient signed out AMA despite being explained the risks of doing so including worsening medical condition as well as death.   Discharge Diagnoses: Acute hypoxic respiratory failure COVID-19 pneumonia Diabetes mellitus type 2 Chronic renal disease stage IIIa Hypertension Rheumatoid arthritis Obesity  Discharge Condition: Poor  Hospital Course:  85 year old male with history of COPD, diabetes mellitus type 2, chronic kidney disease stage III, rheumatoid arthritis, hypertension and dyslipidemia presented with worsening shortness of breath and was admitted for acute hypoxic respite failure due to Covid pneumonia.  He was hypoxic in the field with room air pulse oximetry in the 80s and required 5 L oxygen via nasal cannula with pulse ox of 94%.  During the hospitalization, respiratory status worsened requiring 15 L oxygen with nonrebreather mask.  Pulmonary was consulted.  Patient was maintained on steroids, remdesivir and baricitinib.  Pulmonary added Bactrim as well.  Patient still required a lot of supplemental oxygen including high flow nasal cannula/nonrebreather mask.  He signed out [name] on 03/04/2020 despite being explained the risks of doing so including death. Please refer to the daily progress notes for full details of hospital course.   Procedures and Results:  None  Consultations:  Pulmonary  The results of significant diagnostics from this hospitalization (including imaging, microbiology, ancillary and laboratory) are listed below for reference.    Significant Diagnostic Studies: CT Angio Chest PE W and/or Wo Contrast  Result Date: 03/01/2020 CLINICAL DATA:  Shortness of breath. Positive D-dimer study. COVID-19 positive EXAM: CT ANGIOGRAPHY CHEST  WITH CONTRAST TECHNIQUE: Multidetector CT imaging of the chest was performed using the standard protocol during bolus administration of intravenous contrast. Multiplanar CT image reconstructions and MIPs were obtained to evaluate the vascular anatomy. CONTRAST:  69mL OMNIPAQUE IOHEXOL 350 MG/ML SOLN COMPARISON:  Chest radiograph March 01, 2020; chest CT July 14, 2015 FINDINGS: Cardiovascular: There is no demonstrable pulmonary embolus. Ascending thoracic aortic diameter measures 4.1 x 4.1 cm. There is no appreciable thoracic aortic dissection. There are scattered foci of calcification in visualized great vessels. Note that the right innominate and left common carotid arteries arise as a common trunk, an anatomic variant. There is aortic atherosclerosis. There are scattered foci of coronary artery calcification. There is no pericardial thickening. Minimal pericardial fluid is within physiologic range. Mediastinum/Nodes: Visualized thyroid appears unremarkable. There is no evident thoracic adenopathy. No esophageal lesions are appreciable. Lungs/Pleura: There is extensive multifocal airspace opacity bilaterally, overall more severe on the right than on the left, without appreciable airspace consolidation. No pleural effusions are evident. No appreciable pneumothorax. Trachea and major bronchial structures appear patent. Upper Abdomen: There is incomplete visualization of an apparent cyst arising from the upper pole of the right kidney posteriorly measuring 1.5 x 1.0 cm. The liver as a somewhat nodular contour suggesting underlying cirrhosis. There is upper abdominal aortic and mesenteric arterial vascular calcification. Musculoskeletal: There is degenerative change in the lower thoracic spine. There are no blastic or lytic bone lesions. No evident chest wall lesions. Review of the MIP images confirms the above findings. IMPRESSION: 1. No demonstrable pulmonary embolus. Ascending thoracic aortic diameter measures 4.1  x 4.1 cm. Recommend annual imaging followup by CTA or MRA. This recommendation follows 2010 ACCF/AHA/AATS/ACR/ASA/SCA/SCAI/SIR/STS/SVM Guidelines for the Diagnosis and Management of Patients with Thoracic Aortic Disease.  Circulation. 2010; [id number]. Aortic aneurysm NOS (ICD10-I71.9). No dissection. Foci of great vessel and coronary artery calcification noted. There is aortic atherosclerosis. 2. Multifocal airspace opacity, likely due to multifocal atypical organism pneumonia. No consolidation. 3.  No evident adenopathy. 4.  Contour of the liver suggests underlying hepatic cirrhosis. Aortic Atherosclerosis (ICD10-I70.0). Electronically Signed   By: Lowella Grip III M.D.   On: 03/01/2020 13:38   DG Chest Port 1 View  Result Date: 03/01/2020 CLINICAL DATA:  COVID positive. Increasing shortness of breath. Hypoxia. EXAM: PORTABLE CHEST 1 VIEW COMPARISON:  One-view chest x-ray 03/11/2015. FINDINGS: Heart size is normal. Bibasilar airspace opacities are present. Small effusions are present. IMPRESSION: 1. Bibasilar airspace disease is consistent with pneumonia. 2. Small bilateral pleural effusions. Electronically Signed   By: San Morelle M.D.   On: 03/01/2020 11:16   ECHOCARDIOGRAM COMPLETE  Result Date: 03/01/2020    ECHOCARDIOGRAM REPORT   Patient Name:   Isaac Stevens Date of Exam: 03/01/2020 Medical Rec #:  [id number]        Height:       68.0 in Accession #:    [id number]       Weight:       218.3 lb Date of Birth:  Jan 31, 1936        BSA:          2.121 m Patient Age:    73 years         BP:           104/61 mmHg Patient Gender: M                HR:           86 bpm. Exam Location:  [facility] Procedure: 2D Echo, Cardiac Doppler, Color Doppler and Intracardiac            Opacification Agent Indications:     CHF- acute diastolic [id number]  History:         Patient has prior history of Echocardiogram examinations, most                  recent 11/20/2018. Risk Factors:Hypertension and Diabetes.   Sonographer:     [name] RDCS (AE) Referring Phys:  [place] Diagnosing Phys: Neoma Laming MD  Sonographer Comments: Technically challenging study due to limited acoustic windows and Technically difficult study due to poor echo windows. IMPRESSIONS  1. Left ventricular ejection fraction, by estimation, is 50 to 55%. The left ventricle has low normal function. The left ventricle has no regional wall motion abnormalities. There is mild concentric left ventricular hypertrophy. Left ventricular diastolic parameters are consistent with Grade I diastolic dysfunction (impaired relaxation).  2. Right ventricular systolic function is normal. The right ventricular size is normal.  3. The mitral valve is normal in structure. Mild mitral valve regurgitation. No evidence of mitral stenosis.  4. The aortic valve is normal in structure. Aortic valve regurgitation is not visualized. No aortic stenosis is present.  5. The inferior vena cava is normal in size with greater than 50% respiratory variability, suggesting right atrial pressure of 3 mmHg. FINDINGS  Left Ventricle: Left ventricular ejection fraction, by estimation, is 50 to 55%. The left ventricle has low normal function. The left ventricle has no regional wall motion abnormalities. Definity contrast agent was given IV to delineate the left ventricular endocardial borders. The left ventricular internal cavity size was normal in size. There is mild concentric left ventricular hypertrophy. Left ventricular diastolic parameters  are consistent with Grade I diastolic dysfunction (impaired relaxation). Right Ventricle: The right ventricular size is normal. No increase in right ventricular wall thickness. Right ventricular systolic function is normal. Left Atrium: Left atrial size was normal in size. Right Atrium: Right atrial size was normal in size. Pericardium: There is no evidence of pericardial effusion. Mitral Valve: The mitral valve is normal in structure.  Mild mitral valve regurgitation. No evidence of mitral valve stenosis. Tricuspid Valve: The tricuspid valve is normal in structure. Tricuspid valve regurgitation is trivial. No evidence of tricuspid stenosis. Aortic Valve: The aortic valve is normal in structure. Aortic valve regurgitation is not visualized. No aortic stenosis is present. Aortic valve mean gradient measures 2.5 mmHg. Aortic valve peak gradient measures 4.8 mmHg. Aortic valve area, by VTI measures 2.71 cm. Pulmonic Valve: The pulmonic valve was normal in structure. Pulmonic valve regurgitation is trivial. No evidence of pulmonic stenosis. Aorta: The aortic root is normal in size and structure. Venous: The inferior vena cava is normal in size with greater than 50% respiratory variability, suggesting right atrial pressure of 3 mmHg. IAS/Shunts: No atrial level shunt detected by color flow Doppler.  LEFT VENTRICLE PLAX 2D LVIDd:         4.30 cm  Diastology LVIDs:         3.10 cm  LV e' medial:    5.44 cm/s LV PW:         1.10 cm  LV E/e' medial:  12.9 LV IVS:        0.70 cm  LV e' lateral:   8.81 cm/s LVOT diam:     2.00 cm  LV E/e' lateral: 8.0 LV SV:         57 LV SV Index:   27 LVOT Area:     3.14 cm  RIGHT VENTRICLE RV Basal diam:  3.10 cm RV S prime:     16.40 cm/s TAPSE (M-mode): 4.8 cm LEFT ATRIUM             Index       RIGHT ATRIUM           Index LA diam:        4.10 cm 1.93 cm/m  RA Area:     18.90 cm LA Vol (A2C):   45.8 ml 21.59 ml/m RA Volume:   52.00 ml  24.52 ml/m LA Vol (A4C):   48.4 ml 22.82 ml/m LA Biplane Vol: 48.0 ml 22.63 ml/m  AORTIC VALVE AV Area (Vmax):    2.52 cm AV Area (Vmean):   2.61 cm AV Area (VTI):     2.71 cm AV Vmax:           110.00 cm/s AV Vmean:          72.450 cm/s AV VTI:            0.210 m AV Peak Grad:      4.8 mmHg AV Mean Grad:      2.5 mmHg LVOT Vmax:         88.10 cm/s LVOT Vmean:        60.300 cm/s LVOT VTI:          0.181 m LVOT/AV VTI ratio: 0.86 MITRAL VALVE               TRICUSPID VALVE MV  Area (PHT): 2.92 cm    TR Peak grad:   12.8 mmHg MV Decel Time: 260 msec    TR Vmax:  179.00 cm/s MV E velocity: 70.30 cm/s MV A velocity: 83.10 cm/s  SHUNTS MV E/A ratio:  0.85        Systemic VTI:  0.18 m                            Systemic Diam: 2.00 cm Neoma Laming MD Electronically signed by Neoma Laming MD Signature Date/Time: 03/01/2020/2:41:16 PM    Final     Microbiology: Recent Results (from the past 240 hour(s))  Blood Culture (routine x 2)     Status: None   Collection Time: 03/01/20 11:08 AM   Specimen: BLOOD  Result Value Ref Range Status   Specimen Description BLOOD RIGHT [facility]  Final   Special Requests   Final    BOTTLES DRAWN AEROBIC AND ANAEROBIC Blood Culture adequate volume   Culture   Final    NO GROWTH 5 DAYS Performed at [facility], [address] [place], [place] [address]    Report Status 03/06/2020 FINAL  Final  Blood Culture (routine x 2)     Status: None   Collection Time: 03/01/20 11:08 AM   Specimen: BLOOD  Result Value Ref Range Status   Specimen Description BLOOD RIGHT [facility]  Final   Special Requests   Final    BOTTLES DRAWN AEROBIC AND ANAEROBIC Blood Culture adequate volume   Culture   Final    NO GROWTH 5 DAYS Performed at [facility], [address] [place], [place] [address]    Report Status 03/06/2020 FINAL  Final  MRSA PCR Screening     Status: None   Collection Time: 03/03/20  2:42 AM   Specimen: Nasopharyngeal  Result Value Ref Range Status   MRSA by PCR NEGATIVE NEGATIVE Final    Comment:        The GeneXpert MRSA Assay (FDA approved for NASAL specimens only), is one component of a comprehensive MRSA colonization surveillance program. It is not intended to diagnose MRSA infection nor to guide or monitor treatment for MRSA infections. Performed at [facility], [address] [place], [place] [address]      Labs: CBC: Recent Labs  Lab 03/02/20 [phone] 03/03/20 0756 03/04/20 0659  WBC 4.0  8.9 7.6  NEUTROABS 3.4 7.7 7.0  HGB 13.0 13.2 13.2  HCT 37.6* 38.0* 37.7*  MCV 94.2 92.2 91.7  PLT 150 195 737   Basic Metabolic Panel: Recent Labs  Lab 03/02/20 0729 03/03/20 0756 03/04/20 0659  NA 133* 135 133*  K 4.4 4.4 4.7  CL 97* 99 99  CO2 23 24 23   GLUCOSE 143* 159* 173*  BUN 37* 55* 59*  CREATININE 1.45* 1.74* 1.65*  CALCIUM 8.6* 8.9 8.7*  MG 2.1 2.2 2.3  PHOS 5.8* 5.5* 4.7*   Liver Function Tests: Recent Labs  Lab 03/02/20 0729 03/03/20 0756 03/04/20 0659  AST 24 24 31   ALT 17 19 23   ALKPHOS 50 50 48  BILITOT 0.5 0.6 0.6  PROT 6.8 6.9 6.8  ALBUMIN 3.2* 3.3* 3.4*   No results for input(s): LIPASE, AMYLASE in the last 168 hours. No results for input(s): AMMONIA in the last 168 hours. Cardiac Enzymes: No results for input(s): CKTOTAL, CKMB, CKMBINDEX, TROPONINI in the last 168 hours. BNP (last 3 results) Recent Labs    03/01/20 1019  BNP 82.7   CBG: Recent Labs  Lab 03/03/20 2214 03/03/20 2243 03/04/20 0823 03/04/20 1154 03/04/20 1657  GLUCAP 150* 153*  171* 186* 123*     Signed:  Bernyce Brimley  [facility] 03/08/2020, 11:43 AM

## 2020-03-09 LAB — ASPERGILLUS ANTIGEN, BAL/SERUM: Aspergillus Ag, BAL/Serum: 0.1 Index (ref 0.00–0.49)

## 2020-03-10 DIAGNOSIS — E785 Hyperlipidemia, unspecified: Secondary | ICD-10-CM | POA: Diagnosis not present

## 2020-03-10 DIAGNOSIS — N183 Chronic kidney disease, stage 3 unspecified: Secondary | ICD-10-CM | POA: Diagnosis not present

## 2020-03-10 DIAGNOSIS — Z87891 Personal history of nicotine dependence: Secondary | ICD-10-CM | POA: Diagnosis not present

## 2020-03-10 DIAGNOSIS — J1282 Pneumonia due to coronavirus disease 2019: Secondary | ICD-10-CM | POA: Diagnosis not present

## 2020-03-10 DIAGNOSIS — I129 Hypertensive chronic kidney disease with stage 1 through stage 4 chronic kidney disease, or unspecified chronic kidney disease: Secondary | ICD-10-CM | POA: Diagnosis not present

## 2020-03-10 DIAGNOSIS — Z9981 Dependence on supplemental oxygen: Secondary | ICD-10-CM | POA: Diagnosis not present

## 2020-03-10 DIAGNOSIS — Z9911 Dependence on respirator [ventilator] status: Secondary | ICD-10-CM | POA: Diagnosis not present

## 2020-03-10 DIAGNOSIS — Z452 Encounter for adjustment and management of vascular access device: Secondary | ICD-10-CM | POA: Diagnosis not present

## 2020-03-10 DIAGNOSIS — I69351 Hemiplegia and hemiparesis following cerebral infarction affecting right dominant side: Secondary | ICD-10-CM | POA: Diagnosis not present

## 2020-03-10 DIAGNOSIS — Z978 Presence of other specified devices: Secondary | ICD-10-CM | POA: Diagnosis not present

## 2020-03-10 DIAGNOSIS — D508 Other iron deficiency anemias: Secondary | ICD-10-CM | POA: Diagnosis not present

## 2020-03-10 DIAGNOSIS — J440 Chronic obstructive pulmonary disease with acute lower respiratory infection: Secondary | ICD-10-CM | POA: Diagnosis not present

## 2020-03-10 DIAGNOSIS — R069 Unspecified abnormalities of breathing: Secondary | ICD-10-CM | POA: Diagnosis not present

## 2020-03-10 DIAGNOSIS — R0902 Hypoxemia: Secondary | ICD-10-CM | POA: Diagnosis not present

## 2020-03-10 DIAGNOSIS — J189 Pneumonia, unspecified organism: Secondary | ICD-10-CM | POA: Diagnosis not present

## 2020-03-10 DIAGNOSIS — R4182 Altered mental status, unspecified: Secondary | ICD-10-CM | POA: Diagnosis not present

## 2020-03-10 DIAGNOSIS — R0689 Other abnormalities of breathing: Secondary | ICD-10-CM | POA: Diagnosis not present

## 2020-03-10 DIAGNOSIS — E1122 Type 2 diabetes mellitus with diabetic chronic kidney disease: Secondary | ICD-10-CM | POA: Diagnosis not present

## 2020-03-10 DIAGNOSIS — A4189 Other specified sepsis: Secondary | ICD-10-CM | POA: Diagnosis not present

## 2020-03-10 DIAGNOSIS — R54 Age-related physical debility: Secondary | ICD-10-CM | POA: Diagnosis not present

## 2020-03-10 DIAGNOSIS — I130 Hypertensive heart and chronic kidney disease with heart failure and stage 1 through stage 4 chronic kidney disease, or unspecified chronic kidney disease: Secondary | ICD-10-CM | POA: Diagnosis not present

## 2020-03-10 DIAGNOSIS — F419 Anxiety disorder, unspecified: Secondary | ICD-10-CM | POA: Diagnosis not present

## 2020-03-10 DIAGNOSIS — N189 Chronic kidney disease, unspecified: Secondary | ICD-10-CM | POA: Diagnosis not present

## 2020-03-10 DIAGNOSIS — E1165 Type 2 diabetes mellitus with hyperglycemia: Secondary | ICD-10-CM | POA: Diagnosis not present

## 2020-03-10 DIAGNOSIS — I491 Atrial premature depolarization: Secondary | ICD-10-CM | POA: Diagnosis not present

## 2020-03-10 DIAGNOSIS — K560 Paralytic ileus: Secondary | ICD-10-CM | POA: Diagnosis not present

## 2020-03-10 DIAGNOSIS — Z4682 Encounter for fitting and adjustment of non-vascular catheter: Secondary | ICD-10-CM | POA: Diagnosis not present

## 2020-03-10 DIAGNOSIS — J9811 Atelectasis: Secondary | ICD-10-CM | POA: Diagnosis not present

## 2020-03-10 DIAGNOSIS — I6381 Other cerebral infarction due to occlusion or stenosis of small artery: Secondary | ICD-10-CM | POA: Diagnosis not present

## 2020-03-10 DIAGNOSIS — R531 Weakness: Secondary | ICD-10-CM | POA: Diagnosis not present

## 2020-03-10 DIAGNOSIS — D6869 Other thrombophilia: Secondary | ICD-10-CM | POA: Diagnosis not present

## 2020-03-10 DIAGNOSIS — N179 Acute kidney failure, unspecified: Secondary | ICD-10-CM | POA: Diagnosis not present

## 2020-03-10 DIAGNOSIS — R0602 Shortness of breath: Secondary | ICD-10-CM | POA: Diagnosis not present

## 2020-03-10 DIAGNOSIS — J449 Chronic obstructive pulmonary disease, unspecified: Secondary | ICD-10-CM | POA: Diagnosis not present

## 2020-03-10 DIAGNOSIS — D649 Anemia, unspecified: Secondary | ICD-10-CM | POA: Diagnosis not present

## 2020-03-10 DIAGNOSIS — E119 Type 2 diabetes mellitus without complications: Secondary | ICD-10-CM | POA: Diagnosis not present

## 2020-03-10 DIAGNOSIS — G9349 Other encephalopathy: Secondary | ICD-10-CM | POA: Diagnosis not present

## 2020-03-10 DIAGNOSIS — J9601 Acute respiratory failure with hypoxia: Secondary | ICD-10-CM | POA: Diagnosis not present

## 2020-03-10 DIAGNOSIS — Z794 Long term (current) use of insulin: Secondary | ICD-10-CM | POA: Diagnosis not present

## 2020-03-10 DIAGNOSIS — U071 COVID-19: Secondary | ICD-10-CM | POA: Diagnosis not present

## 2020-03-10 DIAGNOSIS — J80 Acute respiratory distress syndrome: Secondary | ICD-10-CM | POA: Diagnosis not present

## 2020-03-10 DIAGNOSIS — J95851 Ventilator associated pneumonia: Secondary | ICD-10-CM | POA: Diagnosis not present

## 2020-03-10 DIAGNOSIS — R0603 Acute respiratory distress: Secondary | ICD-10-CM | POA: Diagnosis not present

## 2020-03-10 DIAGNOSIS — Z8616 Personal history of COVID-19: Secondary | ICD-10-CM | POA: Diagnosis not present

## 2020-03-10 DIAGNOSIS — R918 Other nonspecific abnormal finding of lung field: Secondary | ICD-10-CM | POA: Diagnosis not present

## 2020-03-10 DIAGNOSIS — R140 Abdominal distension (gaseous): Secondary | ICD-10-CM | POA: Diagnosis not present

## 2020-03-10 DIAGNOSIS — R6521 Severe sepsis with septic shock: Secondary | ICD-10-CM | POA: Diagnosis not present

## 2020-03-10 DIAGNOSIS — J90 Pleural effusion, not elsewhere classified: Secondary | ICD-10-CM | POA: Diagnosis not present

## 2020-03-10 DIAGNOSIS — R5081 Fever presenting with conditions classified elsewhere: Secondary | ICD-10-CM | POA: Diagnosis not present

## 2020-03-10 DIAGNOSIS — I10 Essential (primary) hypertension: Secondary | ICD-10-CM | POA: Diagnosis not present

## 2020-03-10 DIAGNOSIS — E875 Hyperkalemia: Secondary | ICD-10-CM | POA: Diagnosis not present

## 2020-03-10 DIAGNOSIS — I952 Hypotension due to drugs: Secondary | ICD-10-CM | POA: Diagnosis not present

## 2020-03-10 DIAGNOSIS — M069 Rheumatoid arthritis, unspecified: Secondary | ICD-10-CM | POA: Diagnosis not present

## 2020-03-10 DIAGNOSIS — Z515 Encounter for palliative care: Secondary | ICD-10-CM | POA: Diagnosis not present

## 2020-03-11 DIAGNOSIS — Z9911 Dependence on respirator [ventilator] status: Secondary | ICD-10-CM | POA: Diagnosis not present

## 2020-03-11 DIAGNOSIS — J9601 Acute respiratory failure with hypoxia: Secondary | ICD-10-CM | POA: Diagnosis not present

## 2020-03-11 DIAGNOSIS — J1282 Pneumonia due to coronavirus disease 2019: Secondary | ICD-10-CM | POA: Diagnosis not present

## 2020-03-11 DIAGNOSIS — U071 COVID-19: Secondary | ICD-10-CM | POA: Diagnosis not present

## 2020-03-11 DIAGNOSIS — Z9981 Dependence on supplemental oxygen: Secondary | ICD-10-CM | POA: Diagnosis not present

## 2020-03-12 DIAGNOSIS — J9601 Acute respiratory failure with hypoxia: Secondary | ICD-10-CM | POA: Diagnosis not present

## 2020-03-12 DIAGNOSIS — U071 COVID-19: Secondary | ICD-10-CM | POA: Diagnosis not present

## 2020-03-12 DIAGNOSIS — Z9981 Dependence on supplemental oxygen: Secondary | ICD-10-CM | POA: Diagnosis not present

## 2020-03-12 DIAGNOSIS — J1282 Pneumonia due to coronavirus disease 2019: Secondary | ICD-10-CM | POA: Diagnosis not present

## 2020-03-13 DIAGNOSIS — J9601 Acute respiratory failure with hypoxia: Secondary | ICD-10-CM | POA: Diagnosis not present

## 2020-03-13 DIAGNOSIS — R0603 Acute respiratory distress: Secondary | ICD-10-CM | POA: Diagnosis not present

## 2020-03-13 DIAGNOSIS — Z9911 Dependence on respirator [ventilator] status: Secondary | ICD-10-CM | POA: Diagnosis not present

## 2020-03-13 DIAGNOSIS — J189 Pneumonia, unspecified organism: Secondary | ICD-10-CM | POA: Diagnosis not present

## 2020-03-13 DIAGNOSIS — U071 COVID-19: Secondary | ICD-10-CM | POA: Diagnosis not present

## 2020-03-13 DIAGNOSIS — J1282 Pneumonia due to coronavirus disease 2019: Secondary | ICD-10-CM | POA: Diagnosis not present

## 2020-03-14 DIAGNOSIS — J1282 Pneumonia due to coronavirus disease 2019: Secondary | ICD-10-CM | POA: Diagnosis not present

## 2020-03-14 DIAGNOSIS — U071 COVID-19: Secondary | ICD-10-CM | POA: Diagnosis not present

## 2020-03-14 DIAGNOSIS — Z4682 Encounter for fitting and adjustment of non-vascular catheter: Secondary | ICD-10-CM | POA: Diagnosis not present

## 2020-03-14 DIAGNOSIS — R918 Other nonspecific abnormal finding of lung field: Secondary | ICD-10-CM | POA: Diagnosis not present

## 2020-03-14 DIAGNOSIS — N189 Chronic kidney disease, unspecified: Secondary | ICD-10-CM | POA: Diagnosis not present

## 2020-03-14 DIAGNOSIS — Z9911 Dependence on respirator [ventilator] status: Secondary | ICD-10-CM | POA: Diagnosis not present

## 2020-03-14 DIAGNOSIS — N179 Acute kidney failure, unspecified: Secondary | ICD-10-CM | POA: Diagnosis not present

## 2020-03-14 DIAGNOSIS — Z452 Encounter for adjustment and management of vascular access device: Secondary | ICD-10-CM | POA: Diagnosis not present

## 2020-03-14 DIAGNOSIS — J9601 Acute respiratory failure with hypoxia: Secondary | ICD-10-CM | POA: Diagnosis not present

## 2020-03-14 DIAGNOSIS — Z978 Presence of other specified devices: Secondary | ICD-10-CM | POA: Diagnosis not present

## 2020-03-15 DIAGNOSIS — N183 Chronic kidney disease, stage 3 unspecified: Secondary | ICD-10-CM | POA: Diagnosis not present

## 2020-03-15 DIAGNOSIS — J1282 Pneumonia due to coronavirus disease 2019: Secondary | ICD-10-CM | POA: Diagnosis not present

## 2020-03-15 DIAGNOSIS — U071 COVID-19: Secondary | ICD-10-CM | POA: Diagnosis not present

## 2020-03-15 DIAGNOSIS — J9601 Acute respiratory failure with hypoxia: Secondary | ICD-10-CM | POA: Diagnosis not present

## 2020-03-16 DIAGNOSIS — R140 Abdominal distension (gaseous): Secondary | ICD-10-CM | POA: Diagnosis not present

## 2020-03-16 DIAGNOSIS — K560 Paralytic ileus: Secondary | ICD-10-CM | POA: Diagnosis not present

## 2020-03-16 DIAGNOSIS — J9601 Acute respiratory failure with hypoxia: Secondary | ICD-10-CM | POA: Diagnosis not present

## 2020-03-16 DIAGNOSIS — J1282 Pneumonia due to coronavirus disease 2019: Secondary | ICD-10-CM | POA: Diagnosis not present

## 2020-03-16 DIAGNOSIS — N183 Chronic kidney disease, stage 3 unspecified: Secondary | ICD-10-CM | POA: Diagnosis not present

## 2020-03-16 DIAGNOSIS — U071 COVID-19: Secondary | ICD-10-CM | POA: Diagnosis not present

## 2020-03-17 DIAGNOSIS — N189 Chronic kidney disease, unspecified: Secondary | ICD-10-CM | POA: Diagnosis not present

## 2020-03-17 DIAGNOSIS — J1282 Pneumonia due to coronavirus disease 2019: Secondary | ICD-10-CM | POA: Diagnosis not present

## 2020-03-17 DIAGNOSIS — U071 COVID-19: Secondary | ICD-10-CM | POA: Diagnosis not present

## 2020-03-17 DIAGNOSIS — N179 Acute kidney failure, unspecified: Secondary | ICD-10-CM | POA: Diagnosis not present

## 2020-03-17 DIAGNOSIS — J9601 Acute respiratory failure with hypoxia: Secondary | ICD-10-CM | POA: Diagnosis not present

## 2020-03-18 DIAGNOSIS — U071 COVID-19: Secondary | ICD-10-CM | POA: Diagnosis not present

## 2020-03-18 DIAGNOSIS — R918 Other nonspecific abnormal finding of lung field: Secondary | ICD-10-CM | POA: Diagnosis not present

## 2020-03-18 DIAGNOSIS — R0902 Hypoxemia: Secondary | ICD-10-CM | POA: Diagnosis not present

## 2020-03-18 DIAGNOSIS — J1282 Pneumonia due to coronavirus disease 2019: Secondary | ICD-10-CM | POA: Diagnosis not present

## 2020-03-18 DIAGNOSIS — Z9911 Dependence on respirator [ventilator] status: Secondary | ICD-10-CM | POA: Diagnosis not present

## 2020-03-18 DIAGNOSIS — J9601 Acute respiratory failure with hypoxia: Secondary | ICD-10-CM | POA: Diagnosis not present

## 2020-03-19 DIAGNOSIS — J1282 Pneumonia due to coronavirus disease 2019: Secondary | ICD-10-CM | POA: Diagnosis not present

## 2020-03-19 DIAGNOSIS — J9601 Acute respiratory failure with hypoxia: Secondary | ICD-10-CM | POA: Diagnosis not present

## 2020-03-19 DIAGNOSIS — R0603 Acute respiratory distress: Secondary | ICD-10-CM | POA: Diagnosis not present

## 2020-03-19 DIAGNOSIS — Z4682 Encounter for fitting and adjustment of non-vascular catheter: Secondary | ICD-10-CM | POA: Diagnosis not present

## 2020-03-19 DIAGNOSIS — Z452 Encounter for adjustment and management of vascular access device: Secondary | ICD-10-CM | POA: Diagnosis not present

## 2020-03-19 DIAGNOSIS — Z9911 Dependence on respirator [ventilator] status: Secondary | ICD-10-CM | POA: Diagnosis not present

## 2020-03-19 DIAGNOSIS — U071 COVID-19: Secondary | ICD-10-CM | POA: Diagnosis not present

## 2020-03-19 DIAGNOSIS — R918 Other nonspecific abnormal finding of lung field: Secondary | ICD-10-CM | POA: Diagnosis not present

## 2020-03-20 DIAGNOSIS — U071 COVID-19: Secondary | ICD-10-CM | POA: Diagnosis not present

## 2020-03-20 DIAGNOSIS — J1282 Pneumonia due to coronavirus disease 2019: Secondary | ICD-10-CM | POA: Diagnosis not present

## 2020-03-20 DIAGNOSIS — J9601 Acute respiratory failure with hypoxia: Secondary | ICD-10-CM | POA: Diagnosis not present

## 2020-03-20 DIAGNOSIS — Z9911 Dependence on respirator [ventilator] status: Secondary | ICD-10-CM | POA: Diagnosis not present

## 2020-03-20 DIAGNOSIS — I491 Atrial premature depolarization: Secondary | ICD-10-CM | POA: Diagnosis not present

## 2020-03-21 DIAGNOSIS — U071 COVID-19: Secondary | ICD-10-CM | POA: Diagnosis not present

## 2020-03-21 DIAGNOSIS — Z9911 Dependence on respirator [ventilator] status: Secondary | ICD-10-CM | POA: Diagnosis not present

## 2020-03-21 DIAGNOSIS — Z4682 Encounter for fitting and adjustment of non-vascular catheter: Secondary | ICD-10-CM | POA: Diagnosis not present

## 2020-03-21 DIAGNOSIS — J1282 Pneumonia due to coronavirus disease 2019: Secondary | ICD-10-CM | POA: Diagnosis not present

## 2020-03-21 DIAGNOSIS — J9601 Acute respiratory failure with hypoxia: Secondary | ICD-10-CM | POA: Diagnosis not present

## 2020-03-22 DIAGNOSIS — J9601 Acute respiratory failure with hypoxia: Secondary | ICD-10-CM | POA: Diagnosis not present

## 2020-03-22 DIAGNOSIS — Z9911 Dependence on respirator [ventilator] status: Secondary | ICD-10-CM | POA: Diagnosis not present

## 2020-03-22 DIAGNOSIS — N189 Chronic kidney disease, unspecified: Secondary | ICD-10-CM | POA: Diagnosis not present

## 2020-03-22 DIAGNOSIS — J1282 Pneumonia due to coronavirus disease 2019: Secondary | ICD-10-CM | POA: Diagnosis not present

## 2020-03-22 DIAGNOSIS — N179 Acute kidney failure, unspecified: Secondary | ICD-10-CM | POA: Diagnosis not present

## 2020-03-22 DIAGNOSIS — U071 COVID-19: Secondary | ICD-10-CM | POA: Diagnosis not present

## 2020-03-23 DIAGNOSIS — Z9911 Dependence on respirator [ventilator] status: Secondary | ICD-10-CM | POA: Diagnosis not present

## 2020-03-23 DIAGNOSIS — N189 Chronic kidney disease, unspecified: Secondary | ICD-10-CM | POA: Diagnosis not present

## 2020-03-23 DIAGNOSIS — Z515 Encounter for palliative care: Secondary | ICD-10-CM | POA: Diagnosis not present

## 2020-03-23 DIAGNOSIS — J9601 Acute respiratory failure with hypoxia: Secondary | ICD-10-CM | POA: Diagnosis not present

## 2020-03-23 DIAGNOSIS — J1282 Pneumonia due to coronavirus disease 2019: Secondary | ICD-10-CM | POA: Diagnosis not present

## 2020-03-23 DIAGNOSIS — U071 COVID-19: Secondary | ICD-10-CM | POA: Diagnosis not present

## 2020-03-23 DIAGNOSIS — I6381 Other cerebral infarction due to occlusion or stenosis of small artery: Secondary | ICD-10-CM | POA: Diagnosis not present

## 2020-03-23 DIAGNOSIS — N179 Acute kidney failure, unspecified: Secondary | ICD-10-CM | POA: Diagnosis not present

## 2020-03-24 DIAGNOSIS — R5081 Fever presenting with conditions classified elsewhere: Secondary | ICD-10-CM | POA: Diagnosis not present

## 2020-03-24 DIAGNOSIS — J1282 Pneumonia due to coronavirus disease 2019: Secondary | ICD-10-CM | POA: Diagnosis not present

## 2020-03-24 DIAGNOSIS — J9601 Acute respiratory failure with hypoxia: Secondary | ICD-10-CM | POA: Diagnosis not present

## 2020-03-24 DIAGNOSIS — N179 Acute kidney failure, unspecified: Secondary | ICD-10-CM | POA: Diagnosis not present

## 2020-03-24 DIAGNOSIS — U071 COVID-19: Secondary | ICD-10-CM | POA: Diagnosis not present

## 2020-03-24 DIAGNOSIS — N189 Chronic kidney disease, unspecified: Secondary | ICD-10-CM | POA: Diagnosis not present

## 2020-03-24 DIAGNOSIS — R4182 Altered mental status, unspecified: Secondary | ICD-10-CM | POA: Diagnosis not present

## 2020-03-24 DIAGNOSIS — R918 Other nonspecific abnormal finding of lung field: Secondary | ICD-10-CM | POA: Diagnosis not present

## 2020-03-24 DIAGNOSIS — Z9981 Dependence on supplemental oxygen: Secondary | ICD-10-CM | POA: Diagnosis not present

## 2020-03-25 DIAGNOSIS — N179 Acute kidney failure, unspecified: Secondary | ICD-10-CM | POA: Diagnosis not present

## 2020-03-25 DIAGNOSIS — Z9981 Dependence on supplemental oxygen: Secondary | ICD-10-CM | POA: Diagnosis not present

## 2020-03-25 DIAGNOSIS — Z515 Encounter for palliative care: Secondary | ICD-10-CM | POA: Diagnosis not present

## 2020-03-25 DIAGNOSIS — U071 COVID-19: Secondary | ICD-10-CM | POA: Diagnosis not present

## 2020-03-25 DIAGNOSIS — J1282 Pneumonia due to coronavirus disease 2019: Secondary | ICD-10-CM | POA: Diagnosis not present

## 2020-03-25 DIAGNOSIS — J9601 Acute respiratory failure with hypoxia: Secondary | ICD-10-CM | POA: Diagnosis not present

## 2020-03-25 DIAGNOSIS — N189 Chronic kidney disease, unspecified: Secondary | ICD-10-CM | POA: Diagnosis not present

## 2020-03-26 DIAGNOSIS — J1282 Pneumonia due to coronavirus disease 2019: Secondary | ICD-10-CM | POA: Diagnosis not present

## 2020-03-26 DIAGNOSIS — N189 Chronic kidney disease, unspecified: Secondary | ICD-10-CM | POA: Diagnosis not present

## 2020-03-26 DIAGNOSIS — J9601 Acute respiratory failure with hypoxia: Secondary | ICD-10-CM | POA: Diagnosis not present

## 2020-03-26 DIAGNOSIS — U071 COVID-19: Secondary | ICD-10-CM | POA: Diagnosis not present

## 2020-03-26 DIAGNOSIS — N179 Acute kidney failure, unspecified: Secondary | ICD-10-CM | POA: Diagnosis not present

## 2020-03-26 DIAGNOSIS — Z9981 Dependence on supplemental oxygen: Secondary | ICD-10-CM | POA: Diagnosis not present

## 2020-03-26 DIAGNOSIS — Z515 Encounter for palliative care: Secondary | ICD-10-CM | POA: Diagnosis not present

## 2020-03-27 DIAGNOSIS — J9601 Acute respiratory failure with hypoxia: Secondary | ICD-10-CM | POA: Diagnosis not present

## 2020-03-27 DIAGNOSIS — Z9981 Dependence on supplemental oxygen: Secondary | ICD-10-CM | POA: Diagnosis not present

## 2020-03-27 DIAGNOSIS — J1282 Pneumonia due to coronavirus disease 2019: Secondary | ICD-10-CM | POA: Diagnosis not present

## 2020-03-27 DIAGNOSIS — N179 Acute kidney failure, unspecified: Secondary | ICD-10-CM | POA: Diagnosis not present

## 2020-03-27 DIAGNOSIS — U071 COVID-19: Secondary | ICD-10-CM | POA: Diagnosis not present

## 2020-03-27 DIAGNOSIS — N189 Chronic kidney disease, unspecified: Secondary | ICD-10-CM | POA: Diagnosis not present

## 2020-03-28 DIAGNOSIS — N179 Acute kidney failure, unspecified: Secondary | ICD-10-CM | POA: Diagnosis not present

## 2020-03-28 DIAGNOSIS — Z9981 Dependence on supplemental oxygen: Secondary | ICD-10-CM | POA: Diagnosis not present

## 2020-03-28 DIAGNOSIS — N189 Chronic kidney disease, unspecified: Secondary | ICD-10-CM | POA: Diagnosis not present

## 2020-03-28 DIAGNOSIS — U071 COVID-19: Secondary | ICD-10-CM | POA: Diagnosis not present

## 2020-03-28 DIAGNOSIS — J1282 Pneumonia due to coronavirus disease 2019: Secondary | ICD-10-CM | POA: Diagnosis not present

## 2020-03-28 DIAGNOSIS — J9811 Atelectasis: Secondary | ICD-10-CM | POA: Diagnosis not present

## 2020-03-28 DIAGNOSIS — J9601 Acute respiratory failure with hypoxia: Secondary | ICD-10-CM | POA: Diagnosis not present

## 2020-03-28 DIAGNOSIS — J90 Pleural effusion, not elsewhere classified: Secondary | ICD-10-CM | POA: Diagnosis not present

## 2020-03-29 DIAGNOSIS — I10 Essential (primary) hypertension: Secondary | ICD-10-CM | POA: Diagnosis not present

## 2020-03-29 DIAGNOSIS — E119 Type 2 diabetes mellitus without complications: Secondary | ICD-10-CM | POA: Diagnosis not present

## 2020-03-29 DIAGNOSIS — R4182 Altered mental status, unspecified: Secondary | ICD-10-CM | POA: Diagnosis not present

## 2020-03-29 DIAGNOSIS — D649 Anemia, unspecified: Secondary | ICD-10-CM | POA: Diagnosis not present

## 2020-03-29 DIAGNOSIS — U071 COVID-19: Secondary | ICD-10-CM | POA: Diagnosis not present

## 2020-03-29 DIAGNOSIS — N189 Chronic kidney disease, unspecified: Secondary | ICD-10-CM | POA: Diagnosis not present

## 2020-03-29 DIAGNOSIS — J449 Chronic obstructive pulmonary disease, unspecified: Secondary | ICD-10-CM | POA: Diagnosis not present

## 2020-03-29 DIAGNOSIS — J9601 Acute respiratory failure with hypoxia: Secondary | ICD-10-CM | POA: Diagnosis not present

## 2020-03-29 DIAGNOSIS — N179 Acute kidney failure, unspecified: Secondary | ICD-10-CM | POA: Diagnosis not present

## 2020-03-30 DIAGNOSIS — I10 Essential (primary) hypertension: Secondary | ICD-10-CM | POA: Diagnosis not present

## 2020-03-30 DIAGNOSIS — N189 Chronic kidney disease, unspecified: Secondary | ICD-10-CM | POA: Diagnosis not present

## 2020-03-30 DIAGNOSIS — N179 Acute kidney failure, unspecified: Secondary | ICD-10-CM | POA: Diagnosis not present

## 2020-03-30 DIAGNOSIS — D649 Anemia, unspecified: Secondary | ICD-10-CM | POA: Diagnosis not present

## 2020-03-30 DIAGNOSIS — R4182 Altered mental status, unspecified: Secondary | ICD-10-CM | POA: Diagnosis not present

## 2020-03-30 DIAGNOSIS — U071 COVID-19: Secondary | ICD-10-CM | POA: Diagnosis not present

## 2020-03-30 DIAGNOSIS — E119 Type 2 diabetes mellitus without complications: Secondary | ICD-10-CM | POA: Diagnosis not present

## 2020-03-30 DIAGNOSIS — J449 Chronic obstructive pulmonary disease, unspecified: Secondary | ICD-10-CM | POA: Diagnosis not present

## 2020-03-30 DIAGNOSIS — J9601 Acute respiratory failure with hypoxia: Secondary | ICD-10-CM | POA: Diagnosis not present

## 2020-03-31 DIAGNOSIS — I10 Essential (primary) hypertension: Secondary | ICD-10-CM | POA: Diagnosis not present

## 2020-03-31 DIAGNOSIS — N179 Acute kidney failure, unspecified: Secondary | ICD-10-CM | POA: Diagnosis not present

## 2020-03-31 DIAGNOSIS — U071 COVID-19: Secondary | ICD-10-CM | POA: Diagnosis not present

## 2020-03-31 DIAGNOSIS — N189 Chronic kidney disease, unspecified: Secondary | ICD-10-CM | POA: Diagnosis not present

## 2020-03-31 DIAGNOSIS — D649 Anemia, unspecified: Secondary | ICD-10-CM | POA: Diagnosis not present

## 2020-03-31 DIAGNOSIS — J9601 Acute respiratory failure with hypoxia: Secondary | ICD-10-CM | POA: Diagnosis not present

## 2020-03-31 DIAGNOSIS — R4182 Altered mental status, unspecified: Secondary | ICD-10-CM | POA: Diagnosis not present

## 2020-03-31 DIAGNOSIS — I130 Hypertensive heart and chronic kidney disease with heart failure and stage 1 through stage 4 chronic kidney disease, or unspecified chronic kidney disease: Secondary | ICD-10-CM | POA: Diagnosis not present

## 2020-04-01 DIAGNOSIS — J9601 Acute respiratory failure with hypoxia: Secondary | ICD-10-CM | POA: Diagnosis not present

## 2020-04-01 DIAGNOSIS — R4182 Altered mental status, unspecified: Secondary | ICD-10-CM | POA: Diagnosis not present

## 2020-04-01 DIAGNOSIS — N179 Acute kidney failure, unspecified: Secondary | ICD-10-CM | POA: Diagnosis not present

## 2020-04-01 DIAGNOSIS — N189 Chronic kidney disease, unspecified: Secondary | ICD-10-CM | POA: Diagnosis not present

## 2020-04-01 DIAGNOSIS — E119 Type 2 diabetes mellitus without complications: Secondary | ICD-10-CM | POA: Diagnosis not present

## 2020-04-01 DIAGNOSIS — U071 COVID-19: Secondary | ICD-10-CM | POA: Diagnosis not present

## 2020-04-01 DIAGNOSIS — I10 Essential (primary) hypertension: Secondary | ICD-10-CM | POA: Diagnosis not present

## 2020-04-01 DIAGNOSIS — J449 Chronic obstructive pulmonary disease, unspecified: Secondary | ICD-10-CM | POA: Diagnosis not present

## 2020-04-02 DIAGNOSIS — N189 Chronic kidney disease, unspecified: Secondary | ICD-10-CM | POA: Diagnosis not present

## 2020-04-02 DIAGNOSIS — J9601 Acute respiratory failure with hypoxia: Secondary | ICD-10-CM | POA: Diagnosis not present

## 2020-04-02 DIAGNOSIS — I10 Essential (primary) hypertension: Secondary | ICD-10-CM | POA: Diagnosis not present

## 2020-04-02 DIAGNOSIS — U071 COVID-19: Secondary | ICD-10-CM | POA: Diagnosis not present

## 2020-04-02 DIAGNOSIS — N179 Acute kidney failure, unspecified: Secondary | ICD-10-CM | POA: Diagnosis not present

## 2020-04-02 DIAGNOSIS — E119 Type 2 diabetes mellitus without complications: Secondary | ICD-10-CM | POA: Diagnosis not present

## 2020-04-03 DIAGNOSIS — U071 COVID-19: Secondary | ICD-10-CM | POA: Diagnosis not present

## 2020-04-03 DIAGNOSIS — Z515 Encounter for palliative care: Secondary | ICD-10-CM | POA: Diagnosis not present

## 2020-04-03 DIAGNOSIS — J9601 Acute respiratory failure with hypoxia: Secondary | ICD-10-CM | POA: Diagnosis not present

## 2020-04-04 DIAGNOSIS — N179 Acute kidney failure, unspecified: Secondary | ICD-10-CM | POA: Diagnosis not present

## 2020-04-04 DIAGNOSIS — U071 COVID-19: Secondary | ICD-10-CM | POA: Diagnosis not present

## 2020-04-04 DIAGNOSIS — J9601 Acute respiratory failure with hypoxia: Secondary | ICD-10-CM | POA: Diagnosis not present

## 2020-04-04 DIAGNOSIS — N189 Chronic kidney disease, unspecified: Secondary | ICD-10-CM | POA: Diagnosis not present

## 2020-04-04 DIAGNOSIS — R4182 Altered mental status, unspecified: Secondary | ICD-10-CM | POA: Diagnosis not present

## 2020-04-05 DIAGNOSIS — U071 COVID-19: Secondary | ICD-10-CM | POA: Diagnosis not present

## 2020-04-06 DEATH — deceased

## 2020-05-07 IMAGING — US US CAROTID DUPLEX BILAT
1 series · 13 of 24 positions shown · non-contrast
Comparison: CT scan of the head 11/19/2018

CLINICAL DATA: 83-year-old male with stroke-like symptoms

EXAM:
BILATERAL CAROTID DUPLEX ULTRASOUND
TECHNIQUE: Gray scale imaging, color Doppler and duplex ultrasound were
performed of bilateral carotid and vertebral arteries in the neck.

[Series 1: us carotid duplex bilat · 13 of 64 slices shown]
[im 1/64]
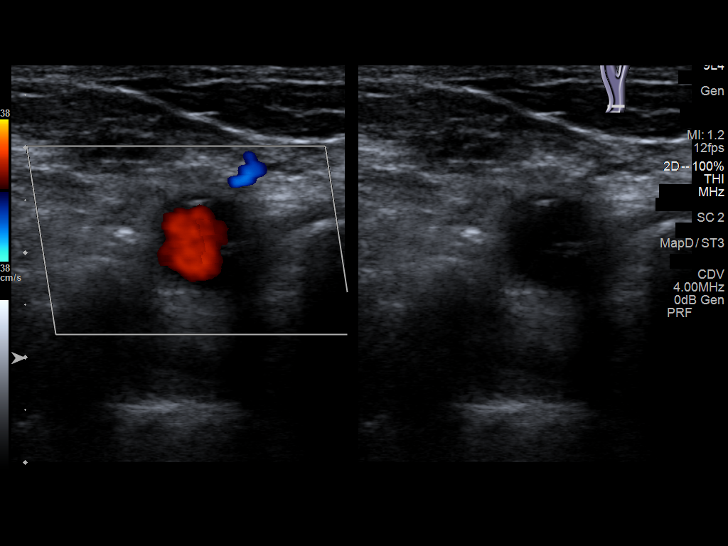
[im 6/64]
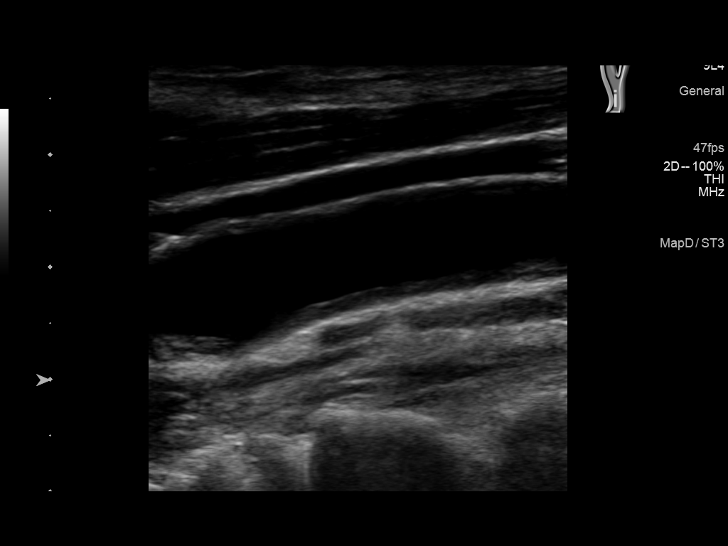
[im 11/64]
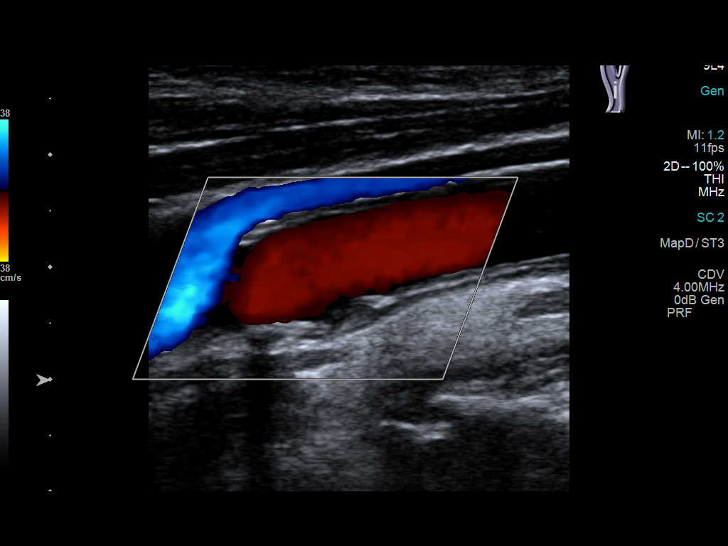
[im 17/64]
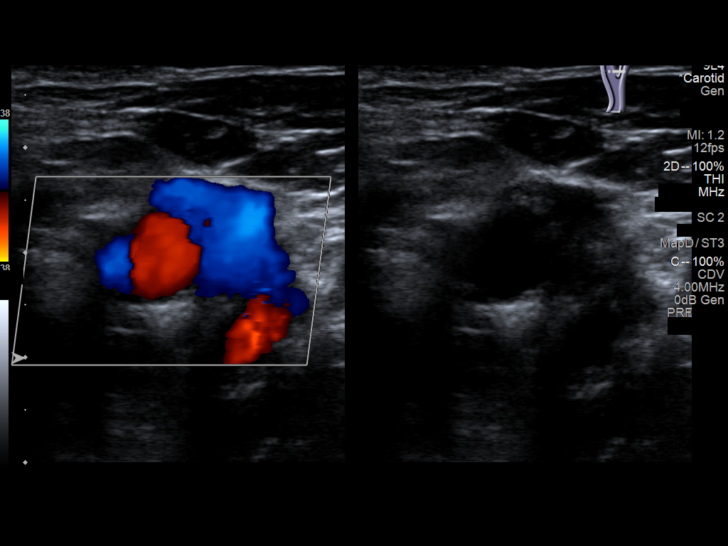
[im 22/64]
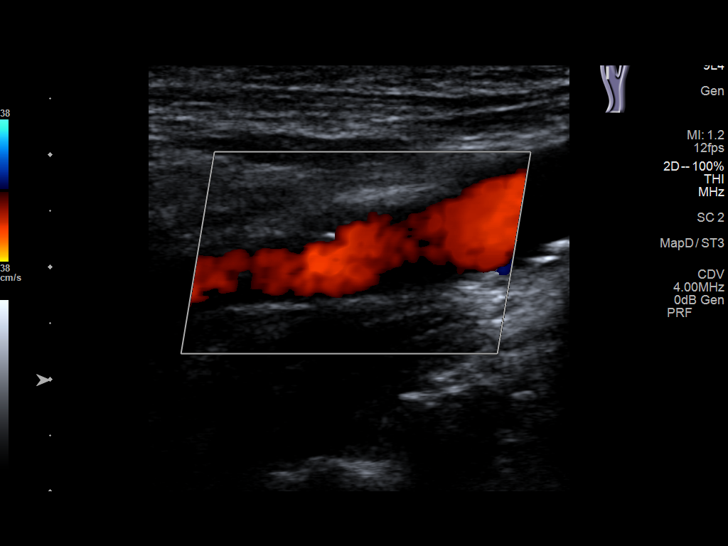
[im 28/64]
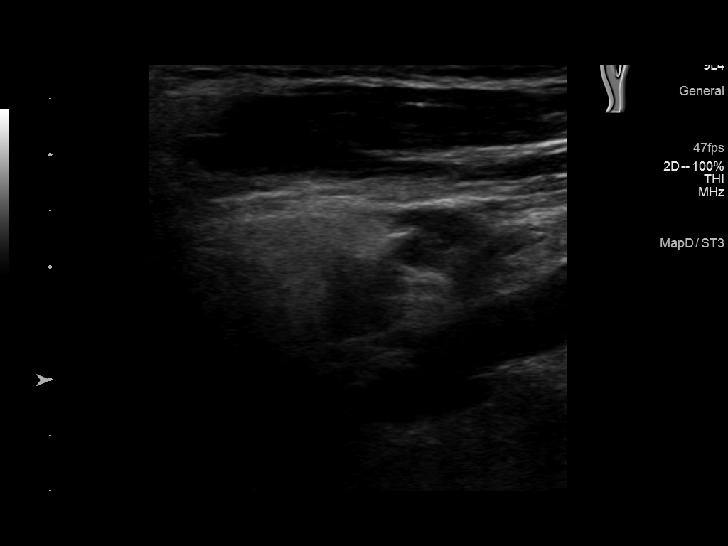
[im 33/64]
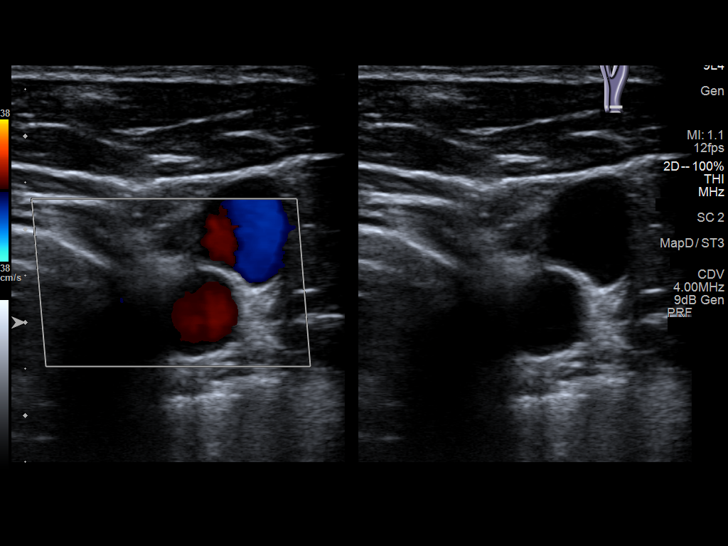
[im 36/64]
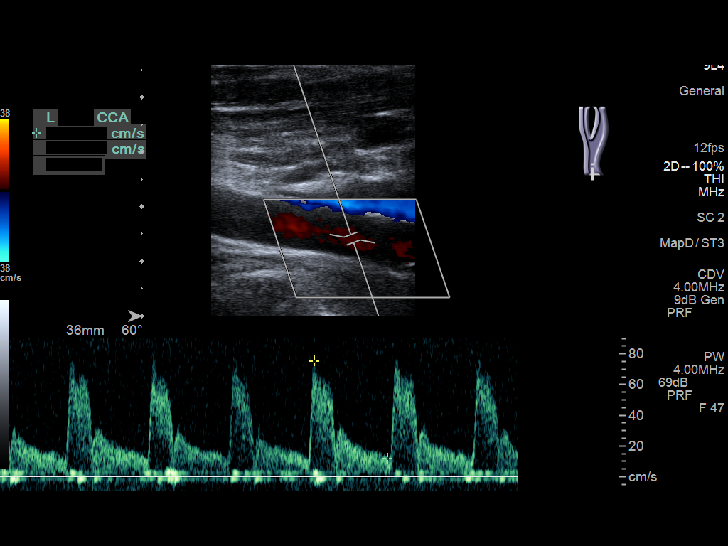
[im 42/64]
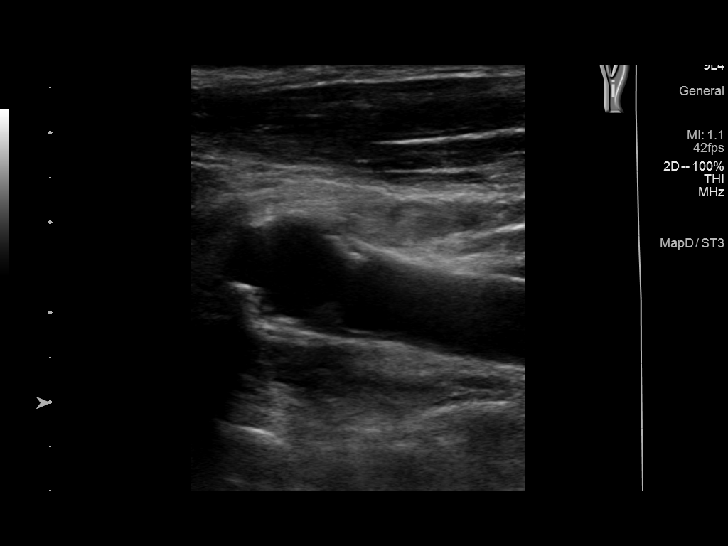
[im 47/64]
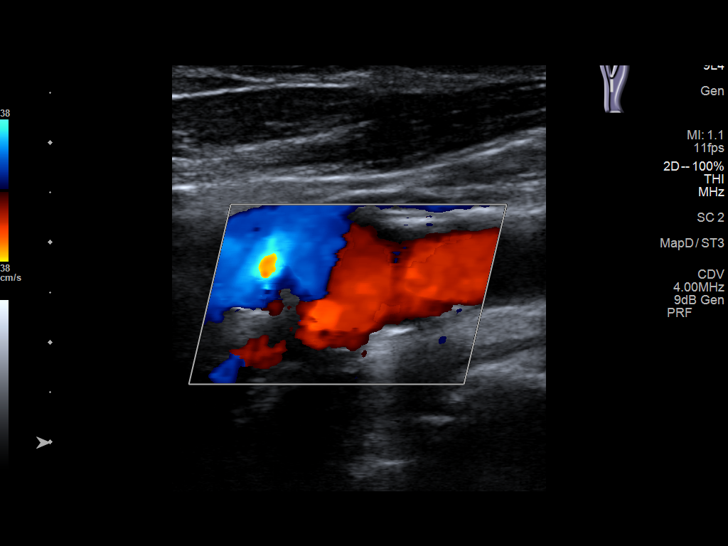
[im 53/64]
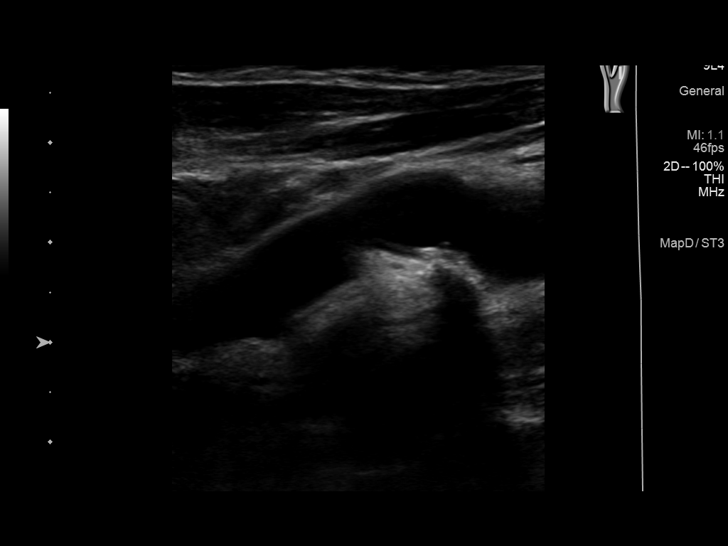
[im 58/64]
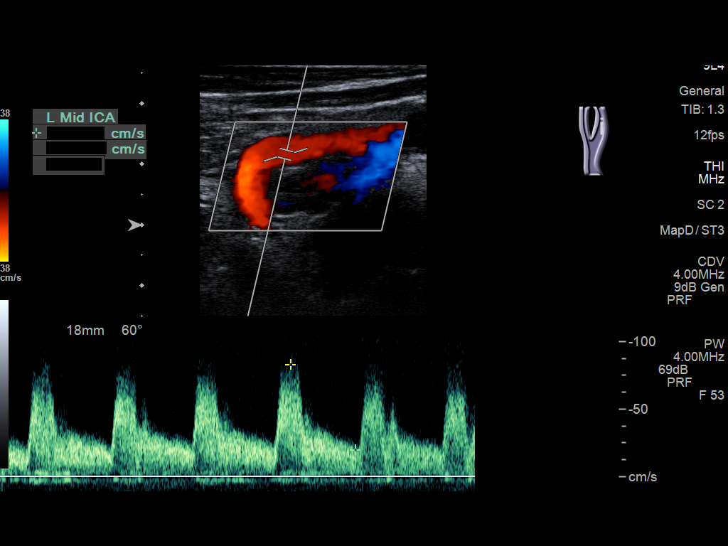
[im 64/64]
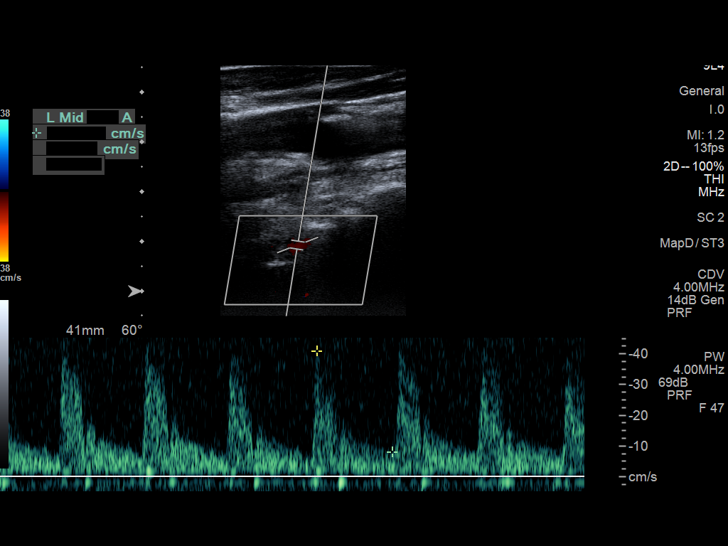

[13 of 24 positions shown; findings below may reference images not displayed]

FINDINGS: Criteria: Quantification of carotid stenosis is based on velocity
parameters that correlate the residual internal carotid diameter
with NASCET-based stenosis levels, using the diameter of the distal
internal carotid lumen as the denominator for stenosis measurement.

The following velocity measurements were obtained:

RIGHT
ICA: 85/27 cm/sec
CCA: 68/8 cm/sec

SYSTOLIC ICA/CCA RATIO:

ECA:  193 cm/sec

LEFT

ICA: 83/20 cm/sec

CCA: 67/17 cm/sec

SYSTOLIC ICA/CCA RATIO:

ECA:  134 cm/sec

RIGHT CAROTID ARTERY: Mild smooth heterogeneous atherosclerotic
plaque. By peak systolic velocity criteria, the stenosis remains
less than 50%.

RIGHT VERTEBRAL ARTERY:  Patent with normal antegrade flow.

LEFT CAROTID ARTERY: Mild focal heterogeneous atherosclerotic plaque
in the proximal internal carotid artery. By peak systolic velocity
criteria, the estimated stenosis remains less than 50%.

LEFT VERTEBRAL ARTERY:  Patent with normal antegrade flow.
IMPRESSION: 1. Mild (1-49%) stenosis proximal right internal carotid artery
secondary to mild smooth heterogeneous atherosclerotic plaque.
2. Mild (1-49%) stenosis proximal left internal carotid artery
secondary to mild heterogeneous atherosclerotic plaque.
3. The vertebral arteries are patent with normal antegrade flow.

## 2021-01-21 IMAGING — US US RENAL
1 series · 14 of 25 positions shown · non-contrast
Comparison: None available.

CLINICAL DATA: Initial evaluation for stage III A chronic kidney
disease, worsening renal function.

EXAM:
RENAL / URINARY TRACT ULTRASOUND COMPLETE

[Series 1: us renal · 0.26mm/px · 14 of 35 slices shown]
[im 1/35]
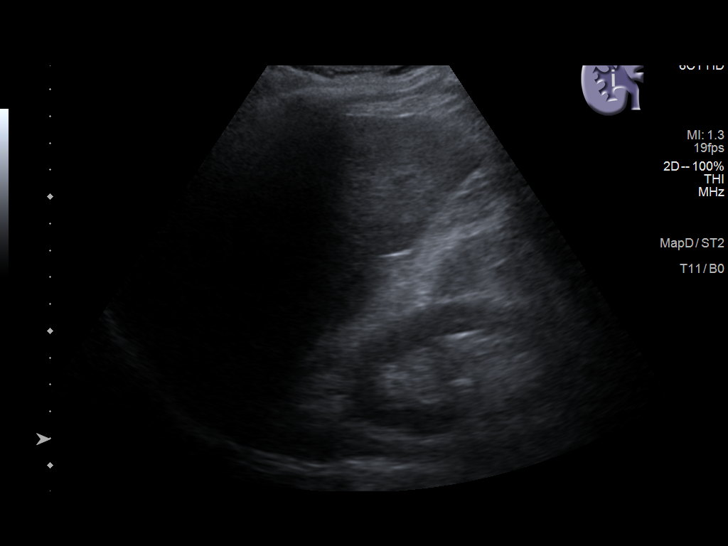
[im 3/35]
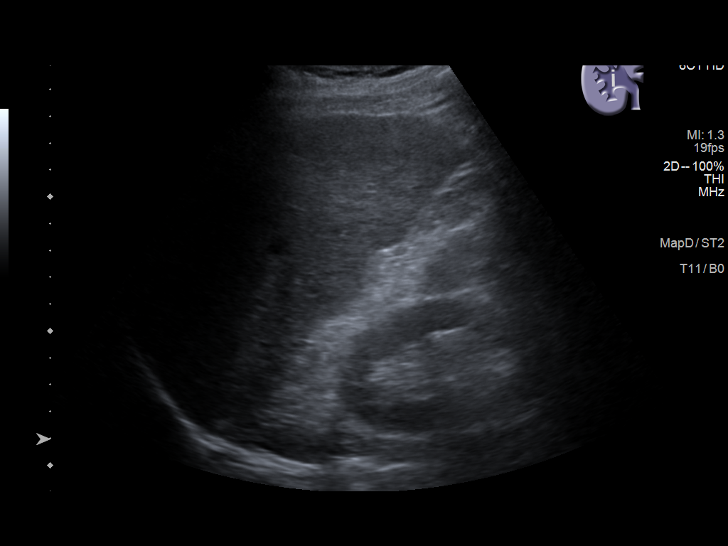
[im 6/35]
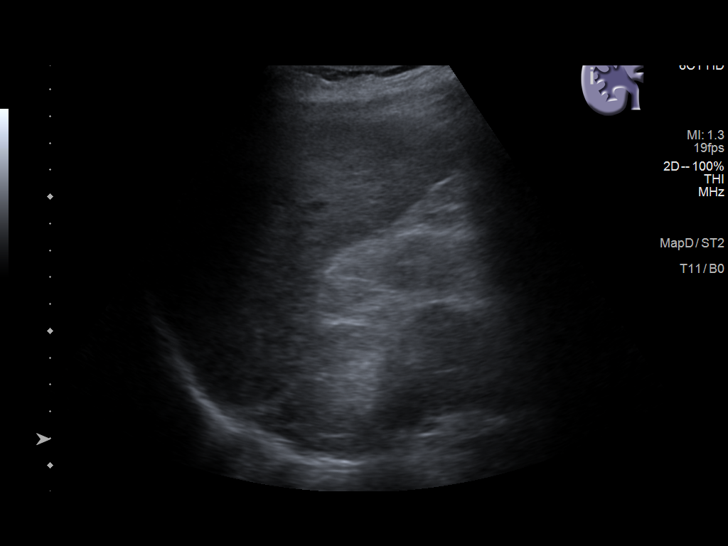
[im 9/35]
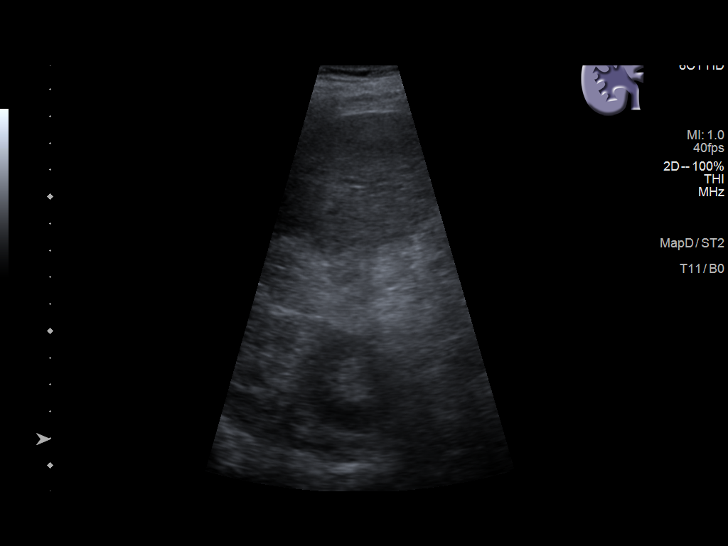
[im 12/35]
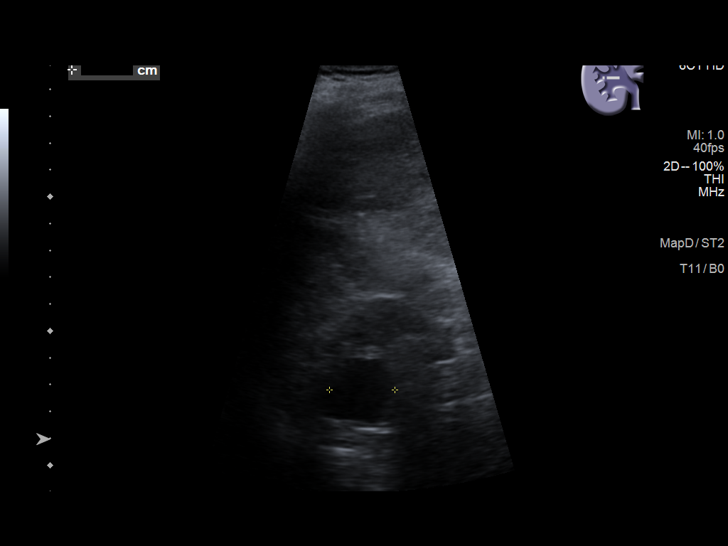
[im 13/35]
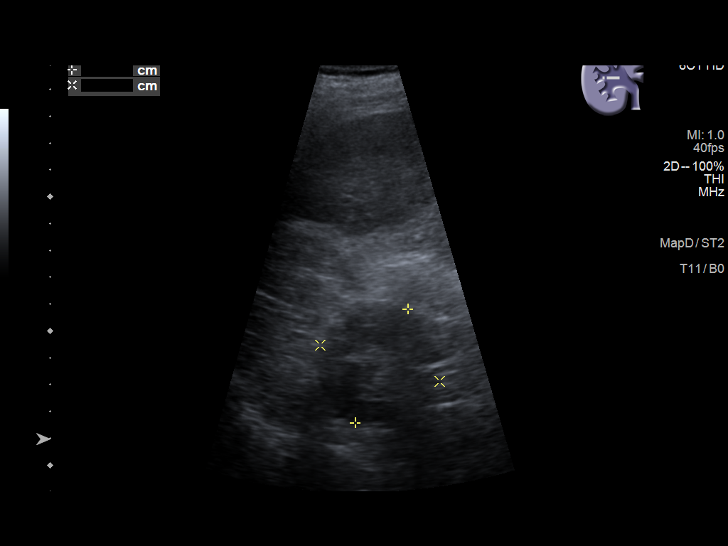
[im 16/35]
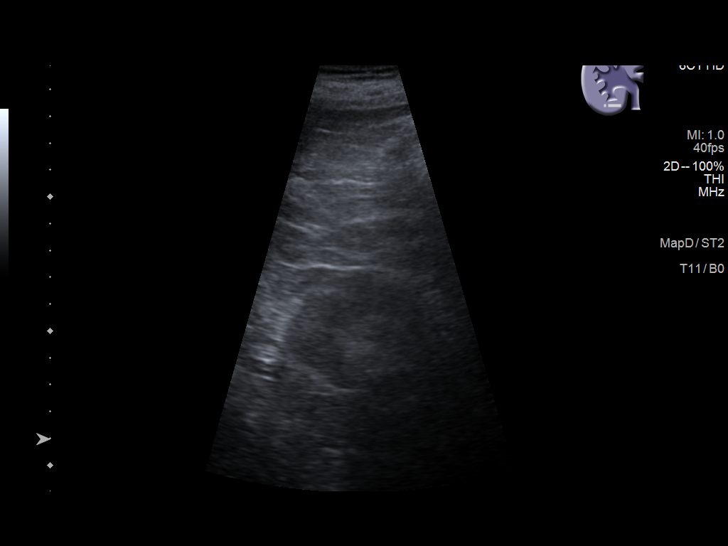
[im 19/35]
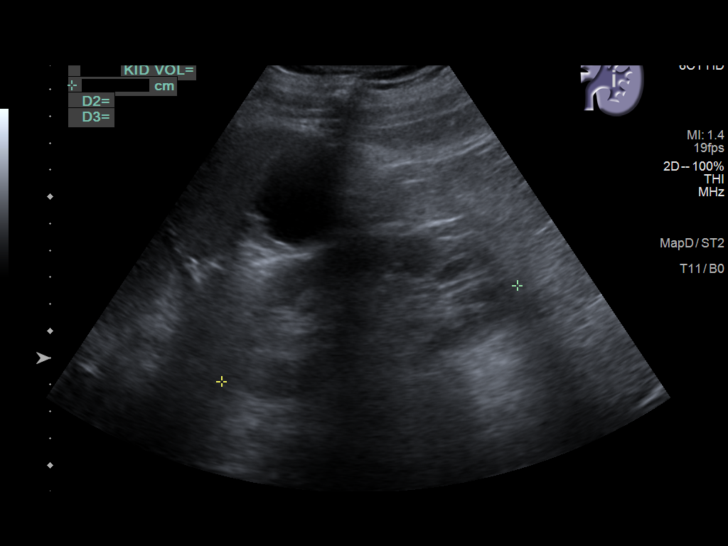
[im 22/35]
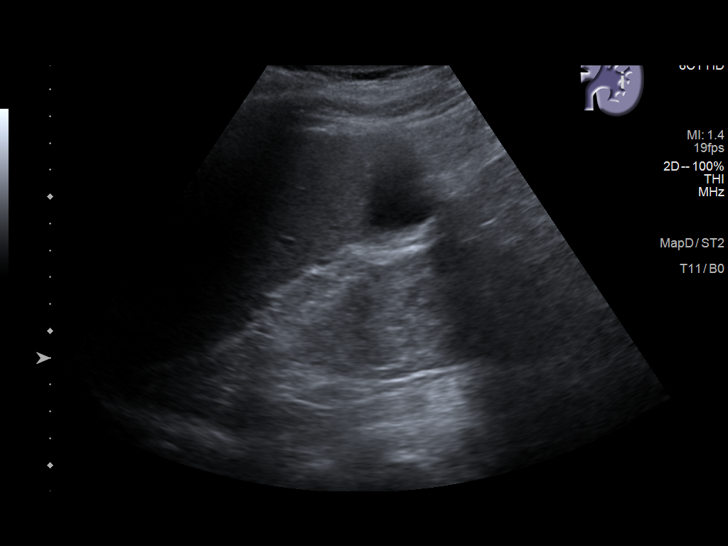
[im 23/35]
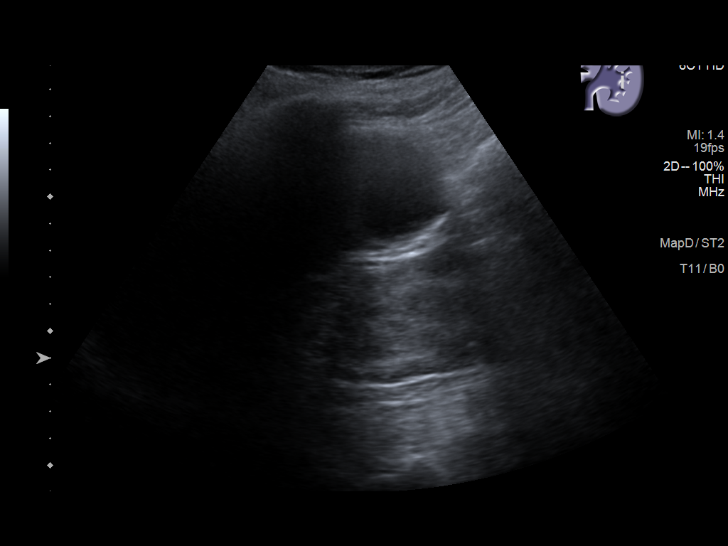
[im 26/35]
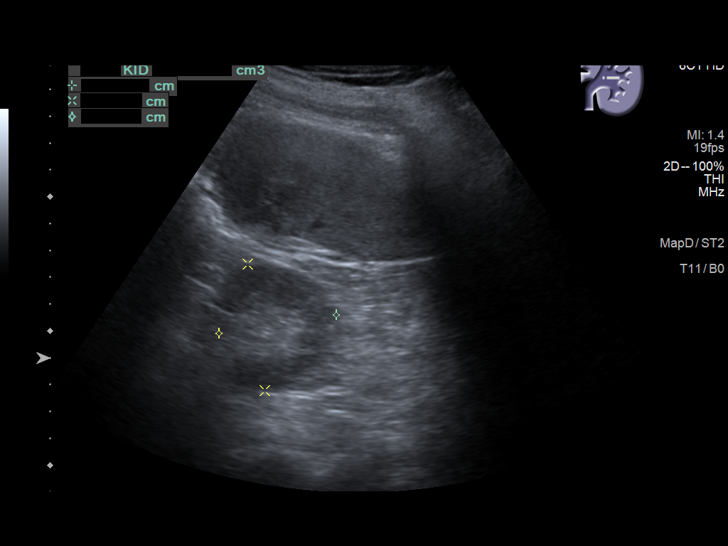
[im 29/35]
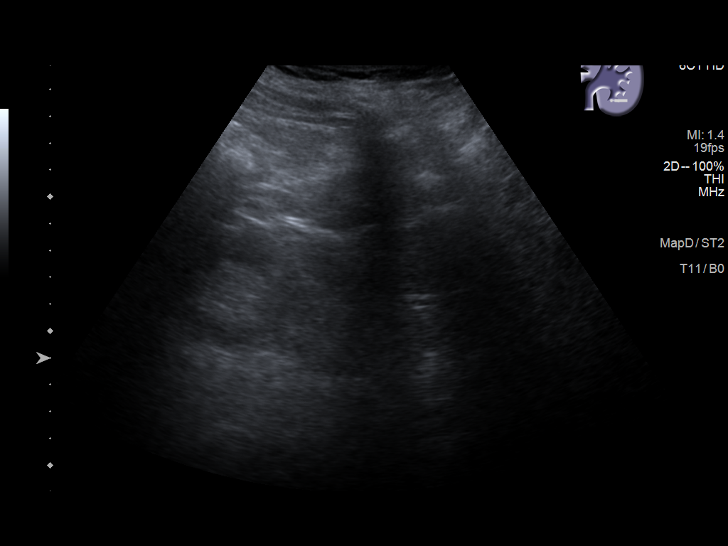
[im 32/35]
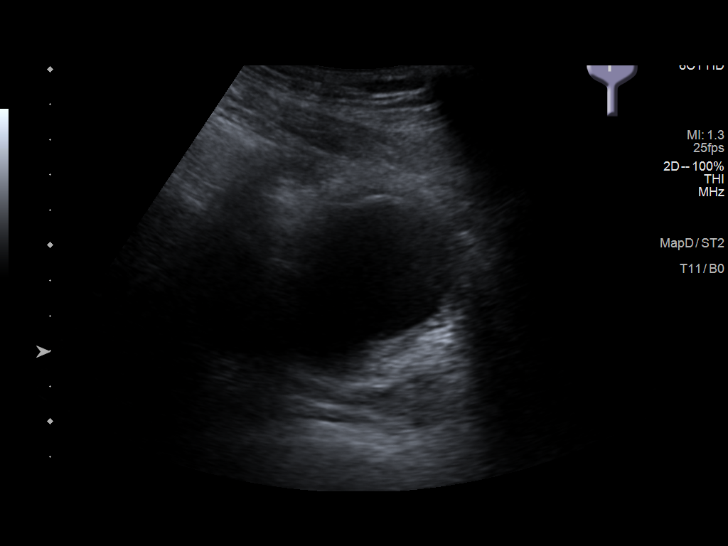
[im 35/35]
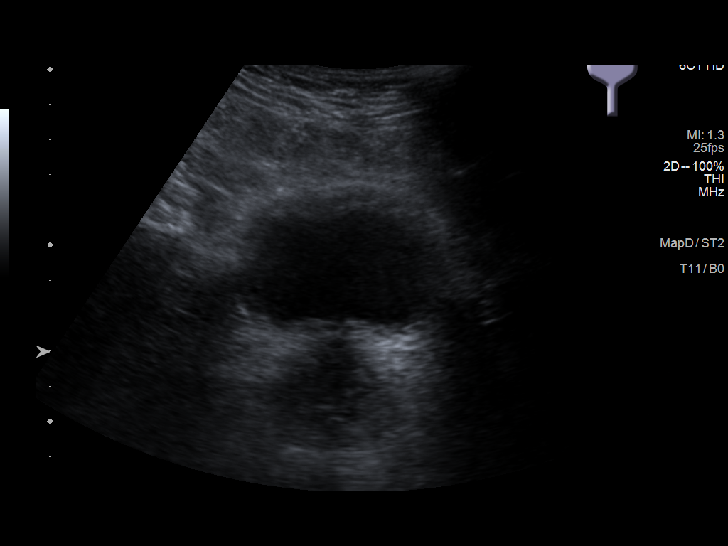

[14 of 25 positions shown; findings below may reference images not displayed]

FINDINGS: Right Kidney:

Renal measurements: 8.8 x 4.7 x 4.7 cm = volume: 101.1 mL. Mild
diffuse cortical thinning with increased echogenicity within the
renal parenchyma. No nephrolithiasis or hydronephrosis. 2.5 x 2.5 x
2.4 cm simple cyst noted at the upper pole.

Left Kidney:

Renal measurements: 11.6 x 4.8 x 4.4 cm = volume: 128 mL. Mildly
increased echogenicity within the renal parenchyma with mild diffuse
cortical thinning. No nephrolithiasis or hydronephrosis. No focal
renal mass.

Bladder:

Appears normal for degree of bladder distention.

Other:

Incidental note made of a simple cyst within the inferior aspect of
the spleen measuring up to 3.5 cm.
IMPRESSION: 1. Mild diffuse cortical thinning with increased echogenicity within
the renal parenchyma, compatible with chronic medical renal disease.
2. 2.5 cm simple cyst at the upper pole the right kidney.
3. Incidental 3.5 cm simple cyst within the spleen.
